# Patient Record
Sex: Female | Born: 1985 | Race: White | Hispanic: No | Smoking: Former smoker
Health system: Southern US, Community
[De-identification: ages and names within clinical notes are randomized; demographics above are authoritative.]

## PROBLEM LIST (undated history)

## (undated) DIAGNOSIS — D069 Carcinoma in situ of cervix, unspecified: Secondary | ICD-10-CM

## (undated) DIAGNOSIS — Z9889 Other specified postprocedural states: Secondary | ICD-10-CM

## (undated) DIAGNOSIS — F32A Depression, unspecified: Secondary | ICD-10-CM

## (undated) DIAGNOSIS — F319 Bipolar disorder, unspecified: Secondary | ICD-10-CM

## (undated) DIAGNOSIS — F419 Anxiety disorder, unspecified: Secondary | ICD-10-CM

## (undated) DIAGNOSIS — J45909 Unspecified asthma, uncomplicated: Secondary | ICD-10-CM

## (undated) DIAGNOSIS — O3442 Maternal care for other abnormalities of cervix, second trimester: Secondary | ICD-10-CM

## (undated) DIAGNOSIS — F329 Major depressive disorder, single episode, unspecified: Secondary | ICD-10-CM

## (undated) DIAGNOSIS — R87629 Unspecified abnormal cytological findings in specimens from vagina: Secondary | ICD-10-CM

## (undated) HISTORY — DX: Unspecified asthma, uncomplicated: J45.909

## (undated) HISTORY — DX: Anxiety disorder, unspecified: F41.9

## (undated) HISTORY — DX: Depression, unspecified: F32.A

## (undated) HISTORY — PX: CHOLECYSTECTOMY: SHX55

## (undated) HISTORY — PX: URETERAL STENT PLACEMENT: SHX822

## (undated) HISTORY — PX: TONSILLECTOMY: SUR1361

## (undated) HISTORY — DX: Unspecified abnormal cytological findings in specimens from vagina: R87.629

## (undated) HISTORY — DX: Major depressive disorder, single episode, unspecified: F32.9

## (undated) HISTORY — DX: Other specified postprocedural states: Z98.890

## (undated) HISTORY — DX: Maternal care for other abnormalities of cervix, second trimester: O34.42

## (undated) HISTORY — PX: TYMPANOSTOMY TUBE PLACEMENT: SHX32

---

## 2005-01-19 ENCOUNTER — Emergency Department: Payer: Self-pay | Admitting: Unknown Physician Specialty

## 2005-07-22 ENCOUNTER — Ambulatory Visit: Payer: Self-pay | Admitting: Unknown Physician Specialty

## 2005-07-24 ENCOUNTER — Observation Stay: Payer: Self-pay | Admitting: Obstetrics & Gynecology

## 2005-08-03 ENCOUNTER — Observation Stay: Payer: Self-pay

## 2005-09-01 ENCOUNTER — Observation Stay: Payer: Self-pay

## 2005-09-11 ENCOUNTER — Observation Stay: Payer: Self-pay

## 2005-09-16 ENCOUNTER — Ambulatory Visit: Payer: Self-pay | Admitting: Unknown Physician Specialty

## 2005-09-19 ENCOUNTER — Observation Stay: Payer: Self-pay | Admitting: Obstetrics & Gynecology

## 2005-10-03 ENCOUNTER — Inpatient Hospital Stay: Payer: Self-pay

## 2005-12-28 ENCOUNTER — Emergency Department: Payer: Self-pay | Admitting: Unknown Physician Specialty

## 2006-12-02 ENCOUNTER — Emergency Department: Payer: Self-pay | Admitting: Emergency Medicine

## 2006-12-02 IMAGING — CR DG ABDOMEN 3V
1 series · 4 of 4 positions shown · non-contrast
Comparison: none

REASON FOR EXAM: Pain
COMMENTS:

[Series 1: view not recorded · 0.17mm/px · 4 of 4 slices shown]
[im 1/4]
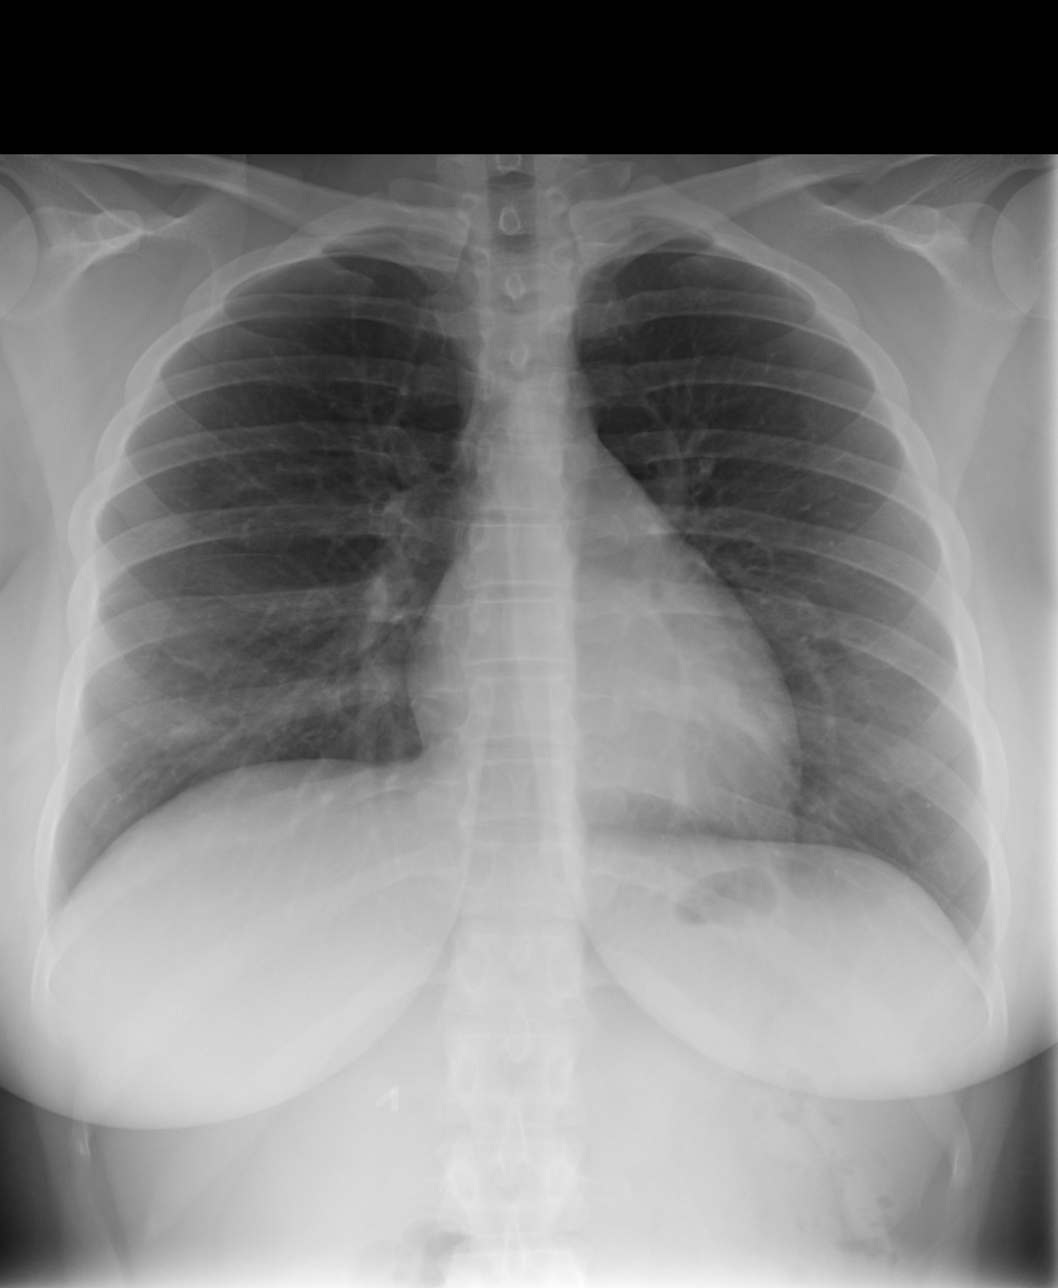
[im 2/4]
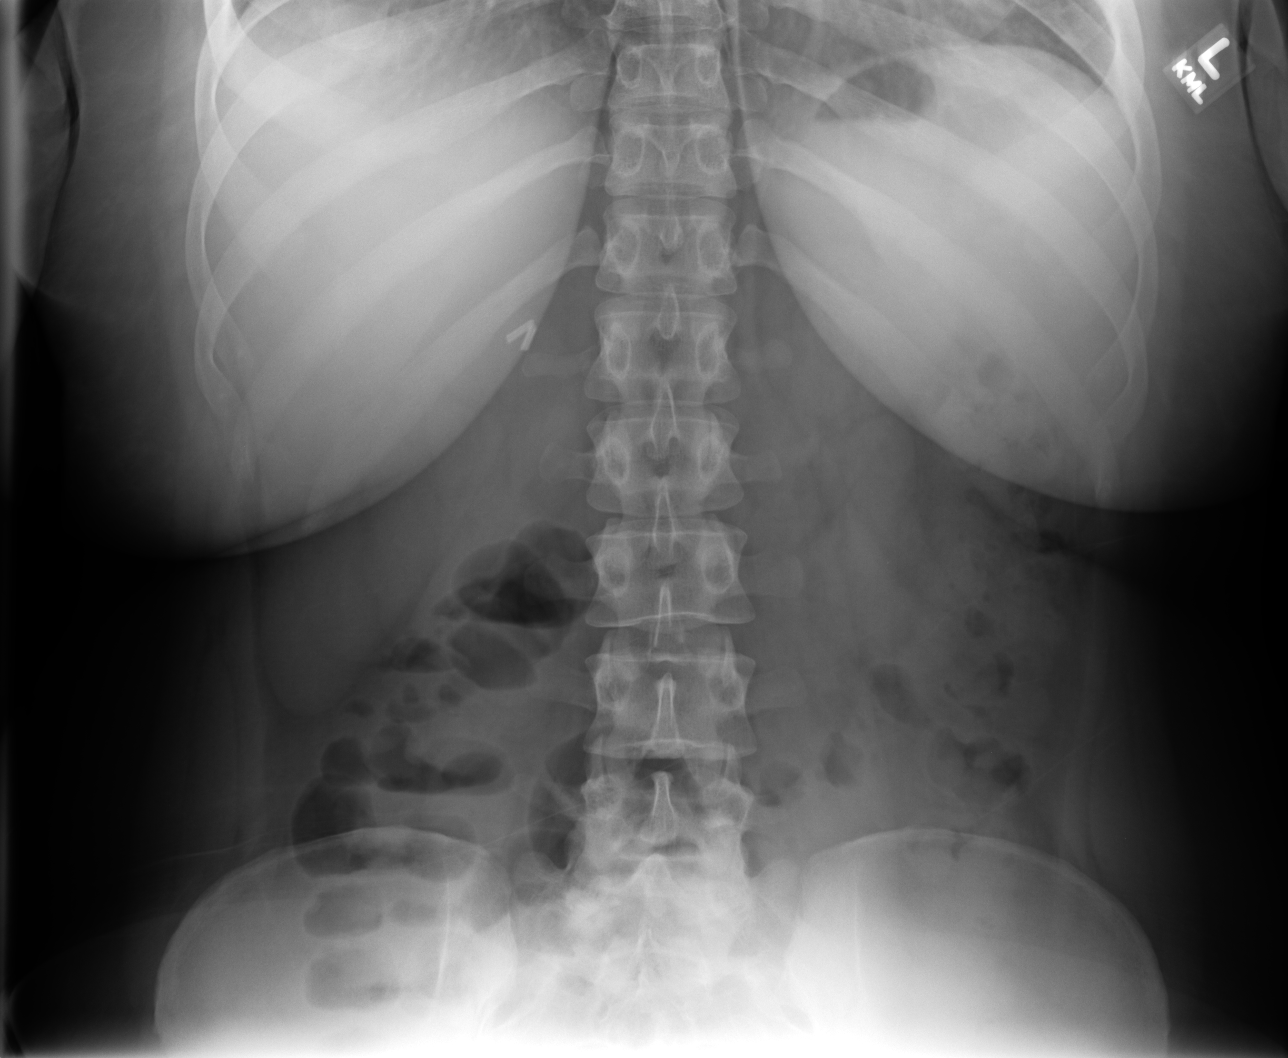
[im 3/4]
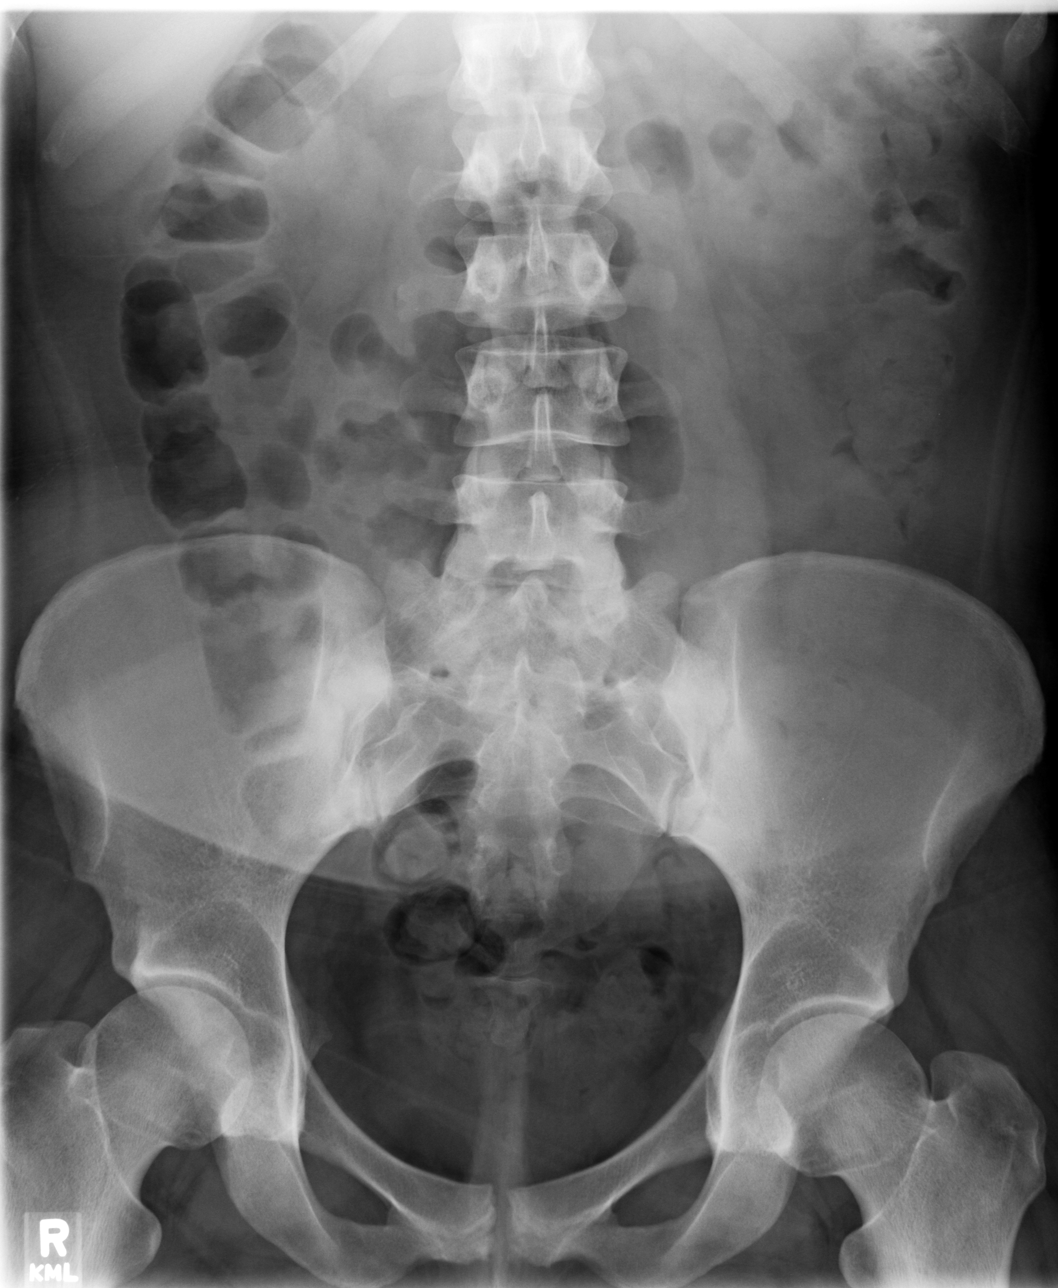
[im 4/4]
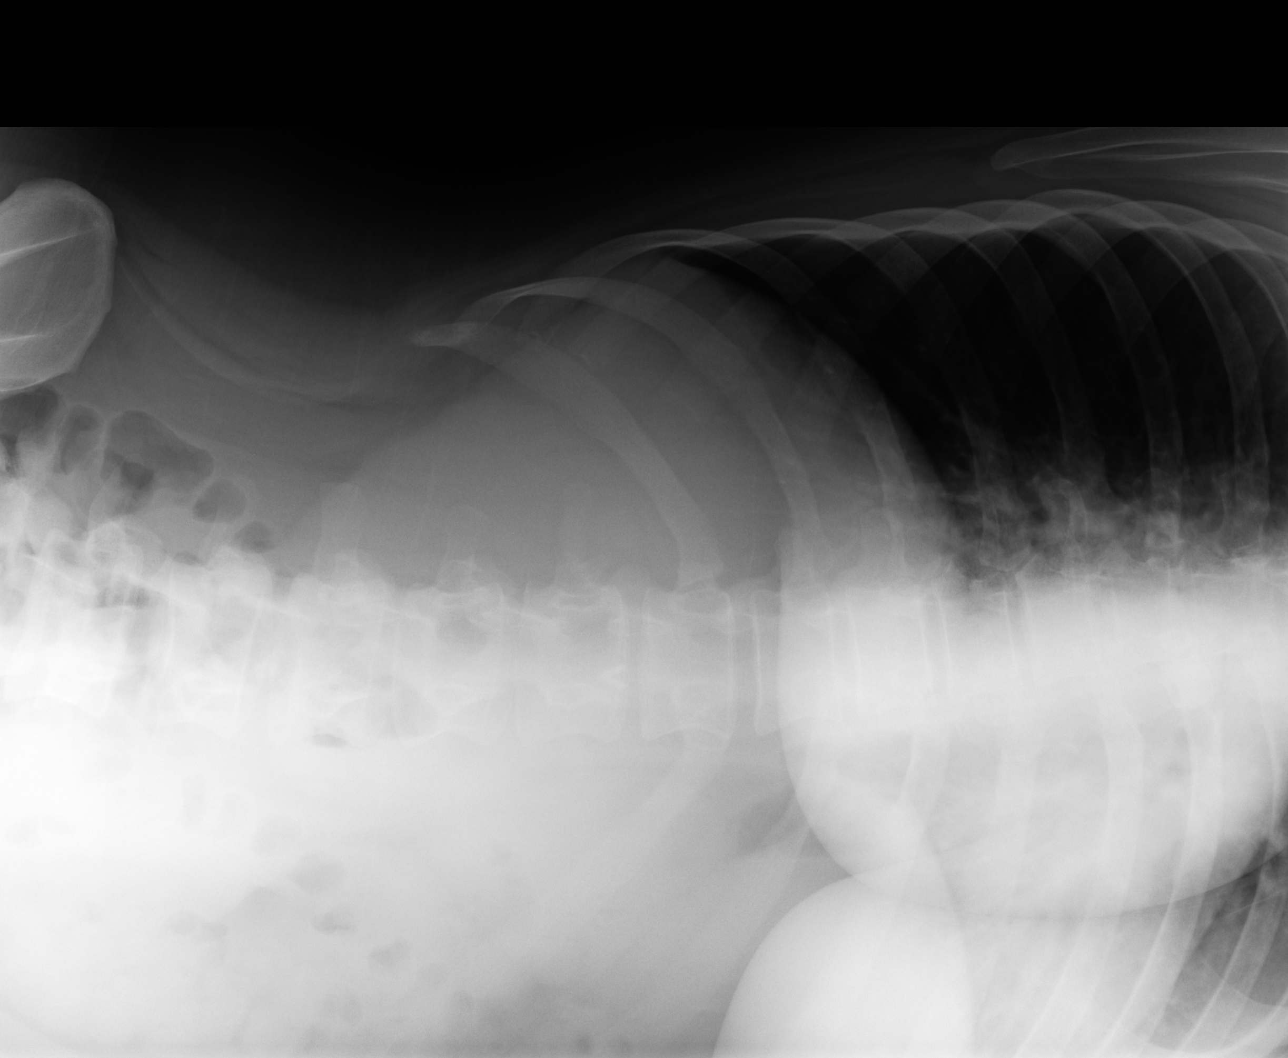

[4 of 4 positions shown; findings below may reference images not displayed]

PROCEDURE:     DXR - DXR ABDOMEN 3-WAY (INCL PA CXR)  - [DATE]  [DATE]

RESULT:     The patient is complaining of abdominal discomfort.

The chest film reveals the lungs to be adequately inflated and clear.  The
heart and mediastinal structures are within the limits of normal.  Views of
the abdomen reveal a normal bowel gas pattern.  There is no evidence of
obstruction or ileus or perforation.  There are surgical clips in the
gallbladder fossa.
IMPRESSION: I see no acute intraabdominal abnormality.

## 2007-06-05 ENCOUNTER — Emergency Department: Payer: Self-pay | Admitting: Emergency Medicine

## 2007-06-06 IMAGING — CT CT ABD-PELV W/ CM
1 of 2 series · 15 of 32 positions shown, 19 images · non-contrast
Comparison: none

REASON FOR EXAM: (1) PERIUMBILICAL PAIN; (2) PERIUMBILICAL PAIN
COMMENTS:

PROCEDURE:     CT  - CT ABDOMEN / PELVIS  W  - [DATE]  [DATE]
RESULT:
HISTORY: Periumbilical pain.

[Series 2: abd/pel w · axial · 0.84mm/px · z∈[-514,-58]mm · 15 of 63 slices shown, 19 images]
[im 3/63  soft-tissue]
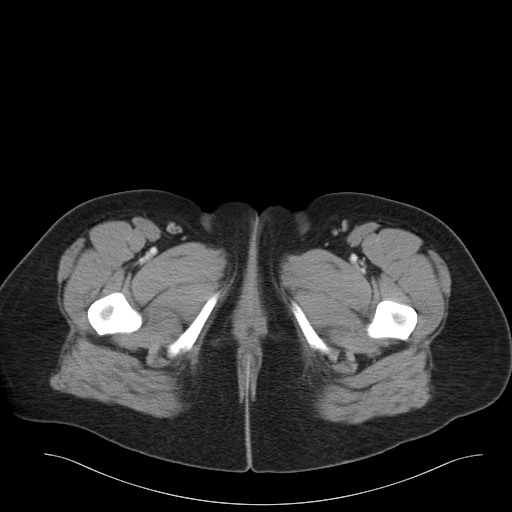
[im 3/63  bone]
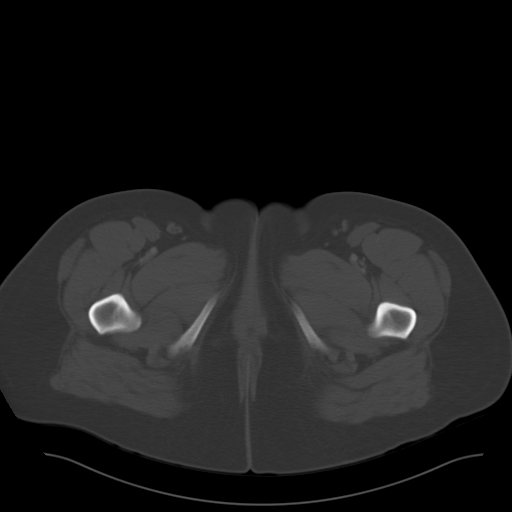
[im 8/63  soft-tissue]
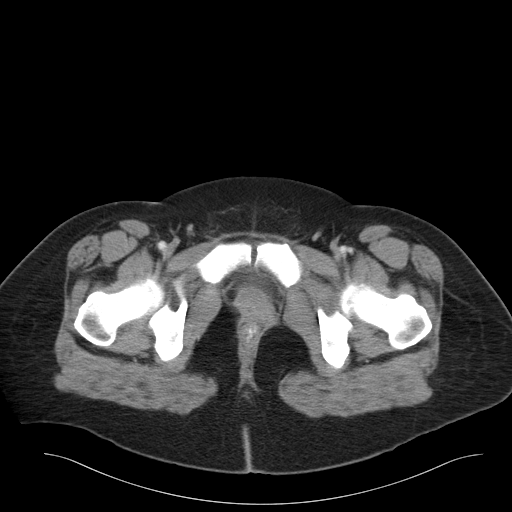
[im 13/63  soft-tissue]
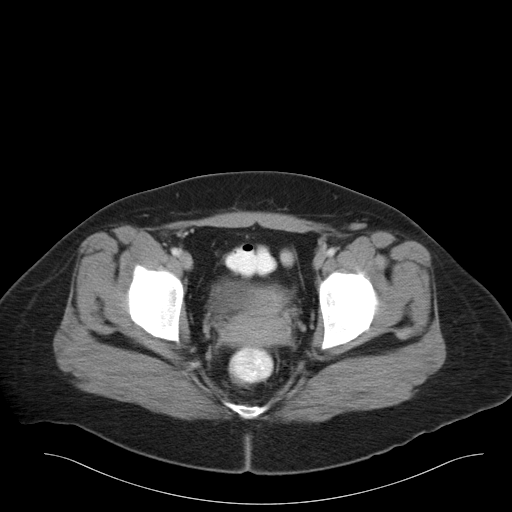
[im 19/63  soft-tissue]
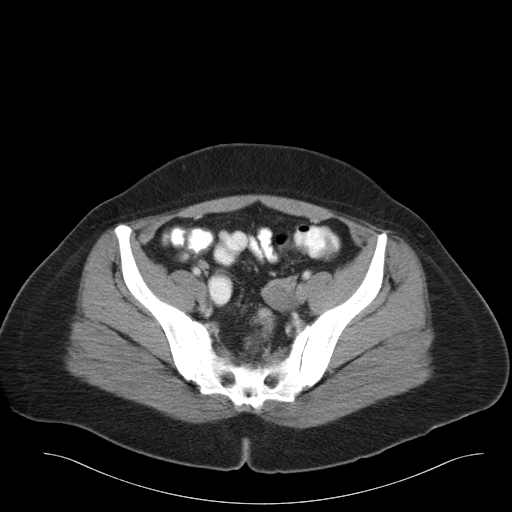
[im 21/63  soft-tissue]
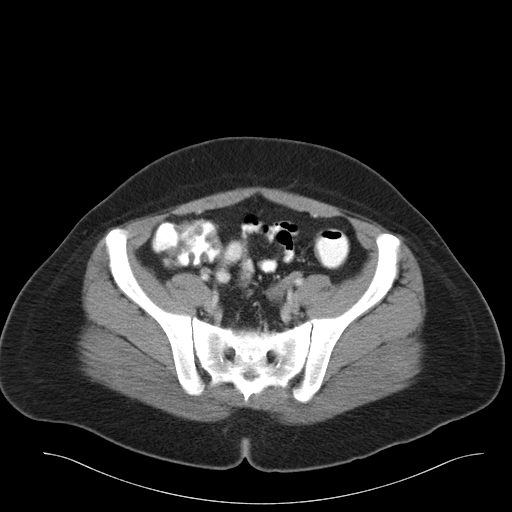
[im 26/63  soft-tissue]
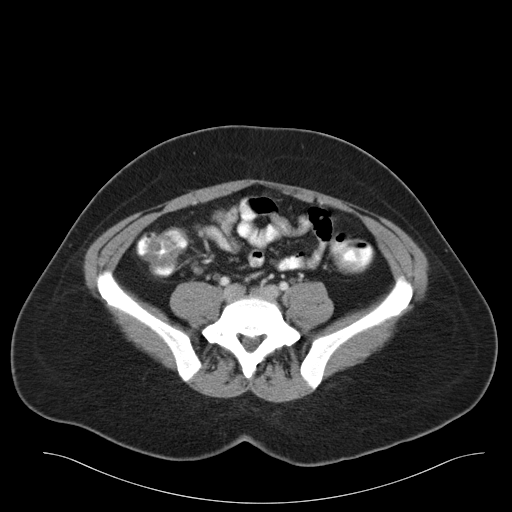
[im 32/63  soft-tissue]
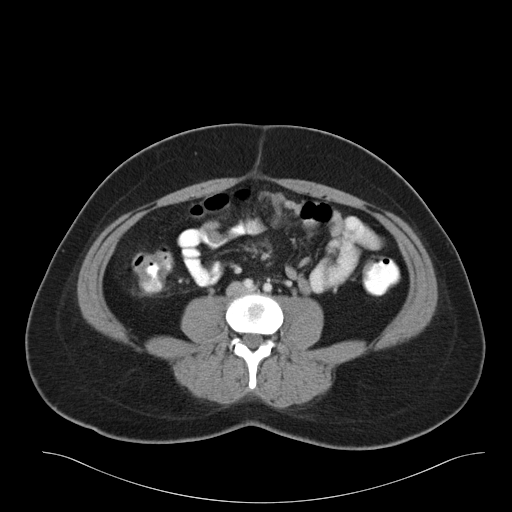
[im 37/63  soft-tissue]
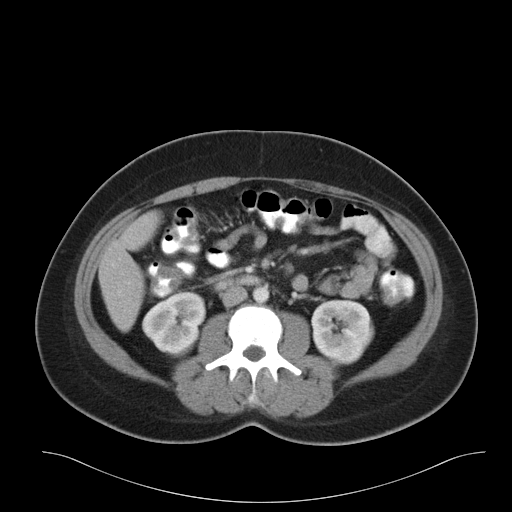
[im 42/63  soft-tissue]
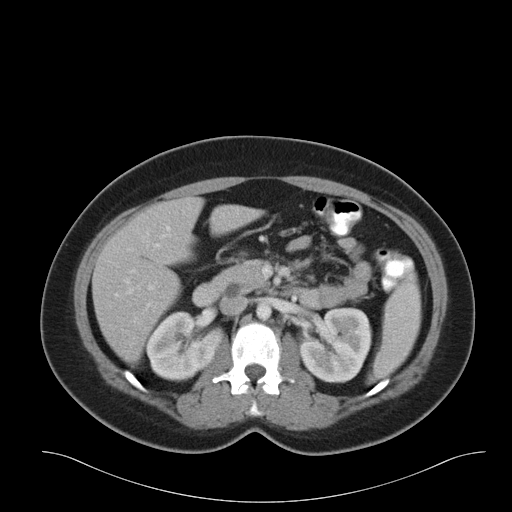
[im 42/63  bone]
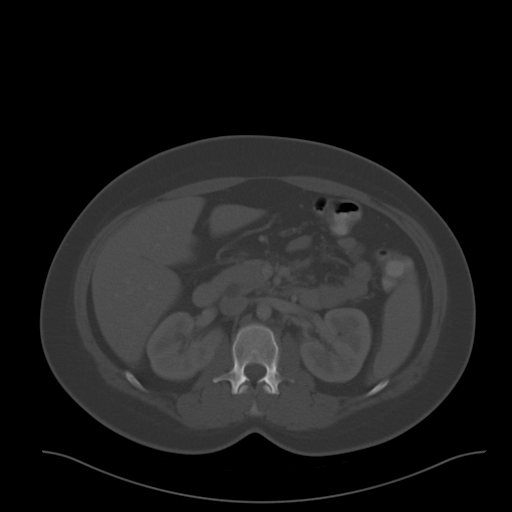
[im 44/63  soft-tissue]
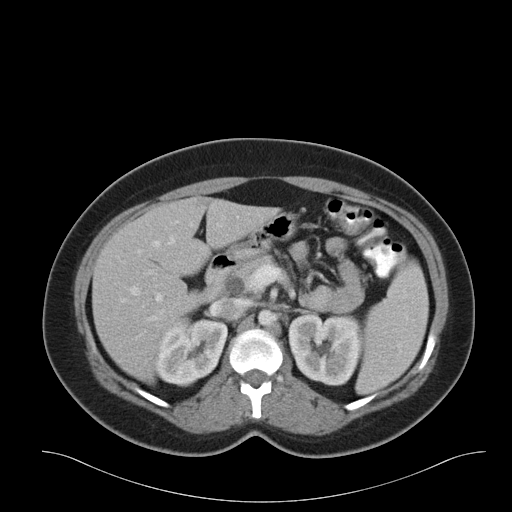
[im 50/63  soft-tissue]
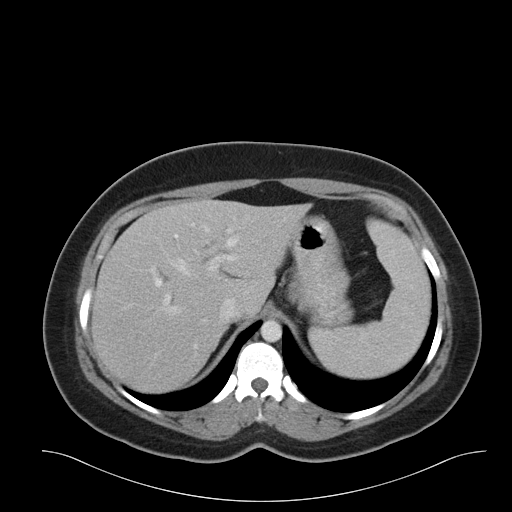
[im 52/63  lung]
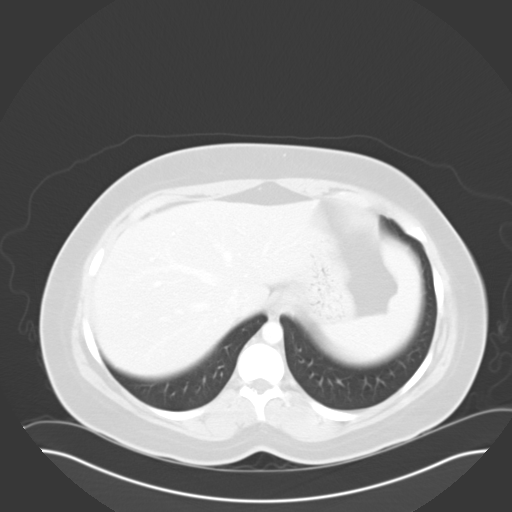
[im 55/63  soft-tissue]
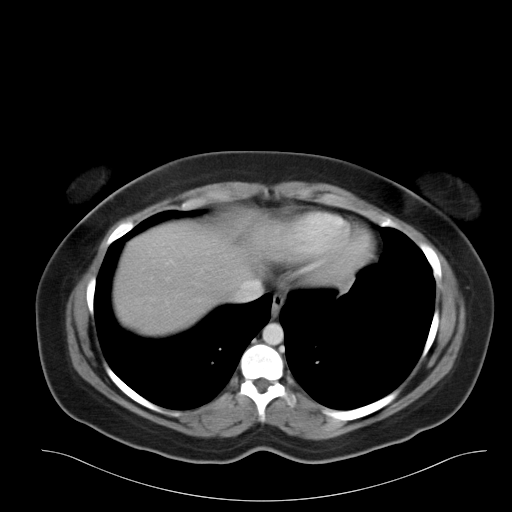
[im 55/63  lung]
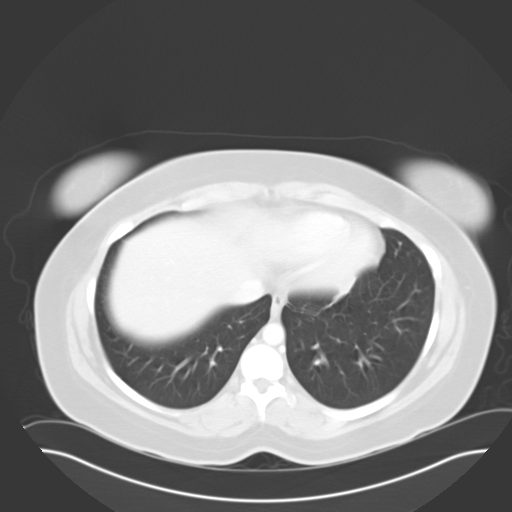
[im 57/63  lung]
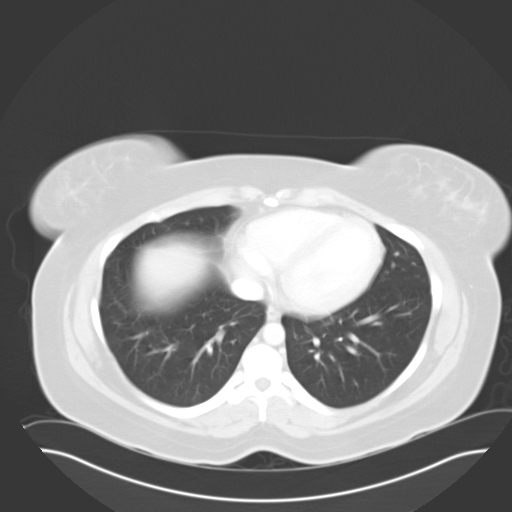
[im 60/63  soft-tissue]
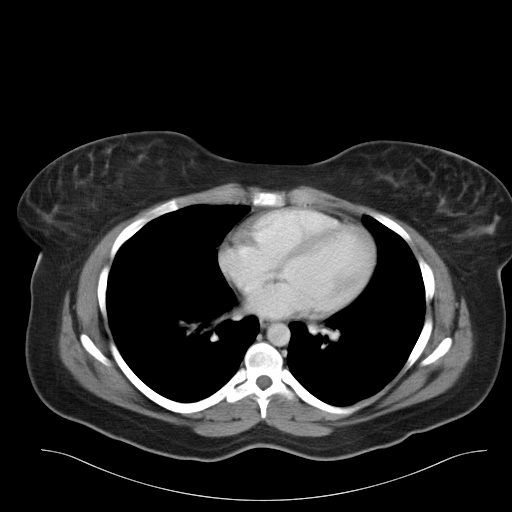
[im 60/63  lung]
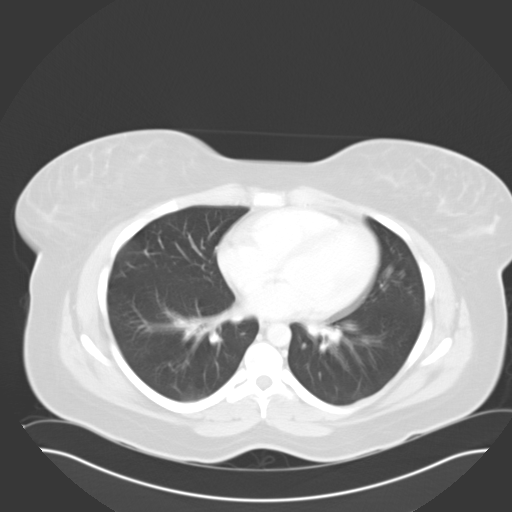

[15 of 32 positions shown; findings below may reference images not displayed]

COMPARISON STUDIES: No recent.

PROCEDURE AND FINDINGS: The liver and spleen are normal.  The pancreas is
normal. The patient has had a prior cholecystectomy. There is mild
prominence of the biliary system. Clinical correlation is suggested to
exclude hyperbilirubinemia. No focal renal abnormalities are noted. There is
no bowel distention.  The retroperitoneum is unremarkable.  No pancreatic
lesions are noted. The RIGHT lower quadrant is unremarkable.  The appendix
is poorly visualized.  Ileocecal valve is normal. The pericecal region is
normal.  Prominence of the RIGHT adnexa is noted.  Although this could be
appendical in nature RIGHT adnexal pathology cannot be excluded. If symptoms
persist pelvic ultrasound should be considered.  Pregnancy test should be
considered. The LEFT adnexa is also slightly prominent.  There is a trace
amount of fluid in the cul-de-sac.
IMPRESSION: 1)Bilateral adnexal mild prominence with trace amount of fluid in the
cul-de-sac.  Pregnancy test and further evaluation with pelvic ultrasound
can be obtained if clinically indicated.  The appendix is not well
visualized.

2)Prominence of the biliary system.  The patient has had a prior
cholecystectomy.  Clinical correlation is suggested to exclude
hyperbilirubinemia.  If need be we can perform MRCP and MRI of the pancreas
for further evaluation.

The initial report was faxed to the Emergency Room physician at the time of
the study.

## 2007-08-06 ENCOUNTER — Emergency Department: Payer: Self-pay | Admitting: Unknown Physician Specialty

## 2007-08-24 ENCOUNTER — Emergency Department: Payer: Self-pay | Admitting: Emergency Medicine

## 2008-02-10 ENCOUNTER — Emergency Department: Payer: Self-pay | Admitting: Emergency Medicine

## 2009-01-17 ENCOUNTER — Emergency Department: Payer: Self-pay | Admitting: Emergency Medicine

## 2010-03-09 ENCOUNTER — Emergency Department: Payer: Self-pay | Admitting: Emergency Medicine

## 2010-03-10 IMAGING — CT CT ABD-PELV W/O CM
1 of 2 series · 15 of 32 positions shown, 19 images · non-contrast
Comparison: none

REASON FOR EXAM: (1) epigastric pain radiating to back w/ vomiting; (2)
epigastric pain radiating
COMMENTS:

[Series 2: 3mm soft tissue · axial · 0.74mm/px · z∈[+335,+797]mm · 15 of 169 slices shown, 19 images]
[im 8/169  soft-tissue]
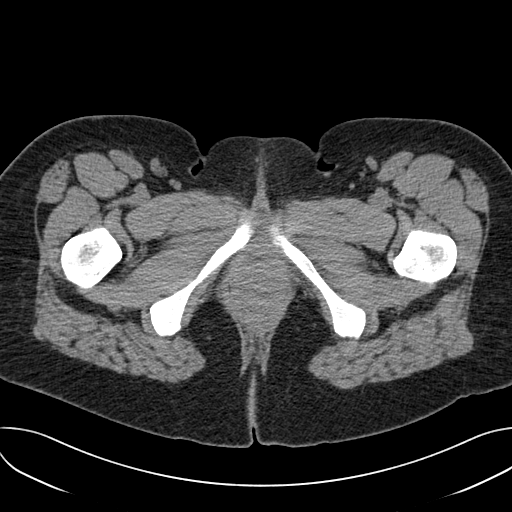
[im 8/169  bone]
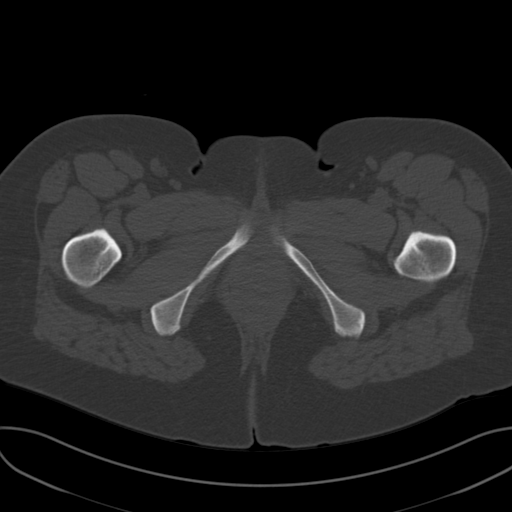
[im 22/169  soft-tissue]
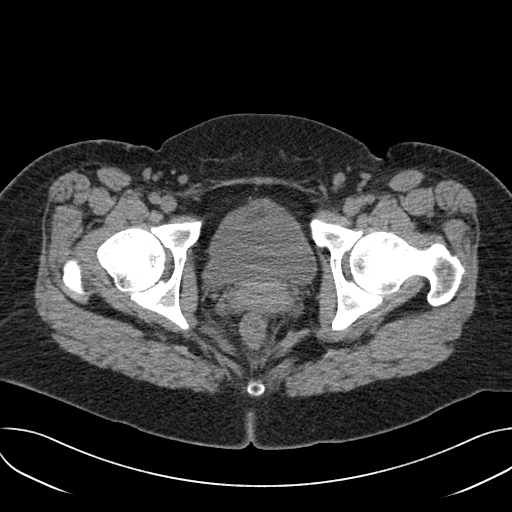
[im 36/169  soft-tissue]
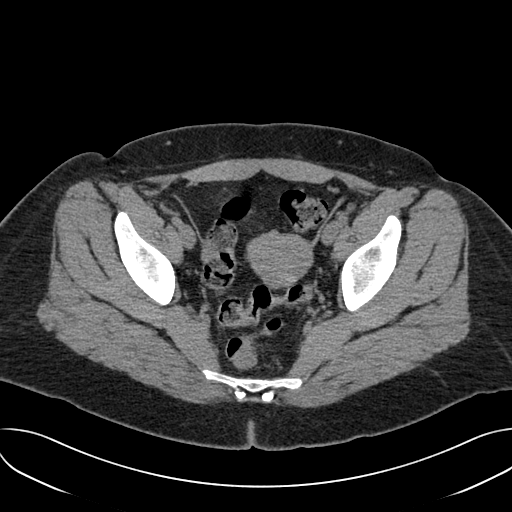
[im 50/169  soft-tissue]
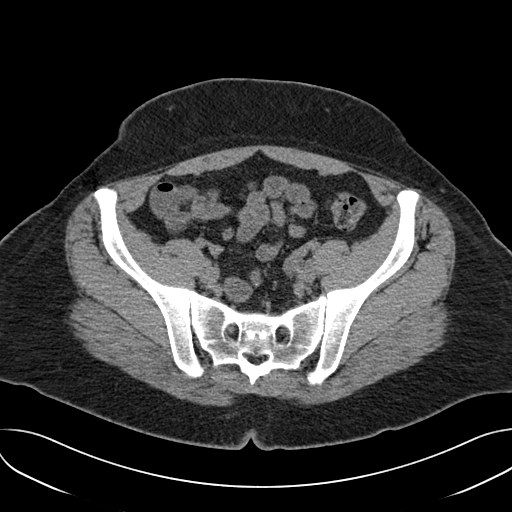
[im 57/169  soft-tissue]
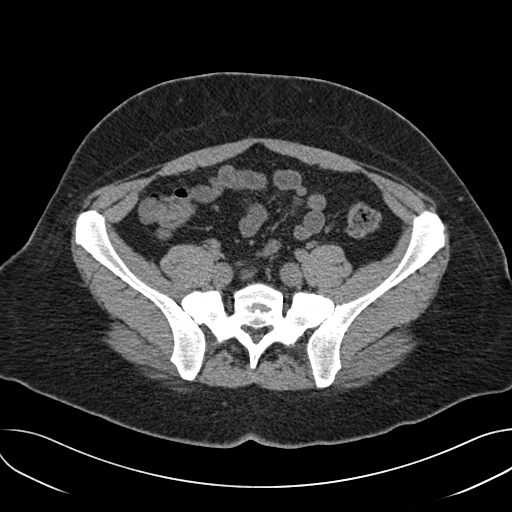
[im 71/169  soft-tissue]
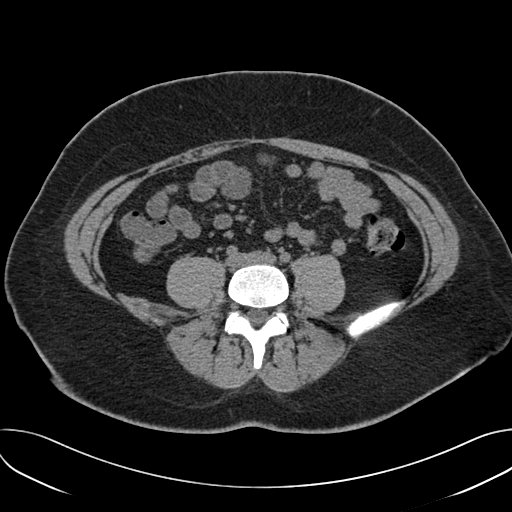
[im 85/169  soft-tissue]
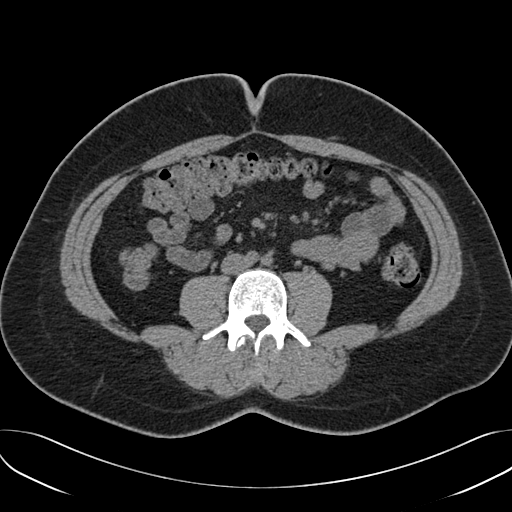
[im 99/169  soft-tissue]
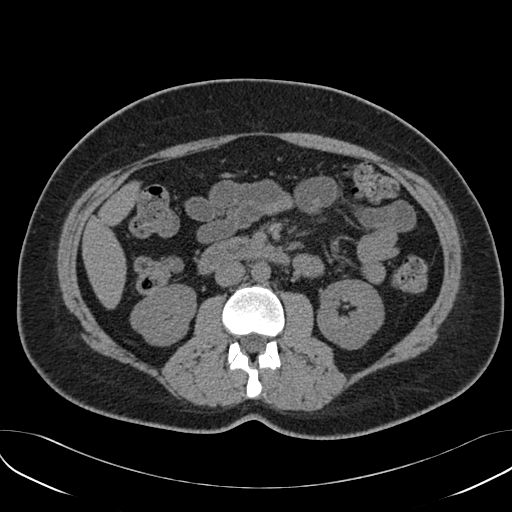
[im 113/169  soft-tissue]
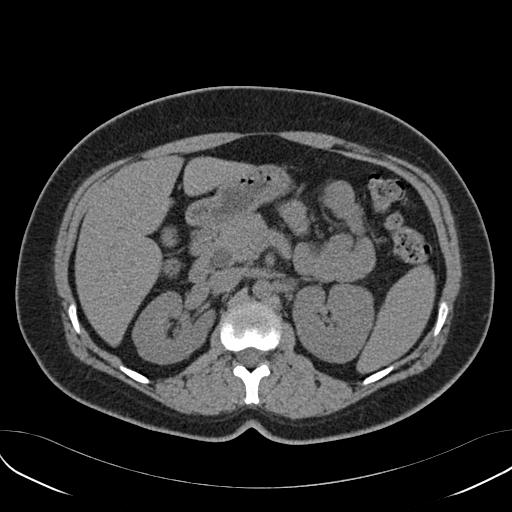
[im 113/169  bone]
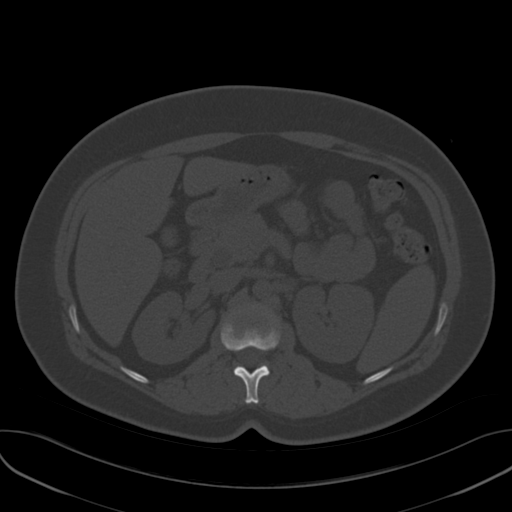
[im 120/169  soft-tissue]
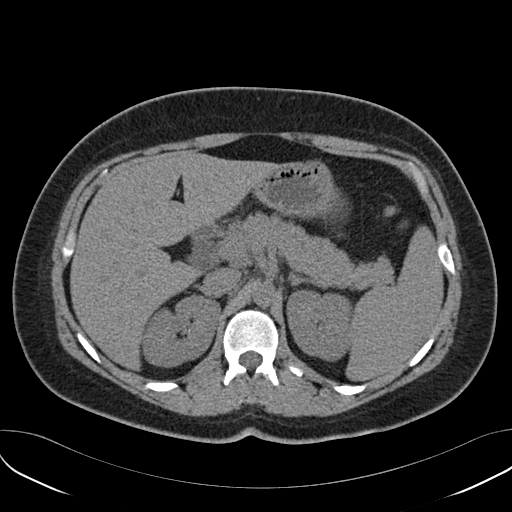
[im 134/169  soft-tissue]
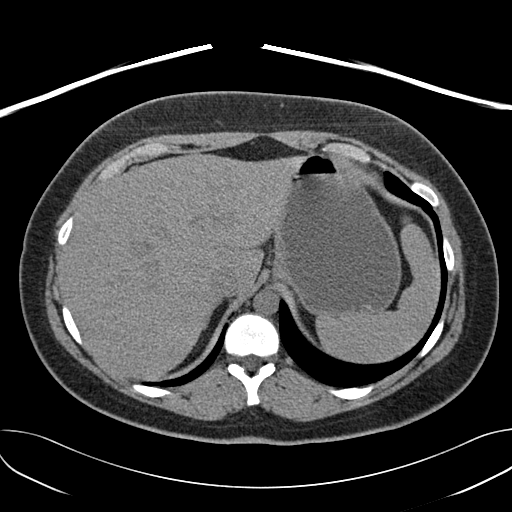
[im 141/169  lung]
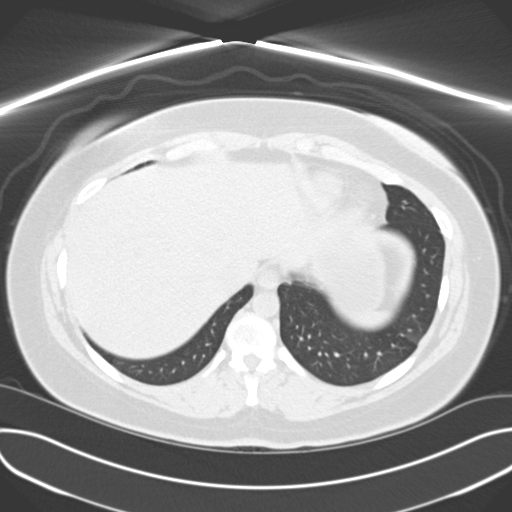
[im 148/169  soft-tissue]
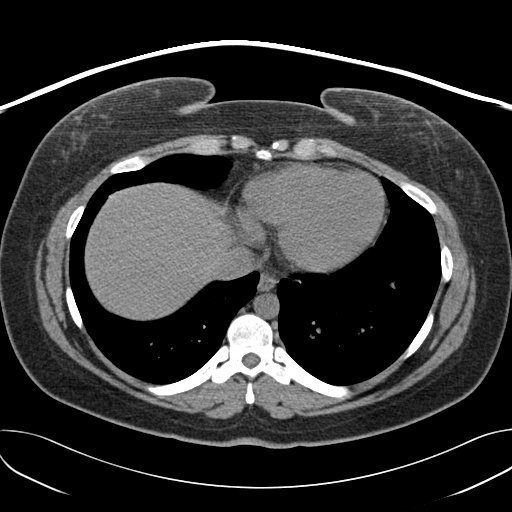
[im 148/169  lung]
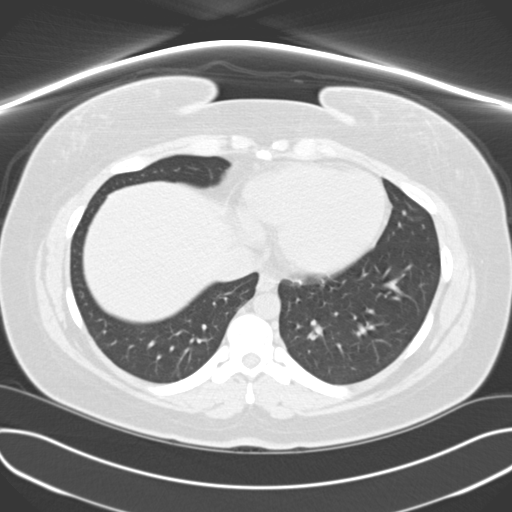
[im 155/169  lung]
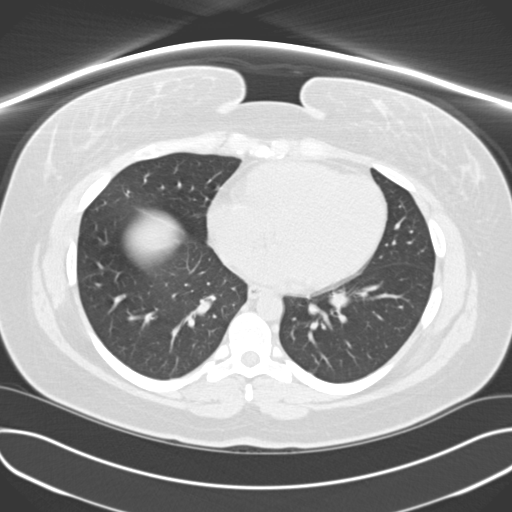
[im 162/169  soft-tissue]
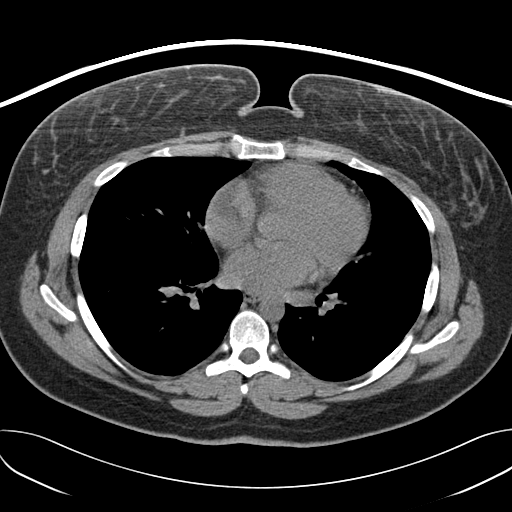
[im 162/169  lung]
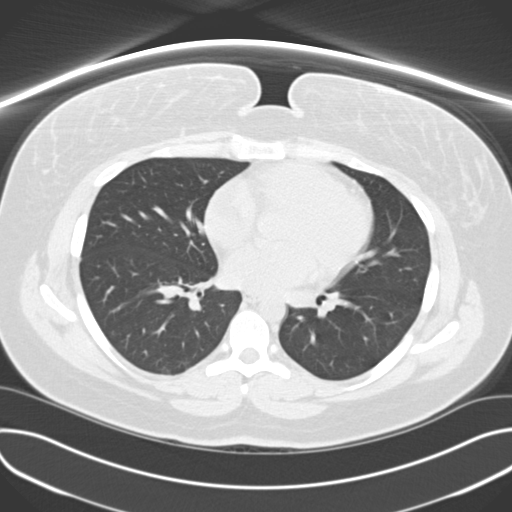

[15 of 32 positions shown; findings below may reference images not displayed]

PROCEDURE:     CT  - CT ABDOMEN AND PELVIS W[DATE]  [DATE]

RESULT:     Comparison is made to prior study dated [DATE].

Helical non-contrast 3 mm sections were obtained from the lung bases through
the pubic symphysis.

Evaluation of the lung bases demonstrates no gross abnormalities.

Non-contrast evaluation of the liver, spleen, adrenals, pancreas, and
kidneys is unremarkable. Small subcentimeter lymph nodes are appreciated
scattered throughout the mesentery. There is no evidence of an abdominal
aortic aneurysm. There is no evidence of bowel obstruction, nor secondary
signs reflecting enteritis, colitis, diverticulitis, nor appendicitis. There
is no CT evidence of renal calculi, ureteral calculi, hydronephrosis, nor
ureteral dilatation. A moderate amount of stool is appreciated throughout
the colon.
IMPRESSION: 1. Nonspecific finding of multiple small lymph nodes scattered throughout
the mesentery. In the proper clinical setting, mesenteric adenitis cannot be
excluded. Clinical correlation recommended.

Dr. LEEM of the Emergency Department was informed of these findings via a
preliminary faxed report dated [DATE] at [DATE] a.m. Central Time.

## 2011-04-18 ENCOUNTER — Emergency Department: Payer: Self-pay | Admitting: Internal Medicine

## 2011-04-18 IMAGING — CT CT ABD-PELV W/O CM
1 of 2 series · 14 of 32 positions shown, 18 images · non-contrast
Comparison: none

REASON FOR EXAM: (1) llq pain, r/o kidney stone or ovarian cyst; (2) as
above
COMMENTS:   LMP: Two weeks ago

[Series 2: stone · axial · 0.74mm/px · z∈[-696,-270]mm · 14 of 162 slices shown, 18 images]
[im 13/162  soft-tissue]
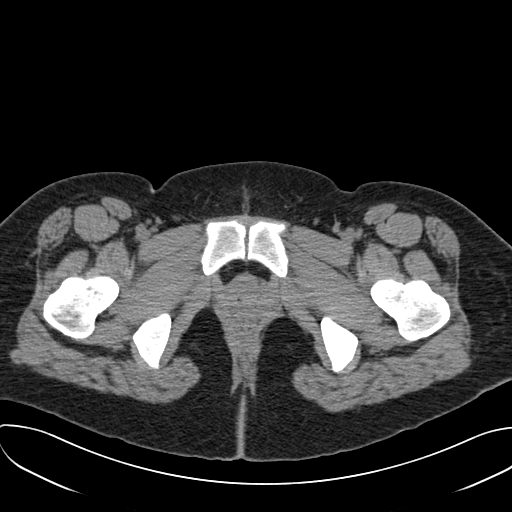
[im 13/162  bone]
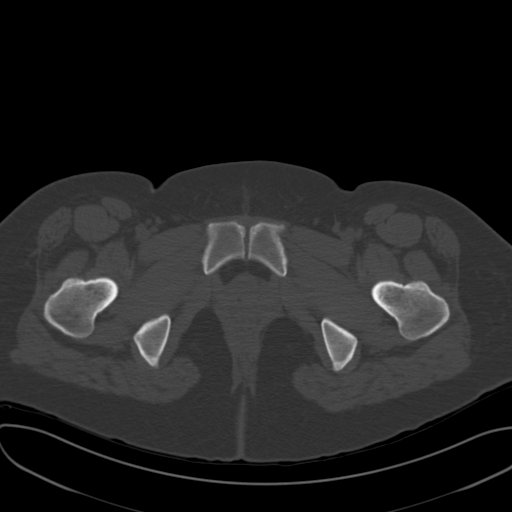
[im 25/162  soft-tissue]
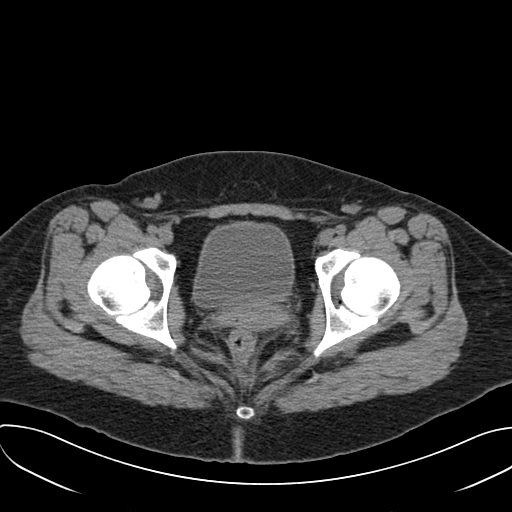
[im 38/162  soft-tissue]
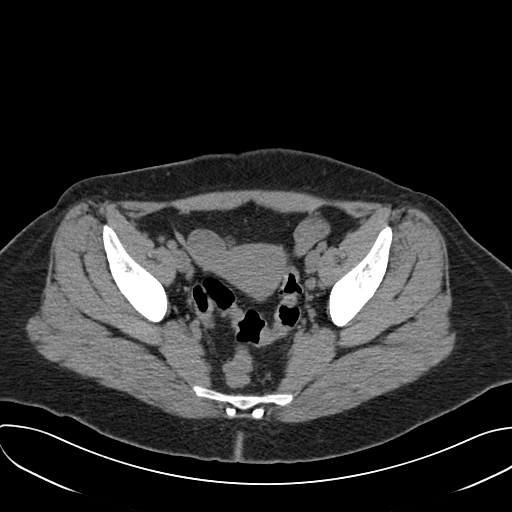
[im 50/162  soft-tissue]
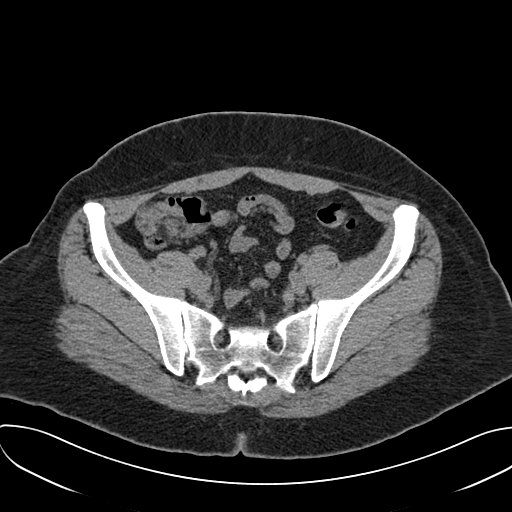
[im 62/162  soft-tissue]
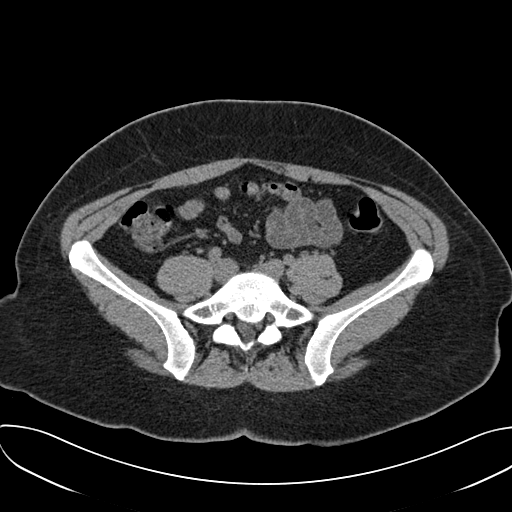
[im 75/162  soft-tissue]
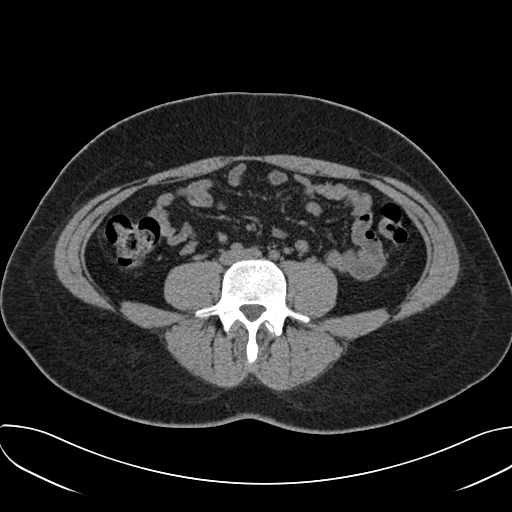
[im 87/162  soft-tissue]
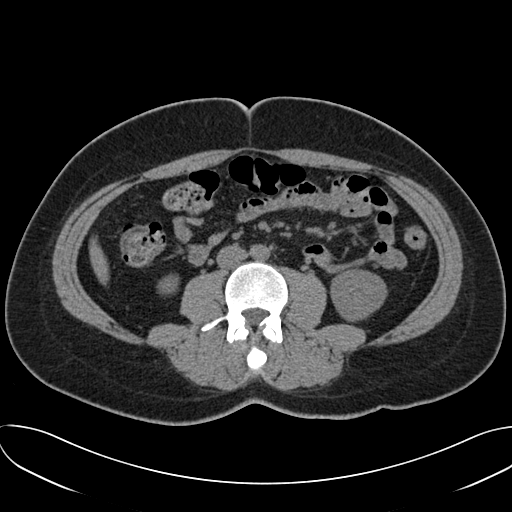
[im 100/162  soft-tissue]
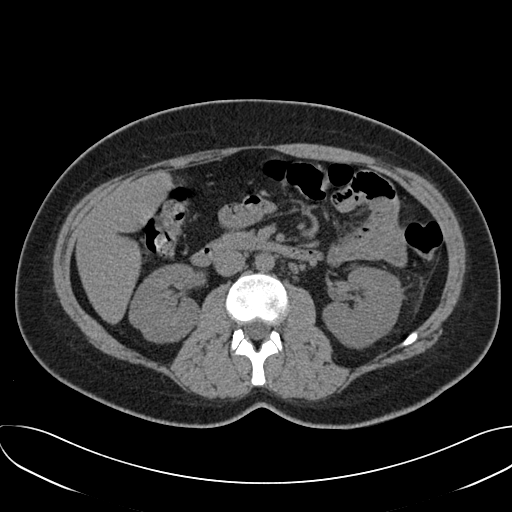
[im 112/162  soft-tissue]
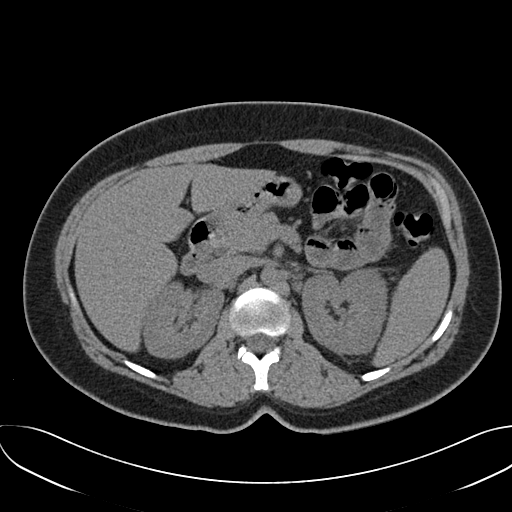
[im 112/162  bone]
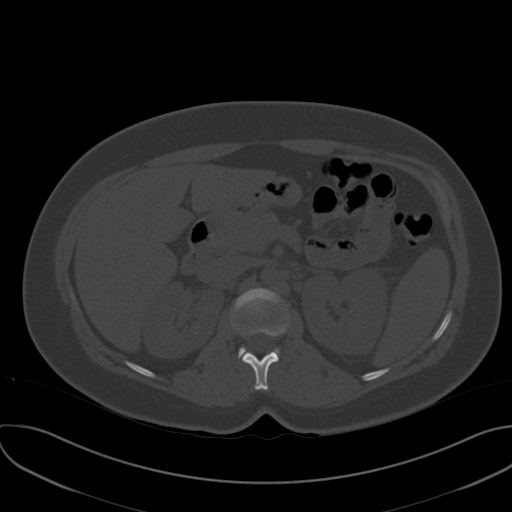
[im 124/162  soft-tissue]
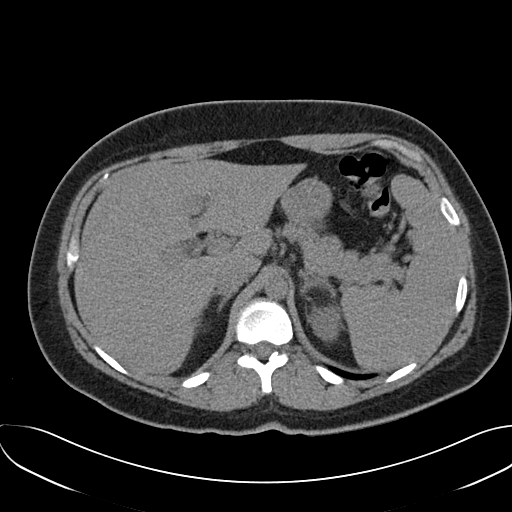
[im 137/162  soft-tissue]
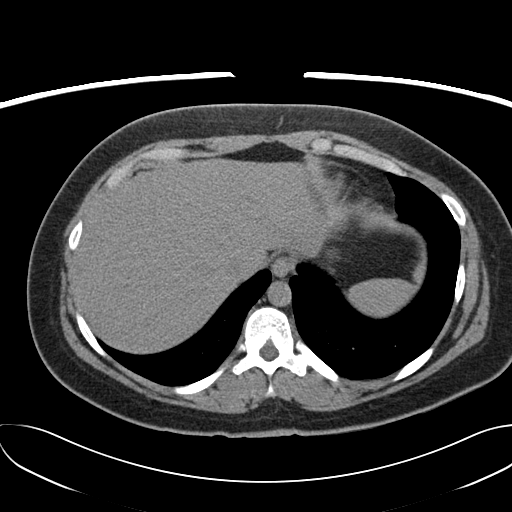
[im 137/162  lung]
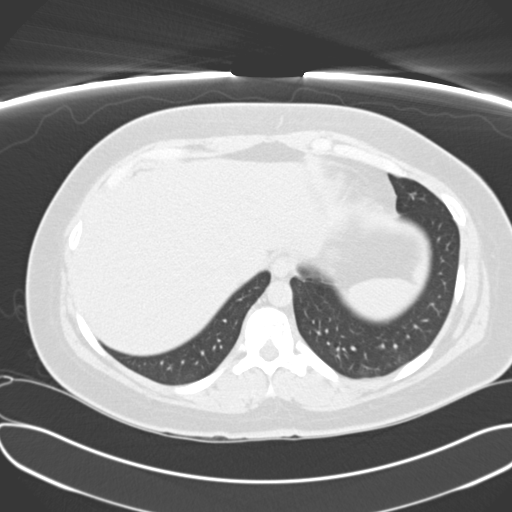
[im 143/162  lung]
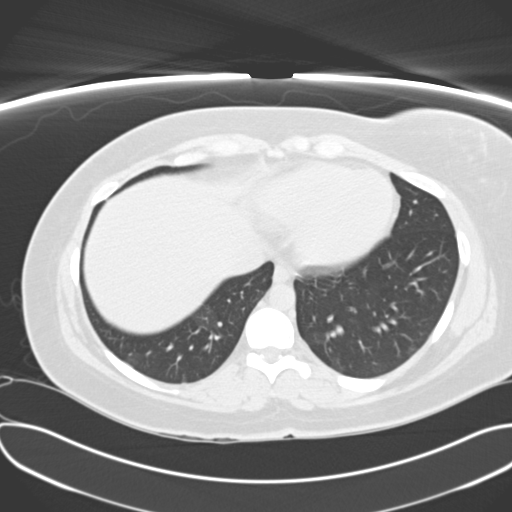
[im 149/162  soft-tissue]
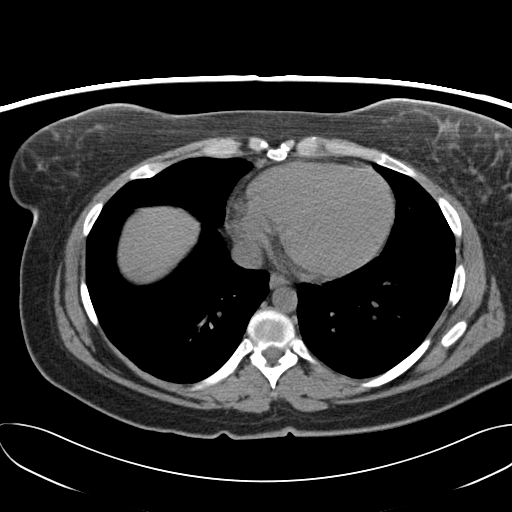
[im 149/162  lung]
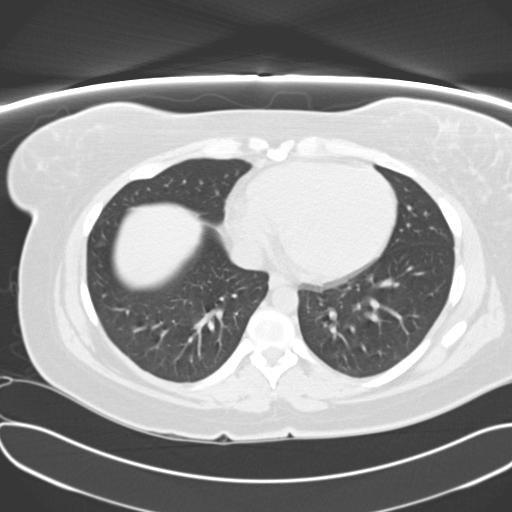
[im 155/162  lung]
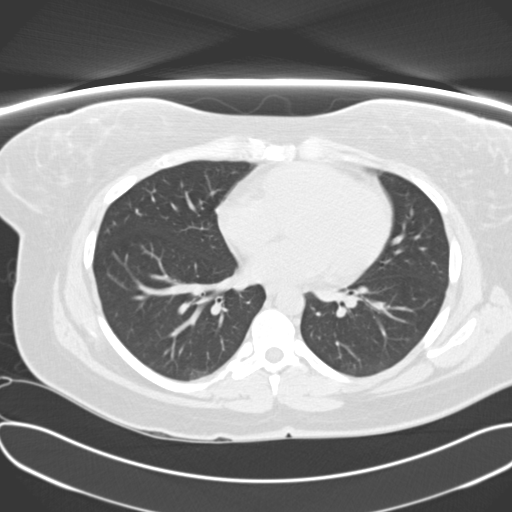

[14 of 32 positions shown; findings below may reference images not displayed]

PROCEDURE:     CT  - CT ABDOMEN AND PELVIS W[DATE]  [DATE]

RESULT:     Axial noncontrast CT scanning was performed through the abdomen
and pelvis at 3 mm intervals and slice thicknesses. Review of multiplanar
reconstructed images was performed separately on the VIA monitor.

The left kidney appears larger than the right and slightly increased density
is noted in the perinephric fat. There are no calcified kidney stones on the
left and there is no evidence of hydronephrosis. On the right there is a new
7 mm diameter stone not producing obstruction in the renal pelvis. There is
an approximately 2 mm diameter lower pole stone on the right as well which
is also new. The the ureters and urinary bladder exhibit no evidence of
stones. The ureterovesical junctions are grossly normal. The uterus and
adnexal structures are within the limits of normal for age. There is no free
fluid in the pelvis.

The unopacified loops of small and large bowel exhibit no acute abnormality.
There is a structure demonstrated which may reflect a normal appendix seen
on image 113. I do not see pathologic sized mesenteric lymph nodes. The
numerous mesenteric nodes demonstrated previously in the mid upper abdomen
are less conspicuous today. The gallbladder is surgically absent. The spleen
is mildly enlarged but this is not entirely new. It measures 14.3 cm in
greatest AP dimension. The adrenal glands, pancreas, and partially distended
stomach are normal in appearance. The liver exhibits no focal mass nor
ductal dilation.

The lung bases are clear. The lumbar vertebral bodies are preserved in
height.
IMPRESSION: 1. There is mild enlargement of the left kidney and there is mildly
increased density in the perinephric fat. This may reflect pyelonephritis.
Alternatively this could indicate recent passage of a stone with mild
residual intracapsular pressures but no calcified stone on the left or in
the bladder is seen.
2. There are nonobstructing stones in the right kidney.
3. I see no acute hepatobiliary abnormality.
4. There is stable mild splenomegaly.
5. I see no acute bowel abnormality.

## 2011-05-23 ENCOUNTER — Emergency Department: Payer: Self-pay | Admitting: Emergency Medicine

## 2011-10-03 ENCOUNTER — Emergency Department: Payer: Self-pay | Admitting: *Deleted

## 2011-10-03 IMAGING — CR DG ABDOMEN 3V
1 series · 4 of 4 positions shown · non-contrast
Comparison: none

REASON FOR EXAM: abdominal bloating, pain
COMMENTS:

PROCEDURE:     DXR - DXR ABDOMEN 3-WAY (INCL PA CXR)  - [DATE] [DATE]
RESULT:     Comparison: None

[Series 1: view not recorded · 0.17mm/px · 4 of 4 slices shown]
[im 1/4]
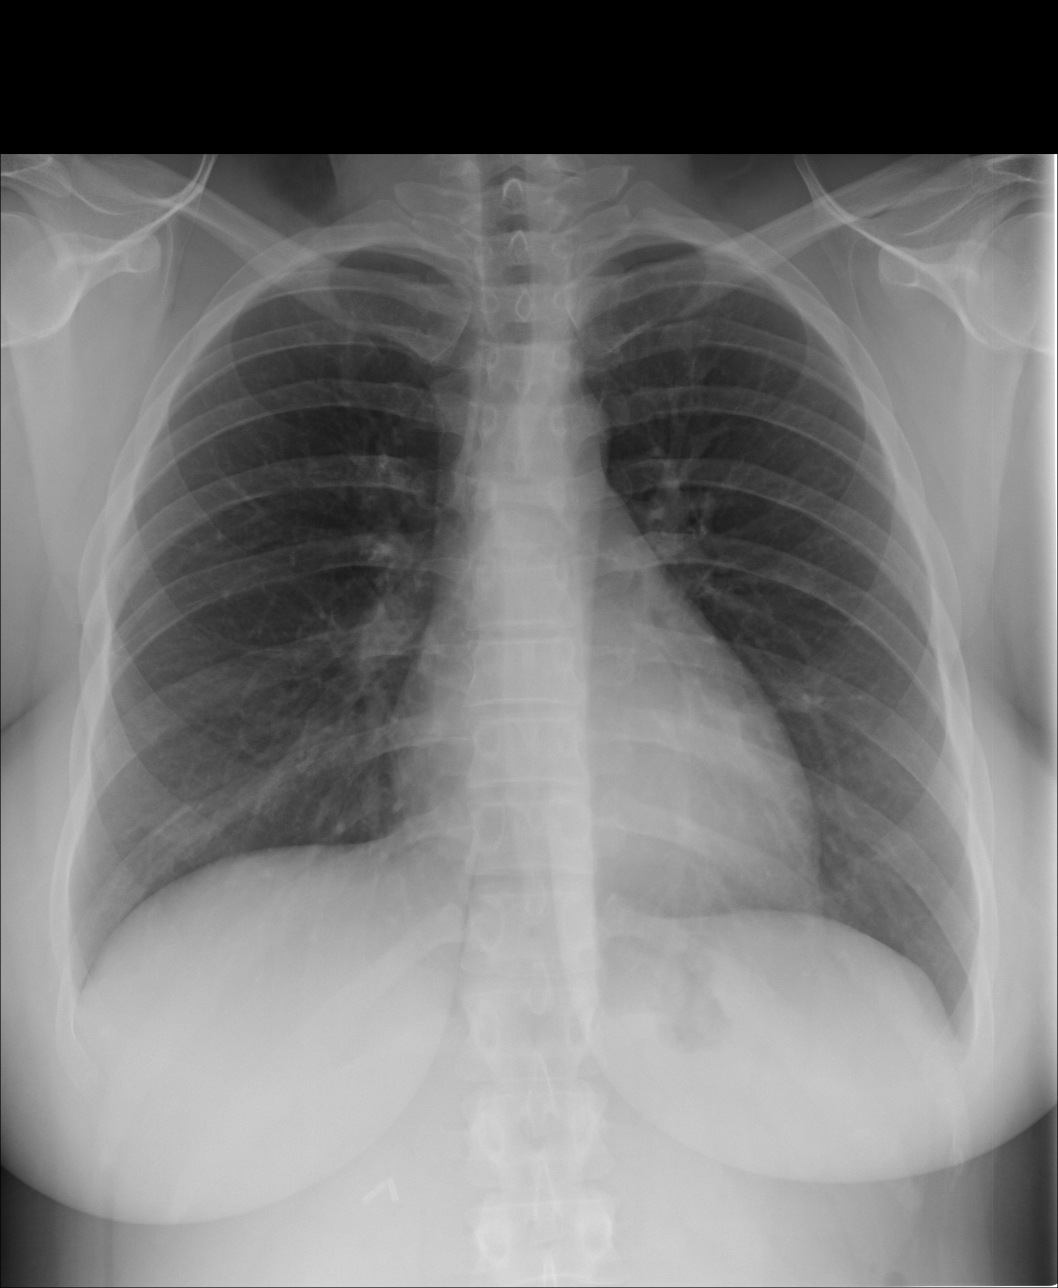
[im 2/4]
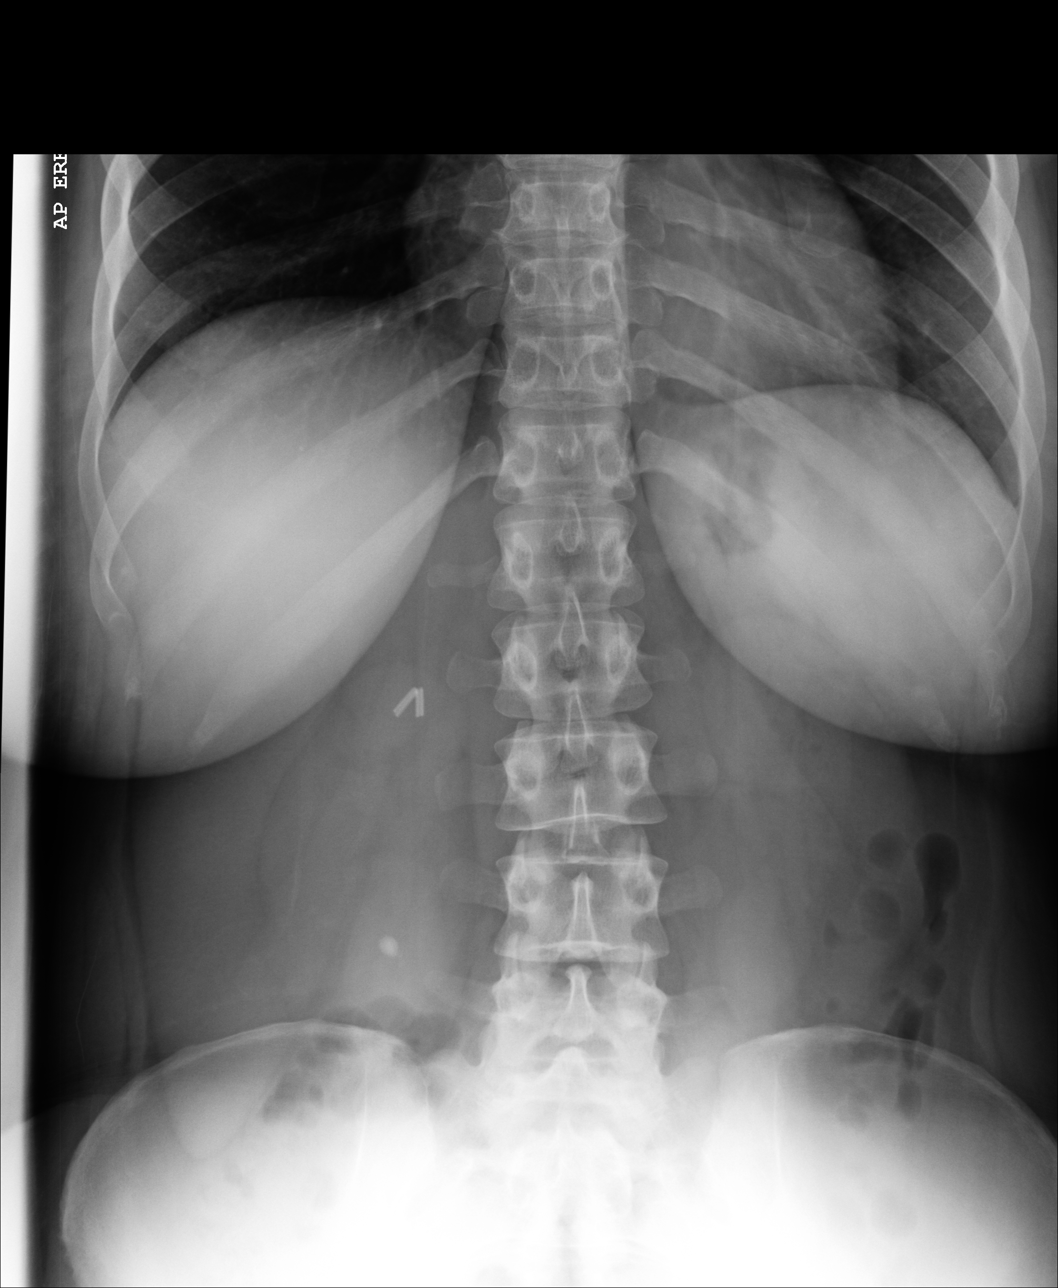
[im 3/4]
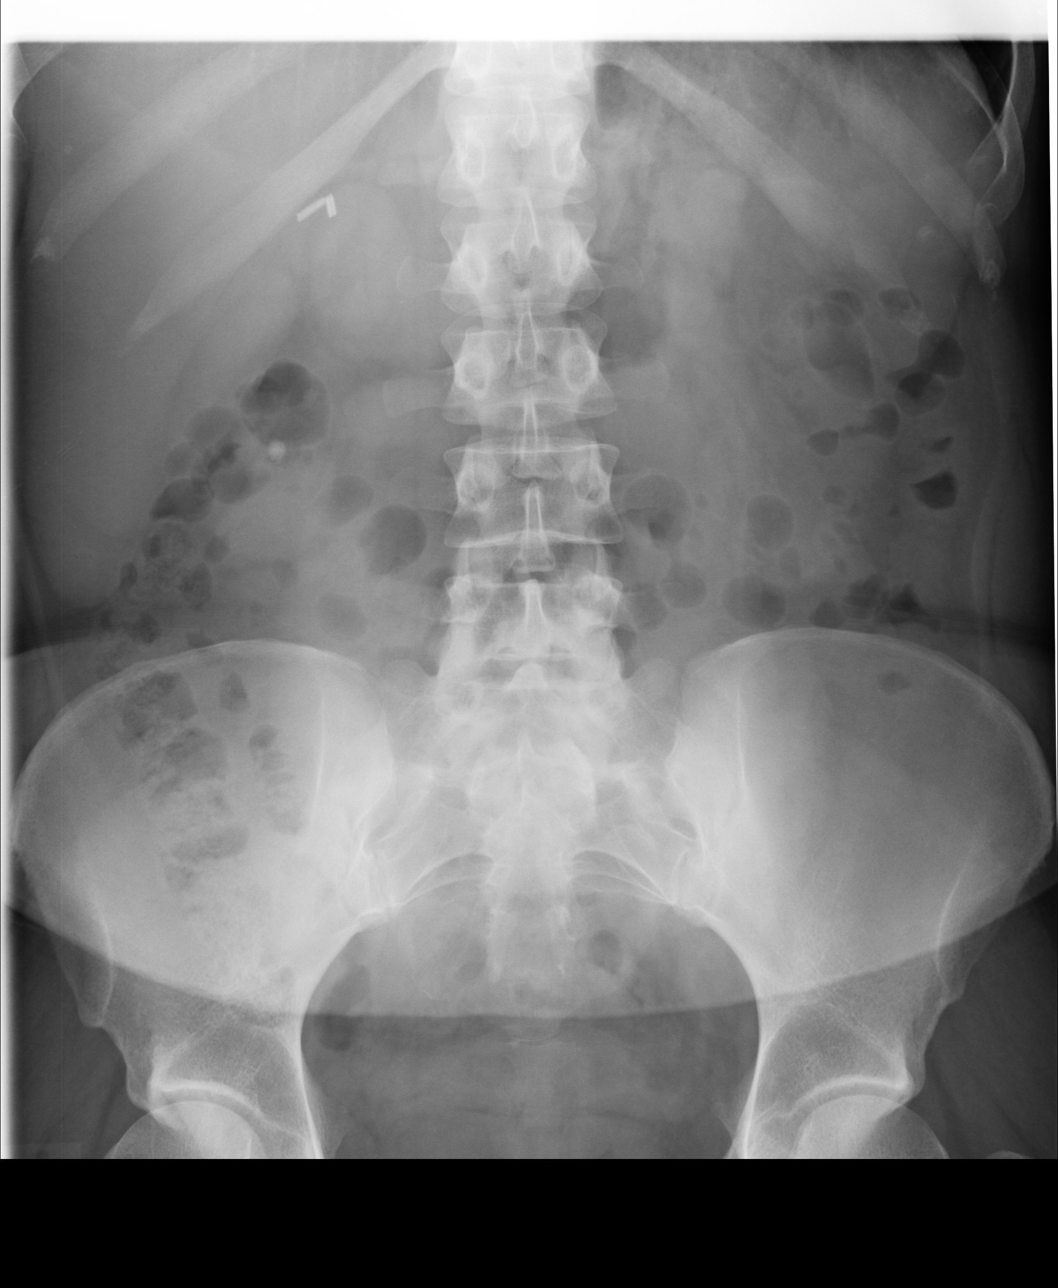
[im 4/4]
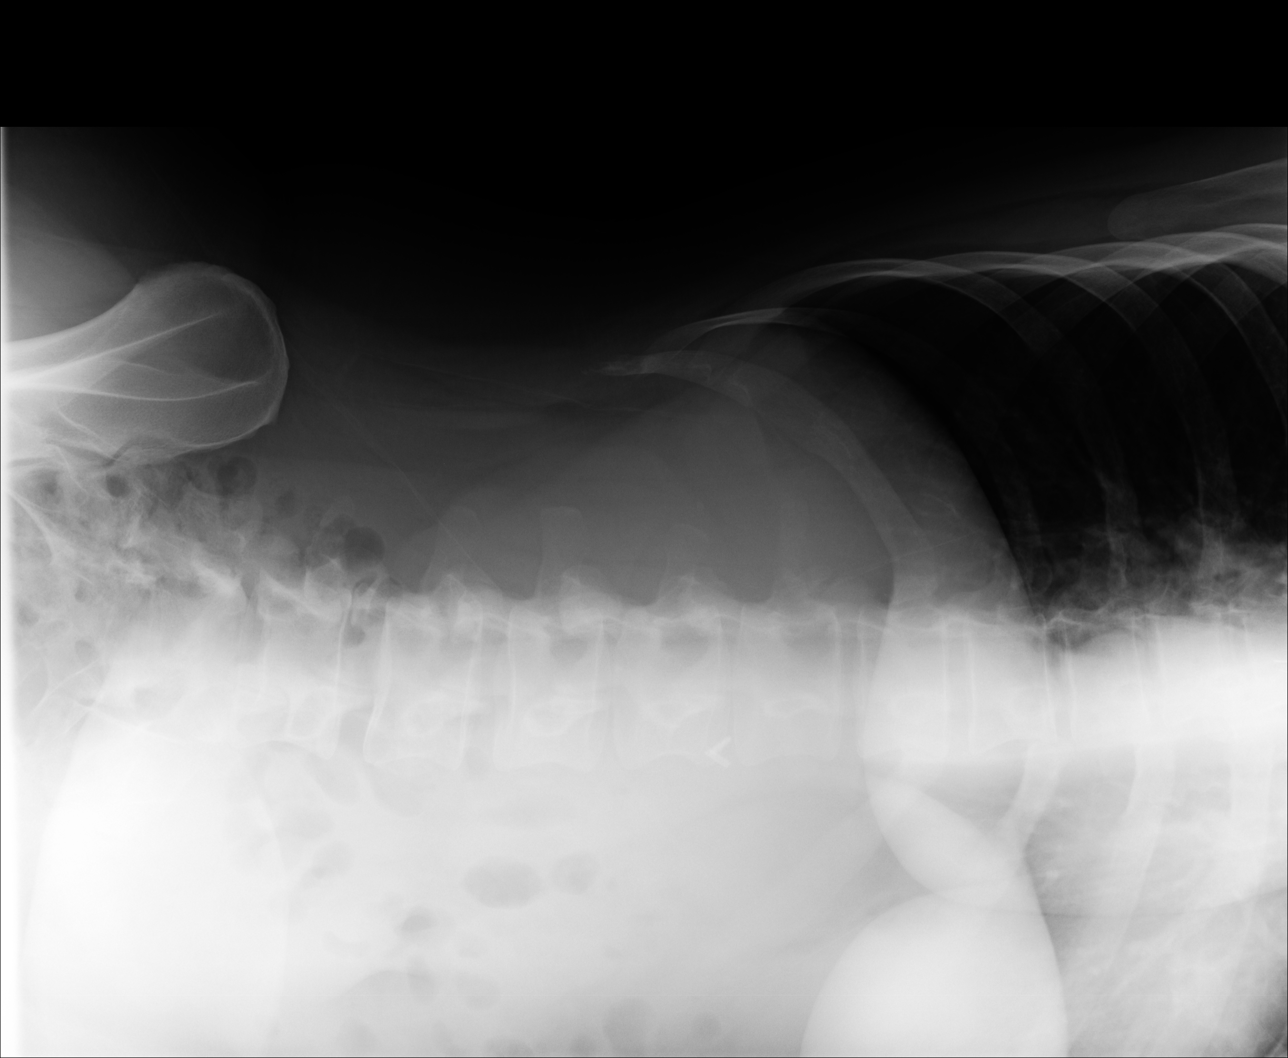

[4 of 4 positions shown; findings below may reference images not displayed]

FINDINGS: The lungs are clear. No free intraperitoneal air. Surgical clips are seen
from prior cholecystectomy. There is a 6 mm calculus overlying the right
kidney. Air is seen within nondilated small and large bowel.
IMPRESSION: 1. Nonobstructed bowel gas pattern.
2. Right-sided nephrolithiasis.

## 2012-01-30 ENCOUNTER — Emergency Department: Payer: Self-pay | Admitting: Emergency Medicine

## 2012-01-30 LAB — COMPREHENSIVE METABOLIC PANEL
Albumin: 4.3 g/dL (ref 3.4–5.0)
Alkaline Phosphatase: 73 U/L (ref 50–136)
Anion Gap: 10 (ref 7–16)
BUN: 10 mg/dL (ref 7–18)
Bilirubin,Total: 0.5 mg/dL (ref 0.2–1.0)
Calcium, Total: 8.8 mg/dL (ref 8.5–10.1)
Chloride: 109 mmol/L — ABNORMAL HIGH (ref 98–107)
Co2: 22 mmol/L (ref 21–32)
Creatinine: 0.62 mg/dL (ref 0.60–1.30)
EGFR (African American): >60
EGFR (Non-African Amer.): >60
Glucose: 88 mg/dL (ref 65–99)
Osmolality: 280 (ref 275–301)
Potassium: 3.3 mmol/L — ABNORMAL LOW (ref 3.5–5.1)
SGOT(AST): 16 U/L (ref 15–37)
SGPT (ALT): 20 U/L
Sodium: 141 mmol/L (ref 136–145)
Total Protein: 7.9 g/dL (ref 6.4–8.2)

## 2012-01-30 LAB — URINALYSIS, COMPLETE
Bacteria: NONE SEEN
Bilirubin,UR: NEGATIVE
Glucose,UR: NEGATIVE mg/dL (ref 0–75)
Hyaline Cast: 1
Ketone: NEGATIVE
Leukocyte Esterase: NEGATIVE
Nitrite: NEGATIVE
Ph: 5 (ref 4.5–8.0)
Protein: 30
RBC,UR: 593 /HPF (ref 0–5)
Specific Gravity: 1.025 (ref 1.003–1.030)
Squamous Epithelial: 2
WBC UR: 2 /HPF (ref 0–5)

## 2012-01-30 LAB — CBC
HCT: 40.2 % (ref 35.0–47.0)
HGB: 13.7 g/dL (ref 12.0–16.0)
MCH: 30.4 pg (ref 26.0–34.0)
MCHC: 34 g/dL (ref 32.0–36.0)
MCV: 90 fL (ref 80–100)
Platelet: 214 10*3/uL (ref 150–440)
RBC: 4.49 10*6/uL (ref 3.80–5.20)
RDW: 12.7 % (ref 11.5–14.5)
WBC: 9.3 10*3/uL (ref 3.6–11.0)

## 2012-01-30 LAB — WET PREP, GENITAL

## 2012-01-30 LAB — PREGNANCY, URINE: Pregnancy Test, Urine: NEGATIVE m[IU]/mL

## 2012-01-30 IMAGING — CT CT ABD-PELV W/O CM
1 of 2 series · 15 of 32 positions shown, 19 images · non-contrast
Comparison: None

REASON FOR EXAM: (1) renal stone; (2) renal stone
COMMENTS:   May transport without cardiac monitor

PROCEDURE:     CT  - CT ABDOMEN AND PELVIS W[DATE] [DATE]
RESULT:     Indication: Renal stone
TECHNIQUE: Multiple axial images from the lung bases to the symphysis pubis
were obtained without oral and without intravenous contrast.

[Series 2: 3mm soft tissue · axial · 0.76mm/px · z∈[-29,+400]mm · 15 of 157 slices shown, 19 images]
[im 7/157  soft-tissue]
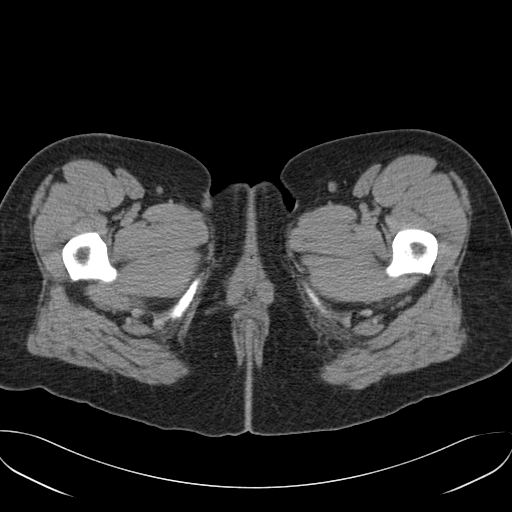
[im 7/157  bone]
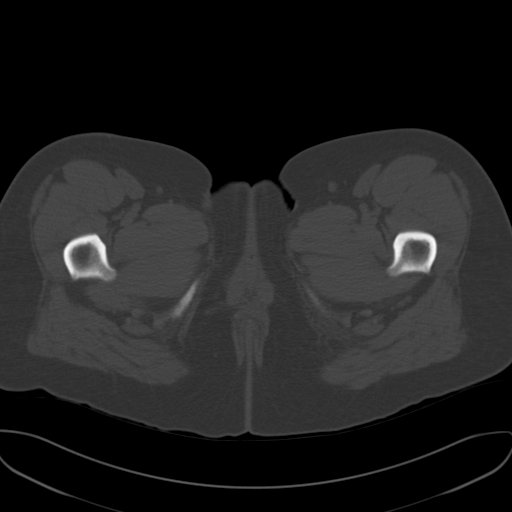
[im 21/157  soft-tissue]
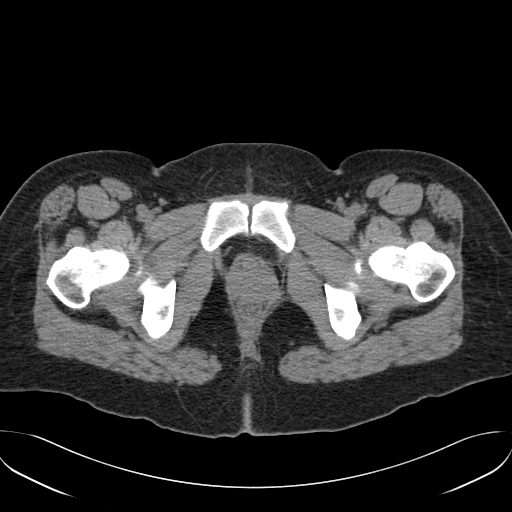
[im 34/157  soft-tissue]
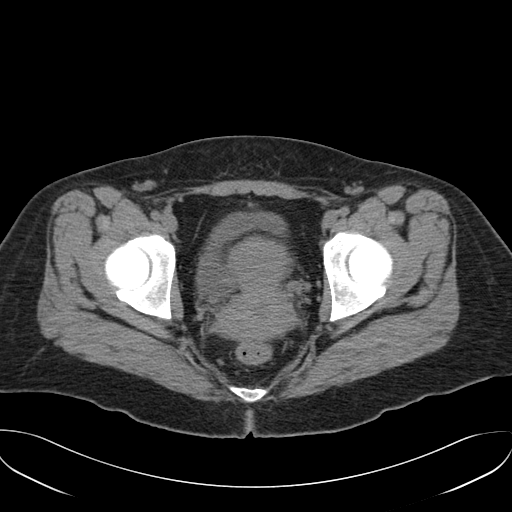
[im 41/157  soft-tissue]
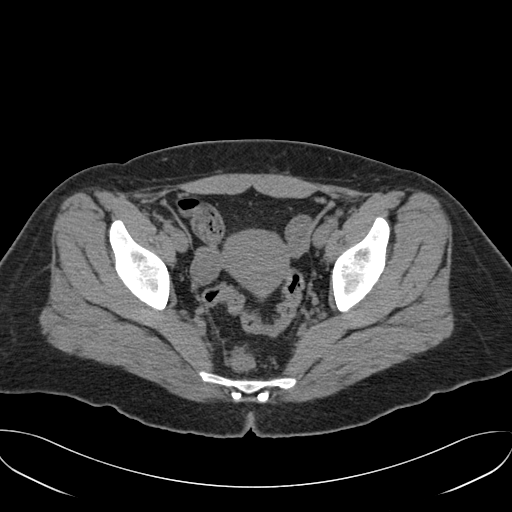
[im 55/157  soft-tissue]
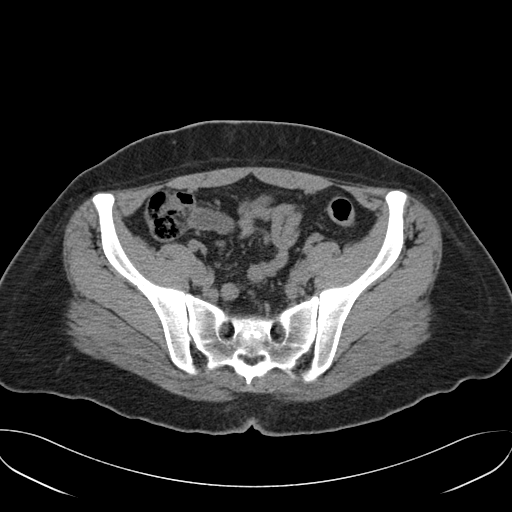
[im 68/157  soft-tissue]
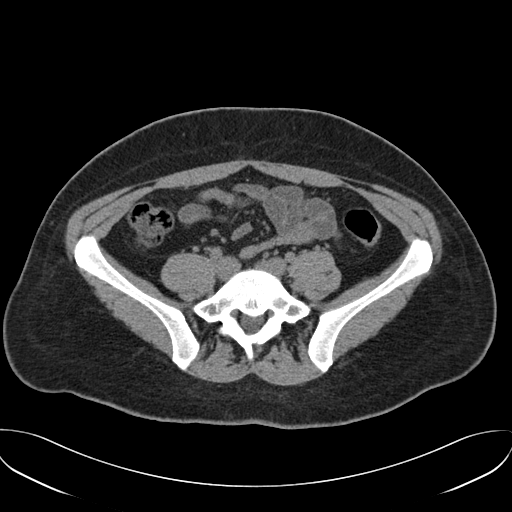
[im 82/157  soft-tissue]
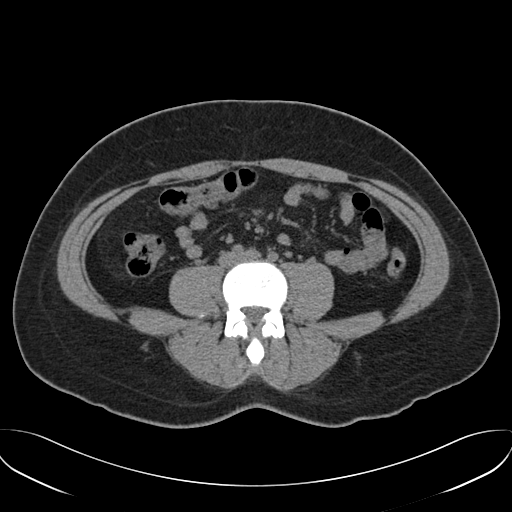
[im 89/157  soft-tissue]
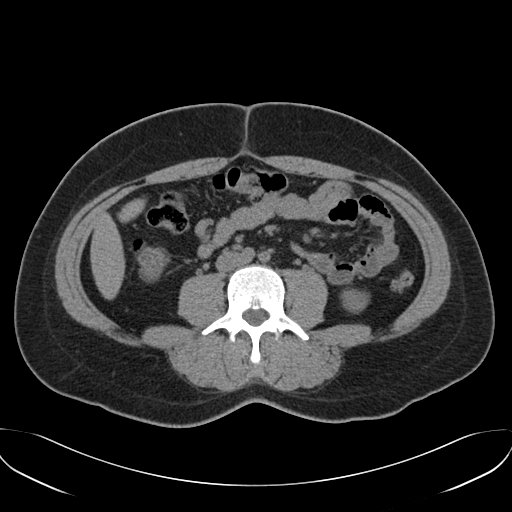
[im 102/157  soft-tissue]
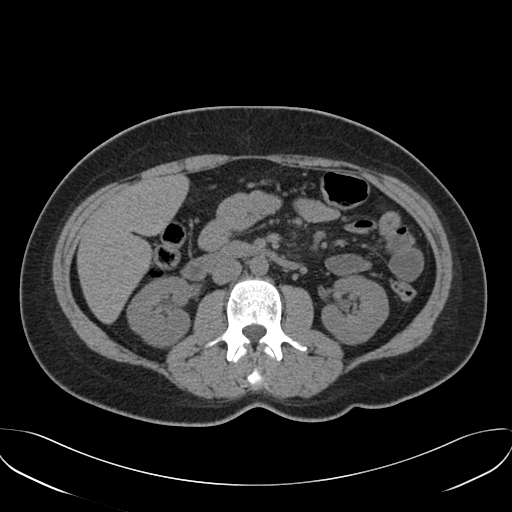
[im 102/157  bone]
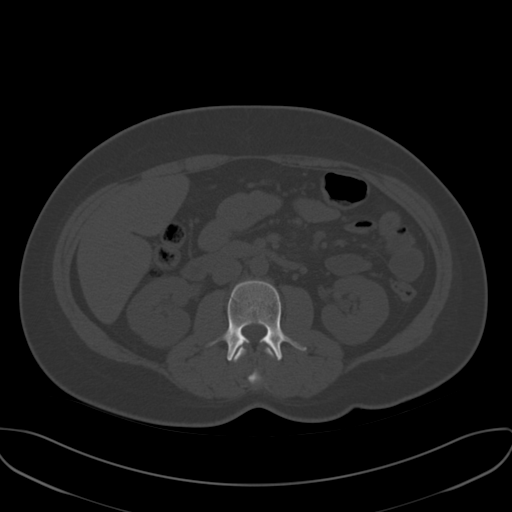
[im 116/157  soft-tissue]
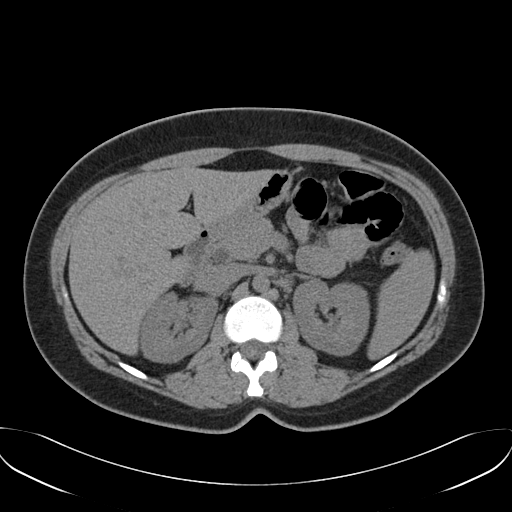
[im 123/157  soft-tissue]
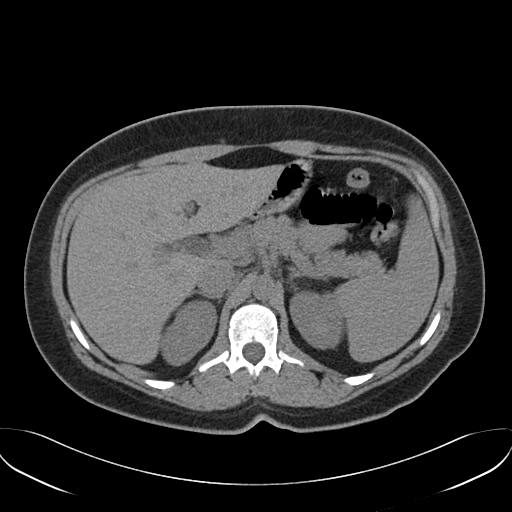
[im 129/157  lung]
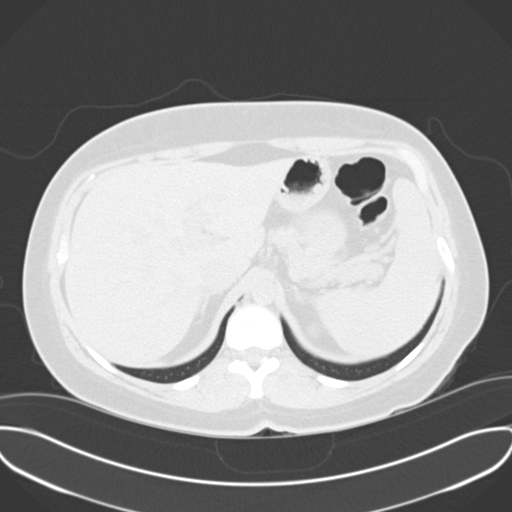
[im 136/157  soft-tissue]
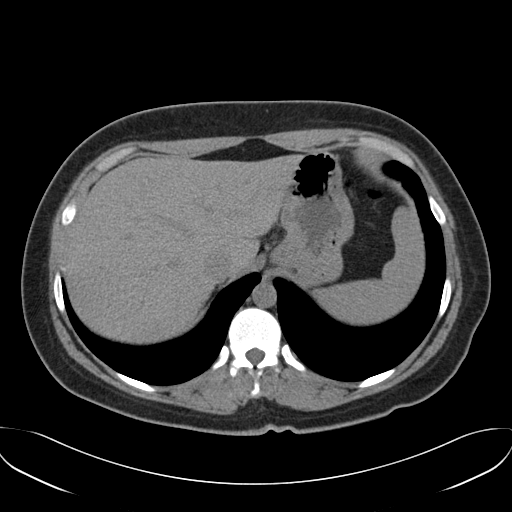
[im 136/157  lung]
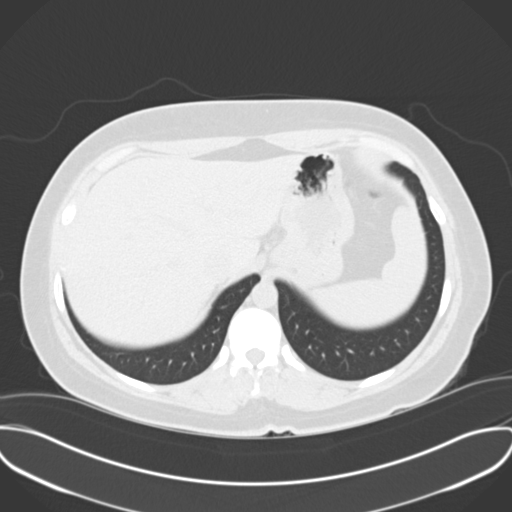
[im 143/157  lung]
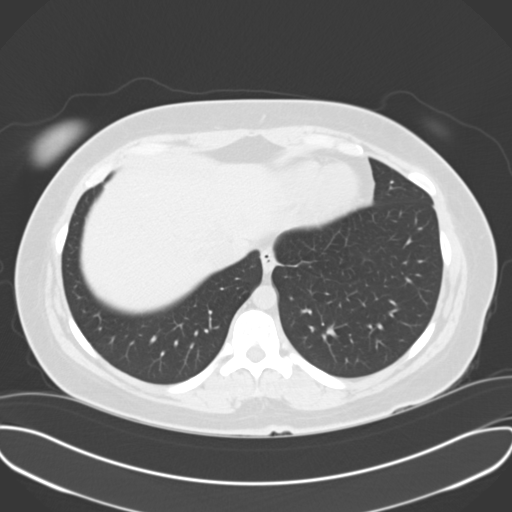
[im 150/157  soft-tissue]
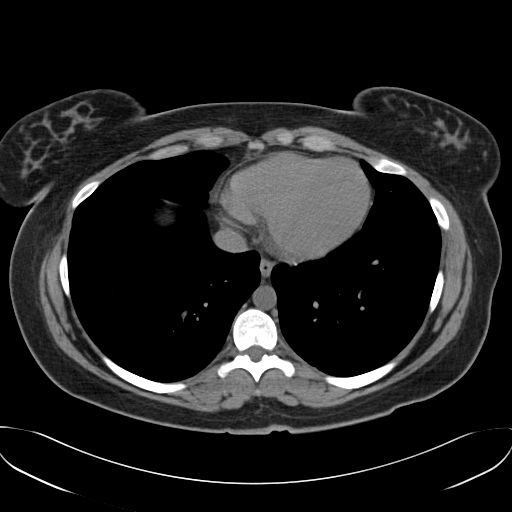
[im 150/157  lung]
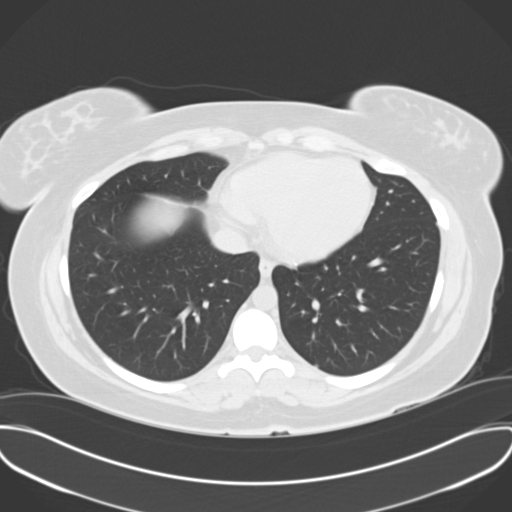

[15 of 32 positions shown; findings below may reference images not displayed]

FINDINGS: The lung bases are clear. There is no pleural or pericardial effusions.

There is a 7 mm calculus in the right renal pelvis. There is a punctate
nonobstructing right renal calculus. No obstructive uropathy. No perinephric
stranding is seen. The kidneys are symmetric in size without evidence for
exophytic mass. The bladder is unremarkable.

The liver demonstrates no focal abnormality. The gallbladder is surgically
absent. The spleen demonstrates no focal abnormality. The adrenal glands and
pancreas are normal.

The unopacified stomach, duodenum, small intestine, and large intestine are
unremarkable, but evaluation is limited by lack of oral contrast. There is a
normal caliber appendix in the right lower quadrant without periappendiceal
inflammatory changes. There is no pneumoperitoneum, pneumatosis, or portal
venous gas. There is no abdominal or pelvic free fluid. There is no
lymphadenopathy.

The abdominal aorta is normal in caliber .

The osseous structures are unremarkable.
IMPRESSION: 1. There is a 7 mm calculus in the right renal pelvis. There is a punctate
nonobstructing right renal calculus. No hydronephrosis.

[REDACTED]

## 2012-07-14 ENCOUNTER — Emergency Department: Payer: Self-pay | Admitting: Emergency Medicine

## 2012-07-14 LAB — CBC
HCT: 40.6 % (ref 35.0–47.0)
HGB: 13.5 g/dL (ref 12.0–16.0)
MCH: 29.8 pg (ref 26.0–34.0)
MCHC: 33.2 g/dL (ref 32.0–36.0)
MCV: 90 fL (ref 80–100)
Platelet: 251 10*3/uL (ref 150–440)
RBC: 4.52 10*6/uL (ref 3.80–5.20)
RDW: 13.3 % (ref 11.5–14.5)
WBC: 10.1 10*3/uL (ref 3.6–11.0)

## 2012-07-14 LAB — COMPREHENSIVE METABOLIC PANEL
Albumin: 4.6 g/dL (ref 3.4–5.0)
Alkaline Phosphatase: 99 U/L (ref 50–136)
Anion Gap: 10 (ref 7–16)
BUN: 9 mg/dL (ref 7–18)
Bilirubin,Total: 0.7 mg/dL (ref 0.2–1.0)
Calcium, Total: 8.8 mg/dL (ref 8.5–10.1)
Chloride: 105 mmol/L (ref 98–107)
Co2: 22 mmol/L (ref 21–32)
Creatinine: 0.66 mg/dL (ref 0.60–1.30)
EGFR (African American): >60
EGFR (Non-African Amer.): >60
Glucose: 85 mg/dL (ref 65–99)
Osmolality: 272 (ref 275–301)
Potassium: 3 mmol/L — ABNORMAL LOW (ref 3.5–5.1)
SGOT(AST): 13 U/L — ABNORMAL LOW (ref 15–37)
SGPT (ALT): 21 U/L
Sodium: 137 mmol/L (ref 136–145)
Total Protein: 8.7 g/dL — ABNORMAL HIGH (ref 6.4–8.2)

## 2012-07-14 LAB — URINALYSIS, COMPLETE
Bilirubin,UR: NEGATIVE
Blood: NEGATIVE
Glucose,UR: NEGATIVE mg/dL (ref 0–75)
Nitrite: NEGATIVE
Ph: 6 (ref 4.5–8.0)
Protein: NEGATIVE
RBC,UR: 2 /HPF (ref 0–5)
Specific Gravity: 1.012 (ref 1.003–1.030)
Squamous Epithelial: 17
WBC UR: 13 /HPF (ref 0–5)

## 2012-07-14 LAB — WET PREP, GENITAL

## 2012-07-14 LAB — LIPASE, BLOOD: Lipase: 137 U/L (ref 73–393)

## 2012-07-14 LAB — HCG, QUANTITATIVE, PREGNANCY: Beta Hcg, Quant.: 440 m[IU]/mL — ABNORMAL HIGH

## 2012-07-14 IMAGING — US US OB < 14 WEEKS - US OB TV
1 series · 14 of 28 positions shown · non-contrast
Comparison: none

REASON FOR EXAM: abdominal cramping
COMMENTS:

[Series 1: us ob < 14 weeks - us ob tv · 0.26mm/px · 14 of 120 slices shown]
[im 5/120]
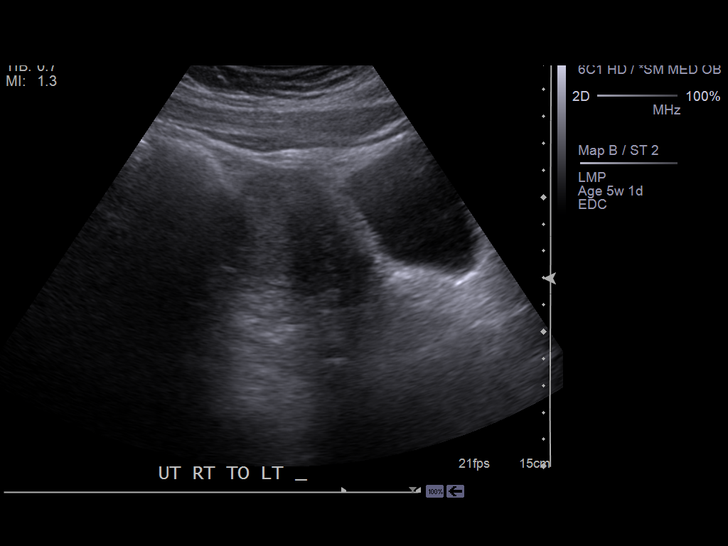
[im 14/120]
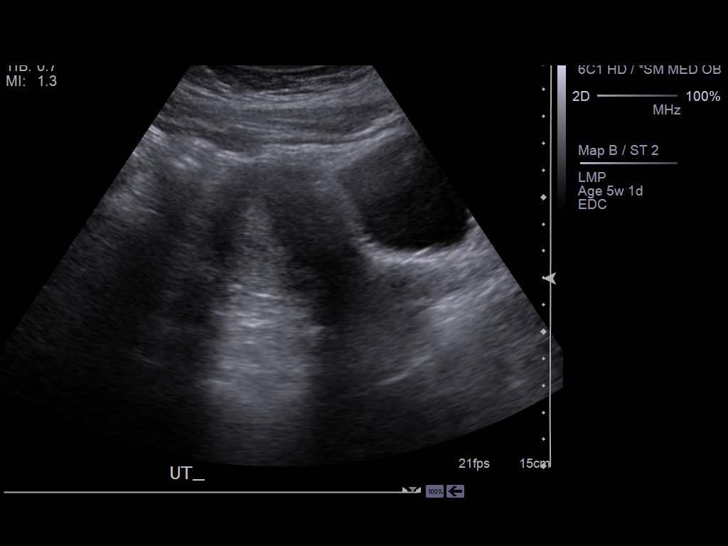
[im 23/120]
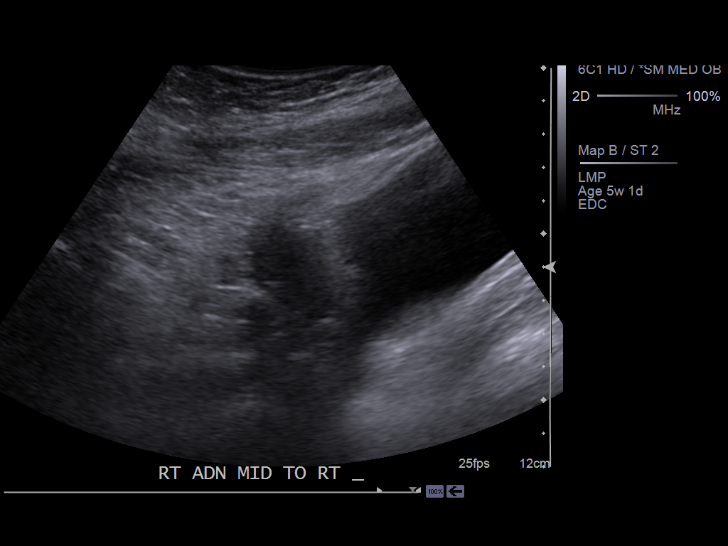
[im 31/120]
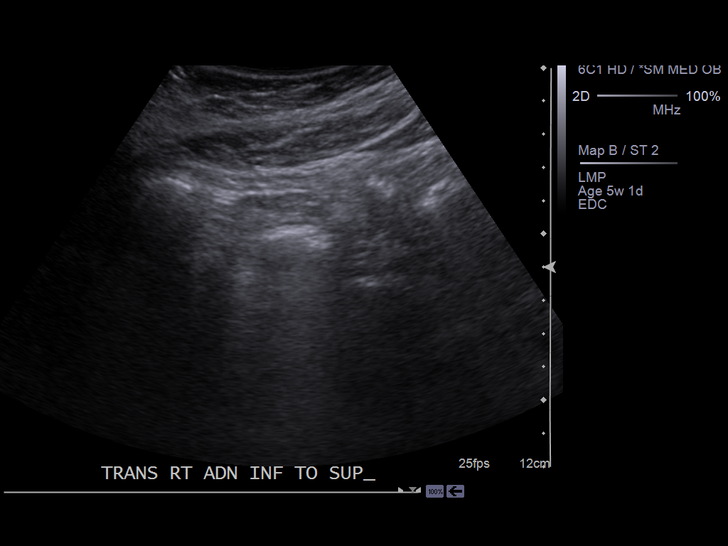
[im 40/120]
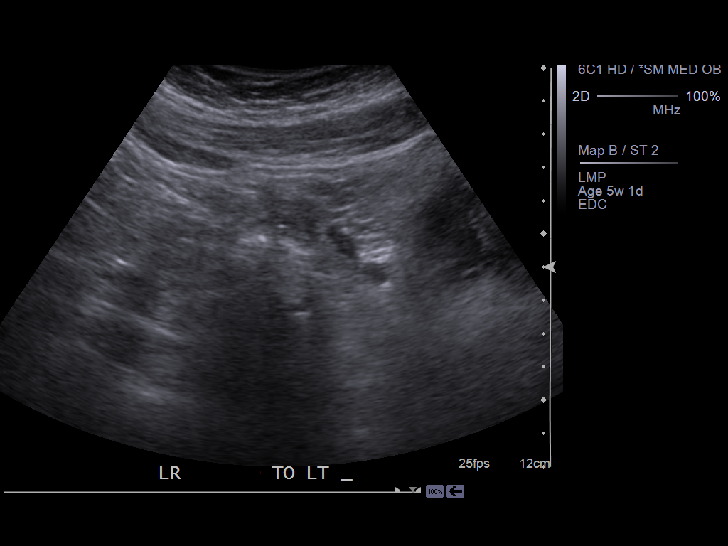
[im 49/120]
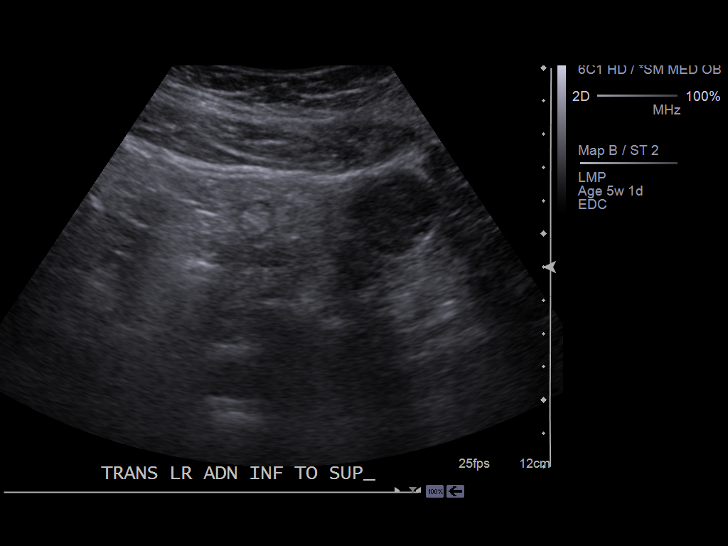
[im 58/120]
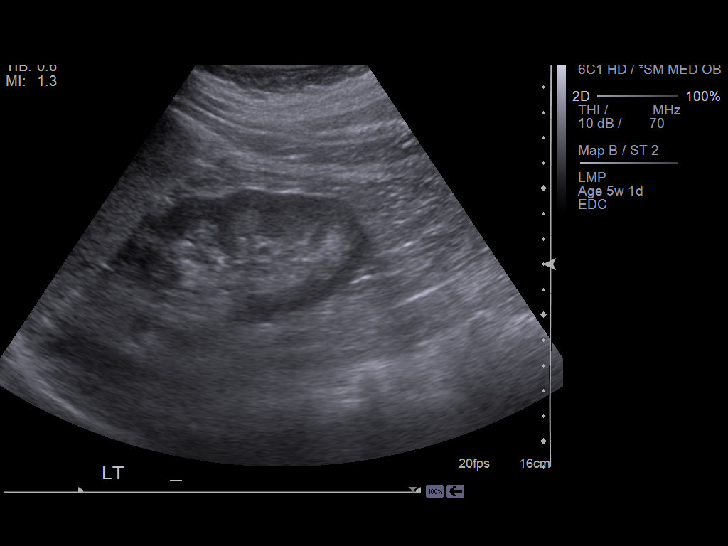
[im 67/120]
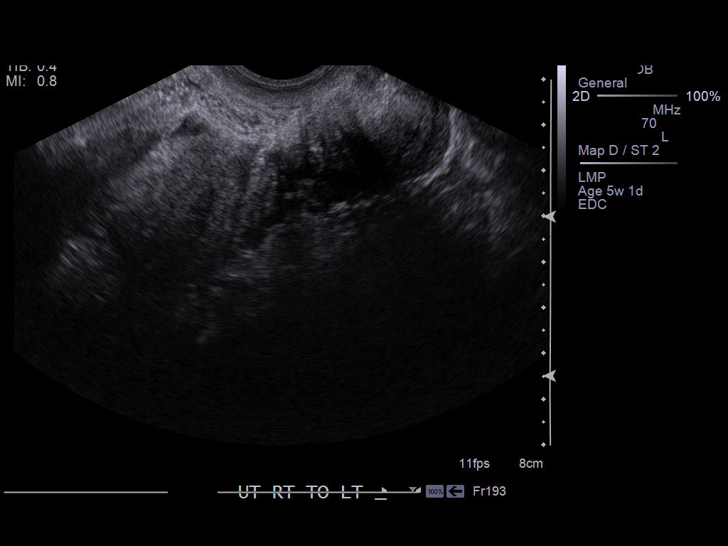
[im 75/120]
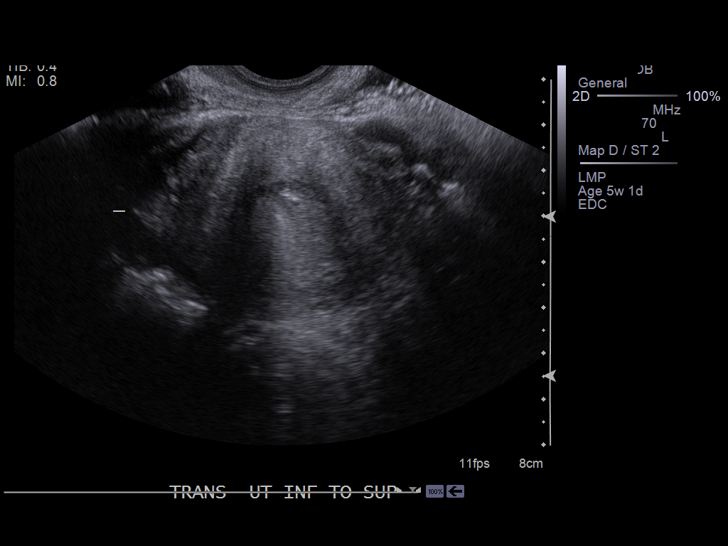
[im 84/120]
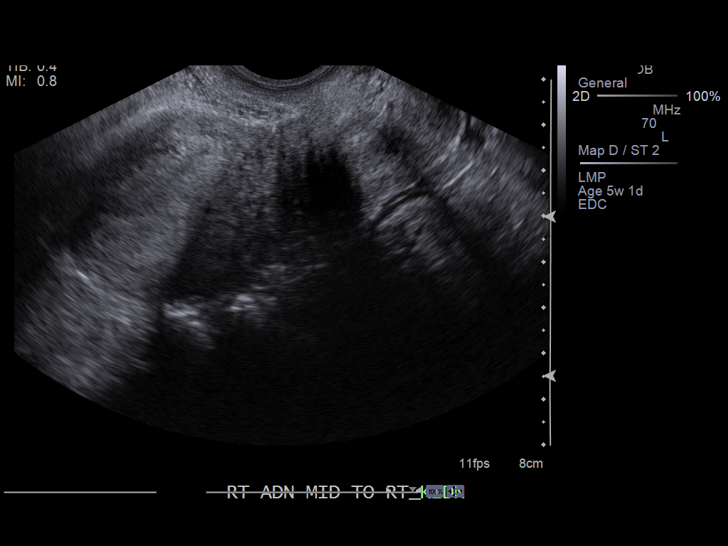
[im 93/120]
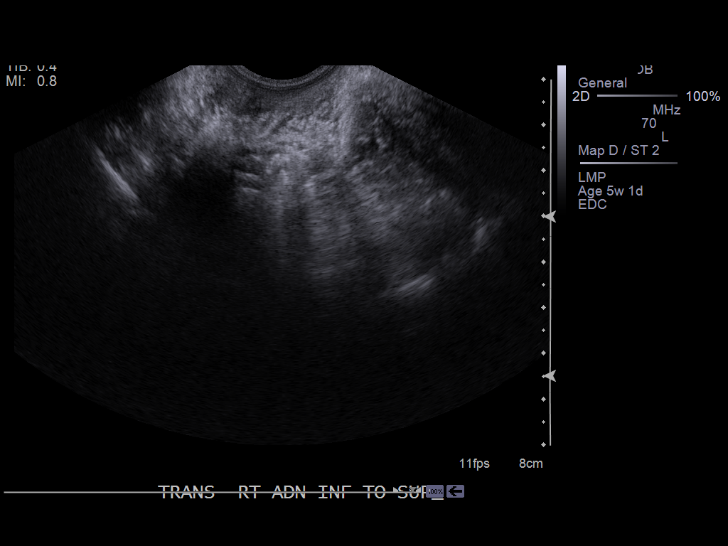
[im 102/120]
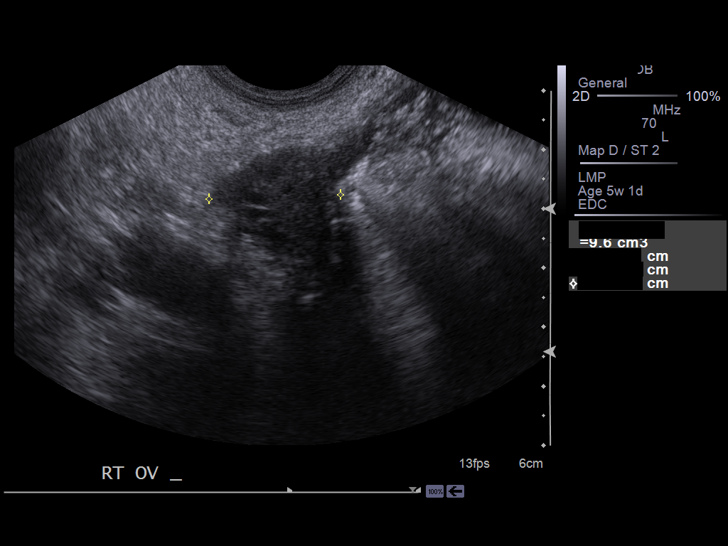
[im 111/120]
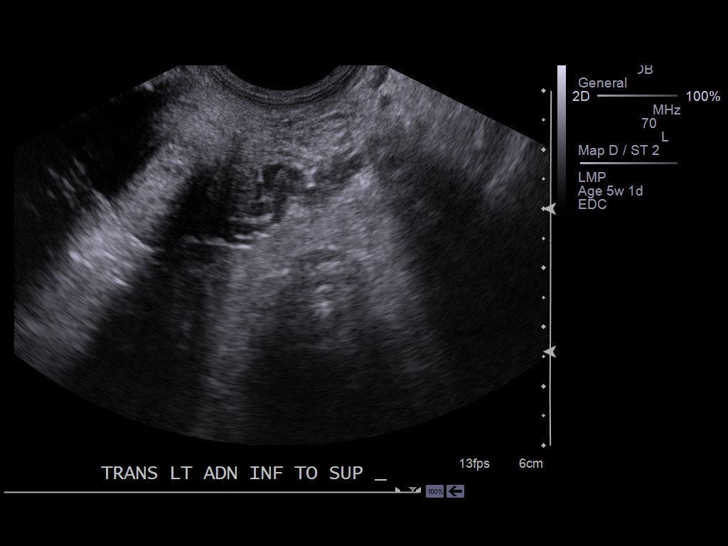
[im 120/120]
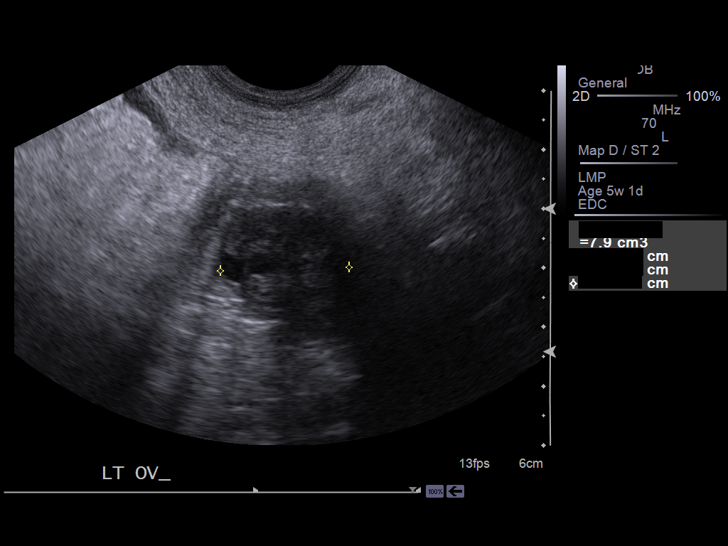

[14 of 28 positions shown; findings below may reference images not displayed]

PROCEDURE:     US  - US OB LESS THAN 14 WEEKS/W TRANS  - [DATE] [DATE]

RESULT:     The uterus is normal in contour. No gestational sac is
demonstrated. The endometrium is thickened to approximately 12 mm in the
uterine fundus. The maternal ovaries are normal in echotexture and contour
and vascularity. The right ovary measures 4 x 2 x 2.2 cm. The left ovary
measures 3 by 2.3 x 2.2 cm.
IMPRESSION: 1. An IUP is not identified at this point though there is known to be an
elevated beta-hCG. No definite ectopic pregnancy is demonstrated either.
Serial beta-hCG determinations and followup ultrasound examinations are
recommended.

A preliminary report was sent to the [HOSPITAL] the conclusion
of the study.

## 2012-08-03 ENCOUNTER — Emergency Department: Payer: Self-pay | Admitting: Emergency Medicine

## 2012-08-03 LAB — URINALYSIS, COMPLETE
Bilirubin,UR: NEGATIVE
Blood: NEGATIVE
Glucose,UR: NEGATIVE mg/dL (ref 0–75)
Nitrite: NEGATIVE
Ph: 6 (ref 4.5–8.0)
Protein: 30
RBC,UR: 4 /HPF (ref 0–5)
Specific Gravity: 1.028 (ref 1.003–1.030)
Squamous Epithelial: 30
WBC UR: 24 /HPF (ref 0–5)

## 2012-08-03 LAB — COMPREHENSIVE METABOLIC PANEL
Albumin: 4.4 g/dL (ref 3.4–5.0)
Alkaline Phosphatase: 91 U/L (ref 50–136)
Anion Gap: 11 (ref 7–16)
BUN: 10 mg/dL (ref 7–18)
Bilirubin,Total: 0.7 mg/dL (ref 0.2–1.0)
Calcium, Total: 9 mg/dL (ref 8.5–10.1)
Chloride: 103 mmol/L (ref 98–107)
Co2: 22 mmol/L (ref 21–32)
Creatinine: 0.45 mg/dL — ABNORMAL LOW (ref 0.60–1.30)
EGFR (African American): >60
EGFR (Non-African Amer.): >60
Glucose: 88 mg/dL (ref 65–99)
Osmolality: 270 (ref 275–301)
Potassium: 3.9 mmol/L (ref 3.5–5.1)
SGOT(AST): 52 U/L — ABNORMAL HIGH (ref 15–37)
SGPT (ALT): 52 U/L (ref 12–78)
Sodium: 136 mmol/L (ref 136–145)
Total Protein: 8.7 g/dL — ABNORMAL HIGH (ref 6.4–8.2)

## 2012-08-03 LAB — CBC
HCT: 40.5 % (ref 35.0–47.0)
HGB: 13.8 g/dL (ref 12.0–16.0)
MCH: 29.7 pg (ref 26.0–34.0)
MCHC: 34 g/dL (ref 32.0–36.0)
MCV: 87 fL (ref 80–100)
Platelet: 264 10*3/uL (ref 150–440)
RBC: 4.64 10*6/uL (ref 3.80–5.20)
RDW: 13.2 % (ref 11.5–14.5)
WBC: 14 10*3/uL — ABNORMAL HIGH (ref 3.6–11.0)

## 2012-08-03 LAB — PREGNANCY, URINE: Pregnancy Test, Urine: POSITIVE m[IU]/mL

## 2012-08-03 LAB — HCG, QUANTITATIVE, PREGNANCY: Beta Hcg, Quant.: 57403 m[IU]/mL — ABNORMAL HIGH

## 2012-08-05 LAB — URINE CULTURE

## 2012-08-09 ENCOUNTER — Inpatient Hospital Stay: Payer: Self-pay | Admitting: Obstetrics and Gynecology

## 2012-08-09 LAB — CBC
HCT: 36.6 % (ref 35.0–47.0)
HGB: 12.5 g/dL (ref 12.0–16.0)
MCH: 29.7 pg (ref 26.0–34.0)
MCHC: 34.2 g/dL (ref 32.0–36.0)
MCV: 87 fL (ref 80–100)
Platelet: 224 10*3/uL (ref 150–440)
RBC: 4.23 10*6/uL (ref 3.80–5.20)
RDW: 12.9 % (ref 11.5–14.5)
WBC: 14.3 10*3/uL — ABNORMAL HIGH (ref 3.6–11.0)

## 2012-08-09 LAB — URINALYSIS, COMPLETE
Bacteria: NONE SEEN
Bilirubin,UR: NEGATIVE
Blood: NEGATIVE
Glucose,UR: NEGATIVE mg/dL (ref 0–75)
Ketone: NEGATIVE
Nitrite: NEGATIVE
Ph: 7 (ref 4.5–8.0)
Protein: NEGATIVE
RBC,UR: NONE SEEN /HPF (ref 0–5)
Specific Gravity: 1.016 (ref 1.003–1.030)
Squamous Epithelial: 21
WBC UR: 24 /HPF (ref 0–5)

## 2012-08-09 LAB — COMPREHENSIVE METABOLIC PANEL
Albumin: 4.1 g/dL (ref 3.4–5.0)
Alkaline Phosphatase: 87 U/L (ref 50–136)
Anion Gap: 9 (ref 7–16)
BUN: 8 mg/dL (ref 7–18)
Bilirubin,Total: 0.4 mg/dL (ref 0.2–1.0)
Calcium, Total: 9 mg/dL (ref 8.5–10.1)
Chloride: 103 mmol/L (ref 98–107)
Co2: 24 mmol/L (ref 21–32)
Creatinine: 0.71 mg/dL (ref 0.60–1.30)
EGFR (African American): >60
EGFR (Non-African Amer.): >60
Glucose: 99 mg/dL (ref 65–99)
Osmolality: 270 (ref 275–301)
Potassium: 3.2 mmol/L — ABNORMAL LOW (ref 3.5–5.1)
SGOT(AST): 29 U/L (ref 15–37)
SGPT (ALT): 46 U/L (ref 12–78)
Sodium: 136 mmol/L (ref 136–145)
Total Protein: 8 g/dL (ref 6.4–8.2)

## 2012-08-09 LAB — HCG, QUANTITATIVE, PREGNANCY: Beta Hcg, Quant.: 70210 m[IU]/mL — ABNORMAL HIGH

## 2012-08-09 LAB — LIPASE, BLOOD: Lipase: 127 U/L (ref 73–393)

## 2012-08-09 LAB — MAGNESIUM: Magnesium: 1.6 mg/dL — ABNORMAL LOW

## 2012-08-09 IMAGING — US US OB < 14 WEEKS
1 series · 14 of 28 positions shown · non-contrast
Comparison: none

REASON FOR EXAM: hyperemesis abd pain pregnant
COMMENTS:   May transport without cardiac monitor

PROCEDURE:     US  - US OB LESS THAN 14 WEEKS  - [DATE]  [DATE]
RESULT:     Comparison: None
TECHNIQUE: Multiple transabdominal gray-scale images of the pelvis
performed.

[Series 1: us ob < 14 weeks · 0.28mm/px · 14 of 66 slices shown]
[im 3/66]
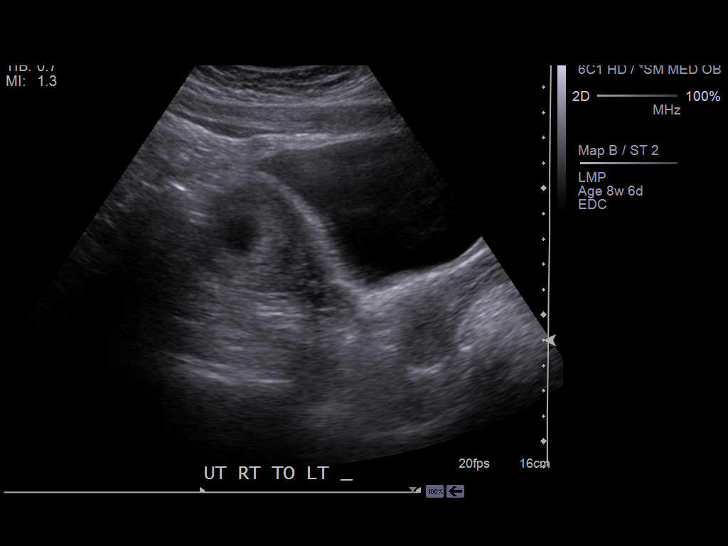
[im 8/66]
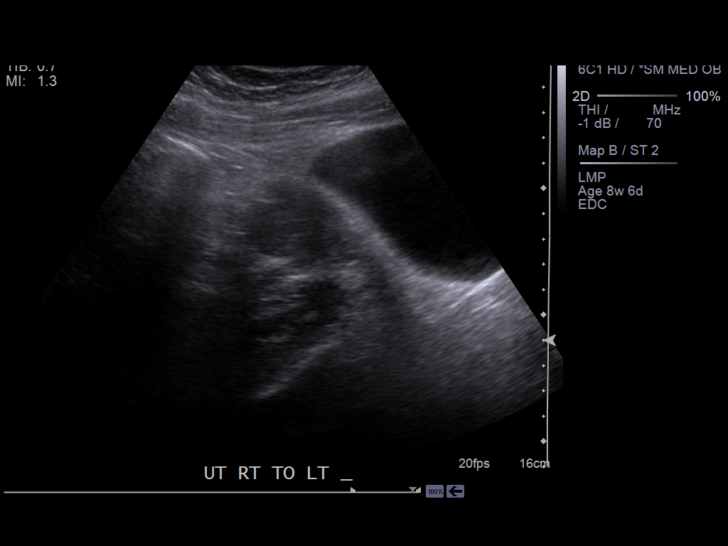
[im 13/66]
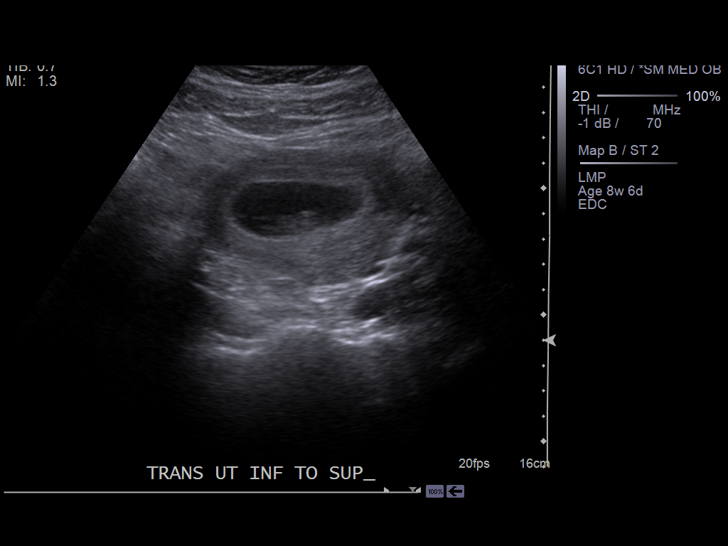
[im 17/66]
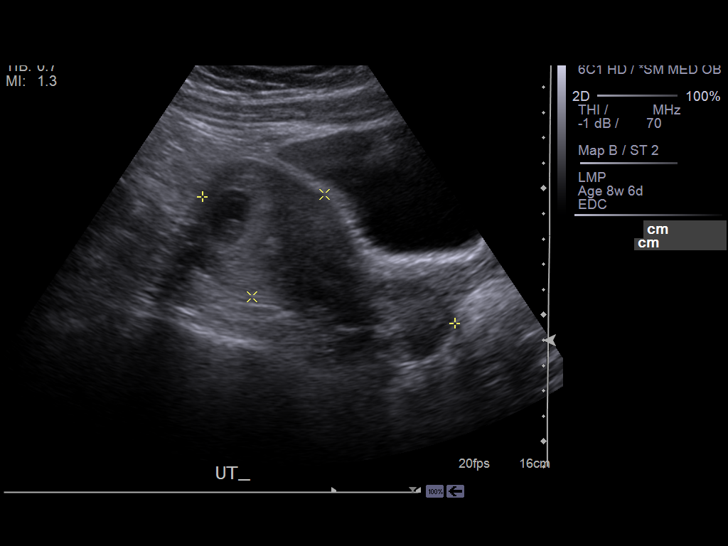
[im 22/66]
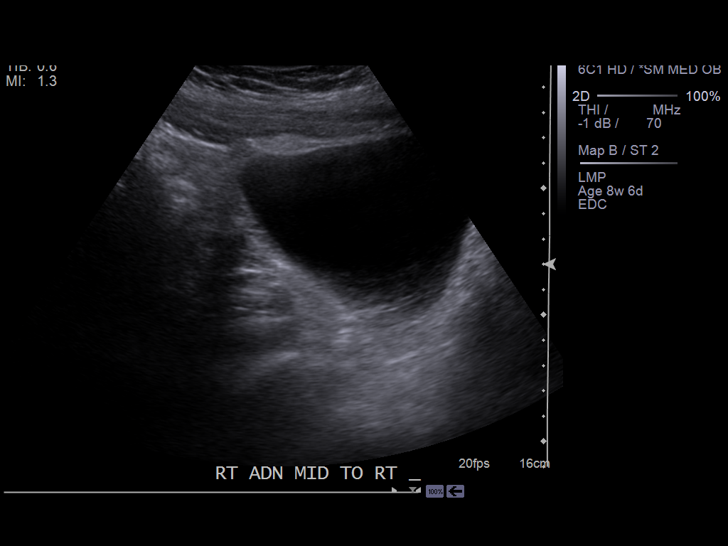
[im 27/66]
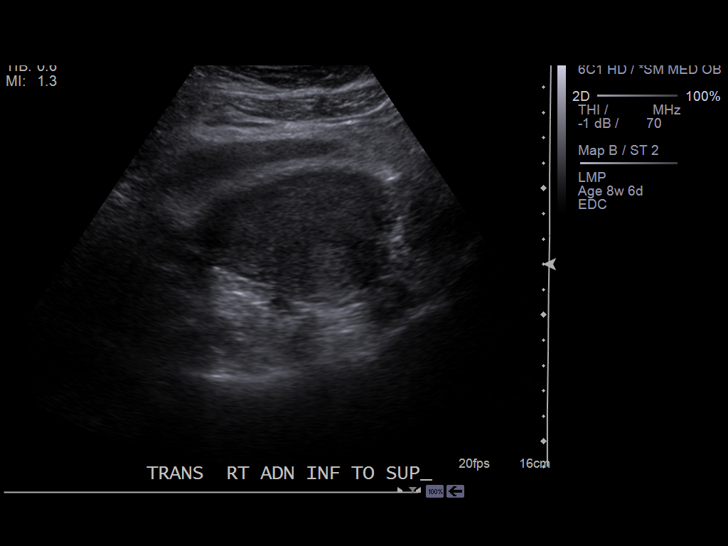
[im 32/66]
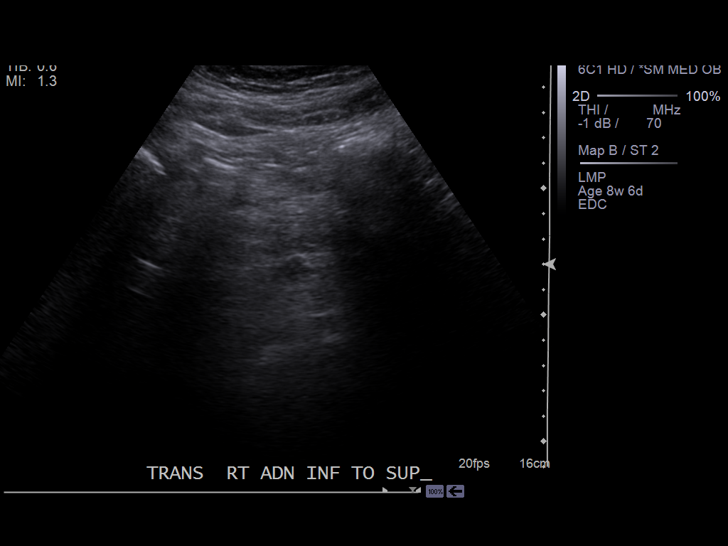
[im 37/66]
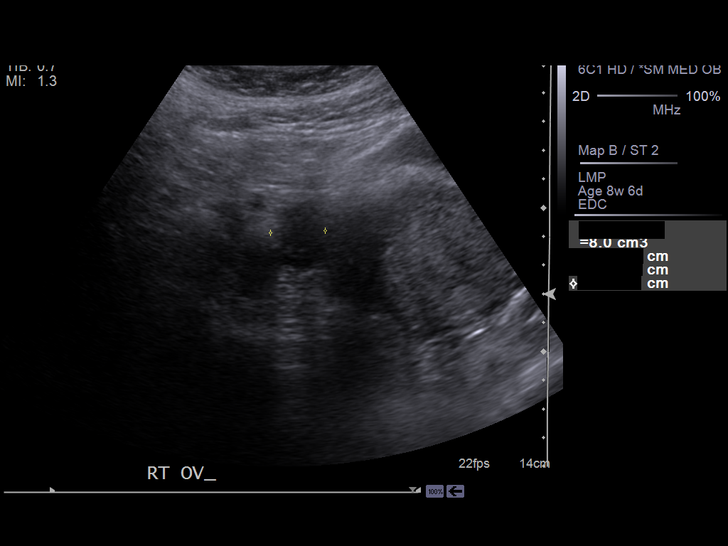
[im 41/66]
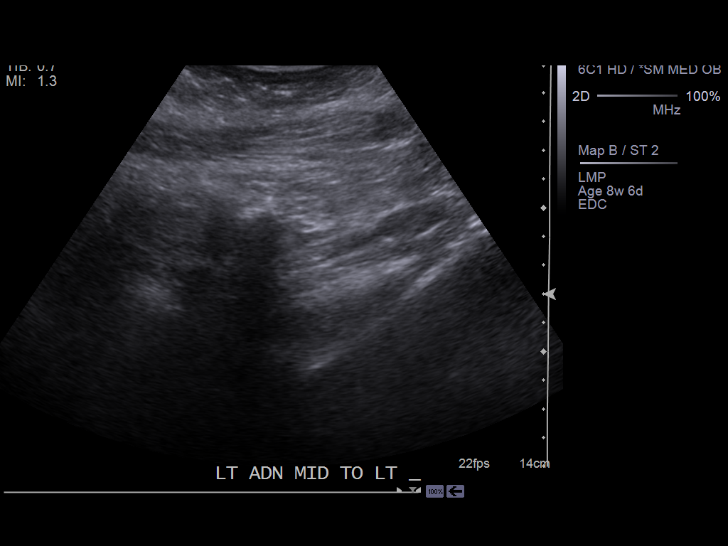
[im 46/66]
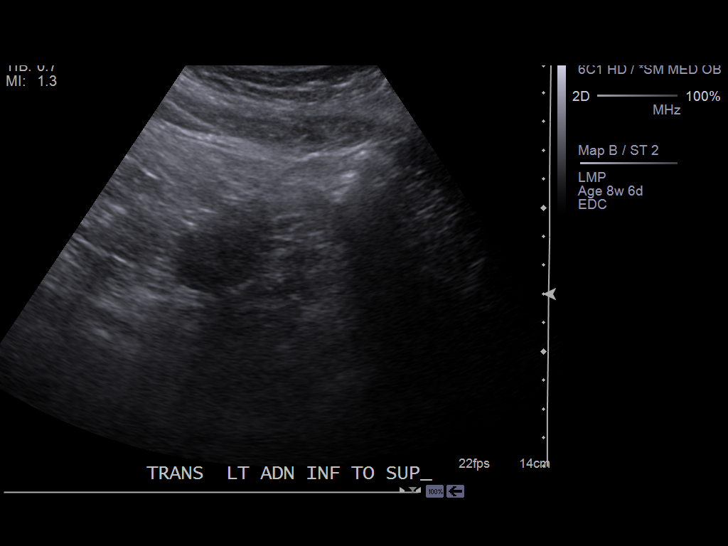
[im 51/66]
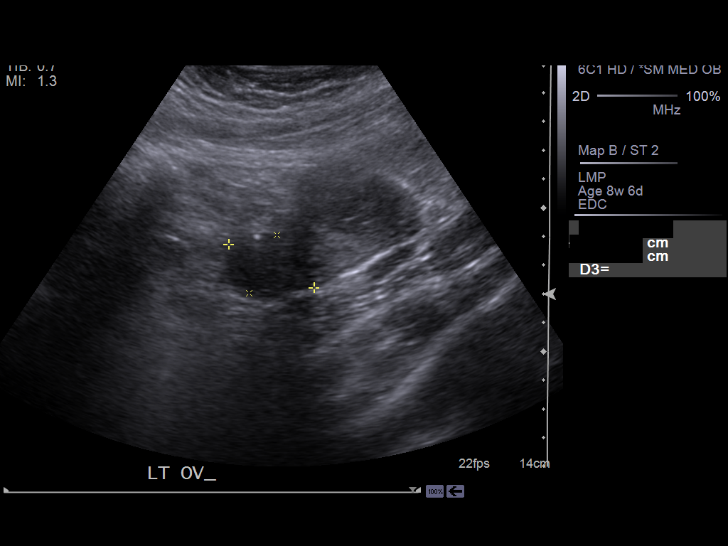
[im 56/66]
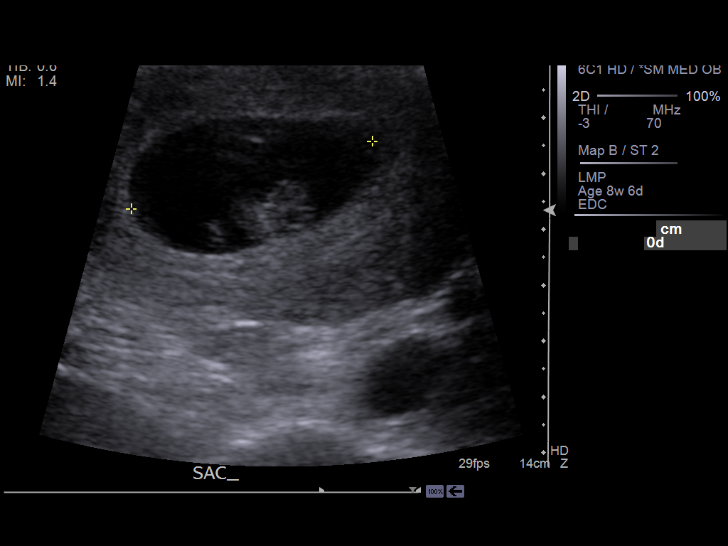
[im 61/66]
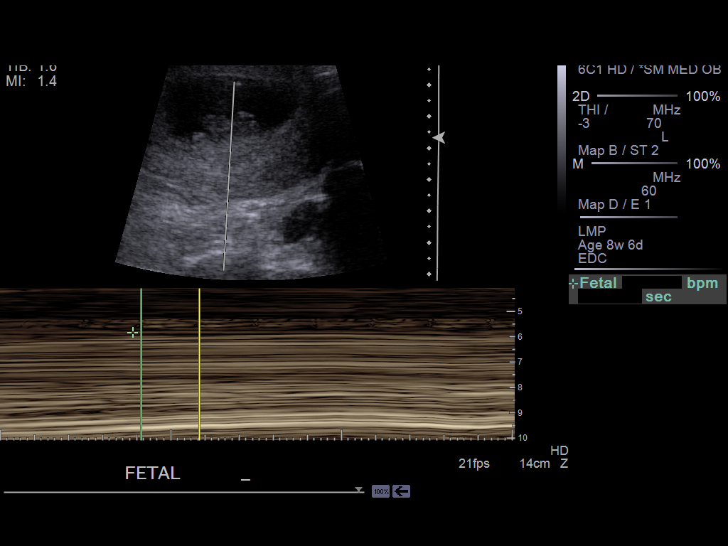
[im 66/66]
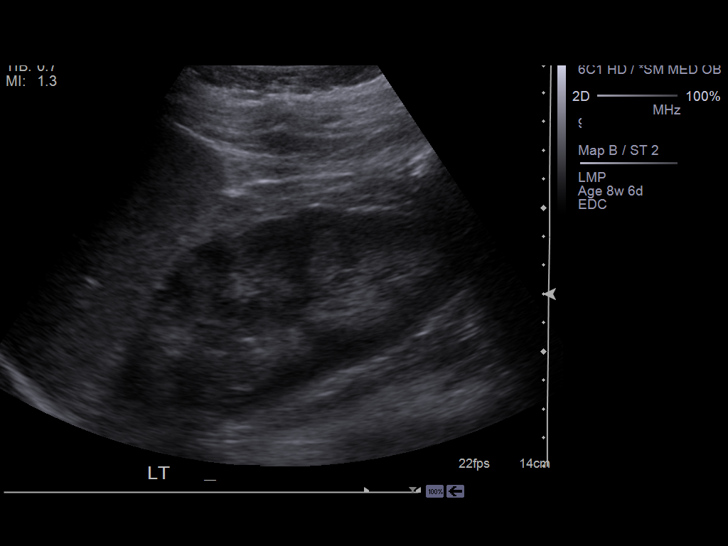

[14 of 28 positions shown; findings below may reference images not displayed]

FINDINGS: There is a single live intrauterine pregnancy visualized with a crown-rump
length of 1.73 cm dating the pregnancy at 8 weeks 2 days. There is a normal
yolk sac. There is a normal fetal heart rate of 175 beats per minute.

The right ovary measures 3.4 x 2.4 x 1.9 cm.  The left ovary measures 3.3 x
2.3 x 2.8 cm.  There is no adnexal mass.

There is no pelvic free fluid.
IMPRESSION: Single live intrauterine pregnancy dating 8 weeks 2 days with an estimated
due date of [DATE].

[REDACTED]

## 2012-08-10 LAB — CBC WITH DIFFERENTIAL/PLATELET
Basophil #: 0 10*3/uL (ref 0.0–0.1)
Basophil %: 0.4 %
Eosinophil #: 0.2 10*3/uL (ref 0.0–0.7)
Eosinophil %: 2.5 %
HCT: 32 % — ABNORMAL LOW (ref 35.0–47.0)
HGB: 10.7 g/dL — ABNORMAL LOW (ref 12.0–16.0)
Lymphocyte #: 1.7 10*3/uL (ref 1.0–3.6)
Lymphocyte %: 23.8 %
MCH: 29.4 pg (ref 26.0–34.0)
MCHC: 33.5 g/dL (ref 32.0–36.0)
MCV: 88 fL (ref 80–100)
Monocyte #: 0.7 x10 3/mm (ref 0.2–0.9)
Monocyte %: 9.7 %
Neutrophil #: 4.4 10*3/uL (ref 1.4–6.5)
Neutrophil %: 63.6 %
Platelet: 209 10*3/uL (ref 150–440)
RBC: 3.65 10*6/uL — ABNORMAL LOW (ref 3.80–5.20)
RDW: 12.8 % (ref 11.5–14.5)
WBC: 7 10*3/uL (ref 3.6–11.0)

## 2012-08-10 LAB — COMPREHENSIVE METABOLIC PANEL
Albumin: 3.2 g/dL — ABNORMAL LOW (ref 3.4–5.0)
Alkaline Phosphatase: 67 U/L (ref 50–136)
Anion Gap: 8 (ref 7–16)
BUN: 6 mg/dL — ABNORMAL LOW (ref 7–18)
Bilirubin,Total: 0.2 mg/dL (ref 0.2–1.0)
Calcium, Total: 8.2 mg/dL — ABNORMAL LOW (ref 8.5–10.1)
Chloride: 109 mmol/L — ABNORMAL HIGH (ref 98–107)
Co2: 24 mmol/L (ref 21–32)
Creatinine: 0.51 mg/dL — ABNORMAL LOW (ref 0.60–1.30)
EGFR (African American): >60
EGFR (Non-African Amer.): >60
Glucose: 94 mg/dL (ref 65–99)
Osmolality: 279 (ref 275–301)
Potassium: 3.3 mmol/L — ABNORMAL LOW (ref 3.5–5.1)
SGOT(AST): 21 U/L (ref 15–37)
SGPT (ALT): 31 U/L (ref 12–78)
Sodium: 141 mmol/L (ref 136–145)
Total Protein: 6.6 g/dL (ref 6.4–8.2)

## 2012-08-10 LAB — URINE CULTURE

## 2012-08-11 LAB — BETA STREP CULTURE(ARMC)

## 2012-08-13 ENCOUNTER — Inpatient Hospital Stay: Payer: Self-pay | Admitting: Obstetrics and Gynecology

## 2012-08-13 LAB — CBC WITH DIFFERENTIAL/PLATELET
Basophil #: 0 10*3/uL (ref 0.0–0.1)
Basophil %: 0.2 %
Eosinophil #: 0 10*3/uL (ref 0.0–0.7)
Eosinophil %: 0.2 %
HCT: 37.8 % (ref 35.0–47.0)
HGB: 12.9 g/dL (ref 12.0–16.0)
Lymphocyte #: 2 10*3/uL (ref 1.0–3.6)
Lymphocyte %: 14.2 %
MCH: 29.6 pg (ref 26.0–34.0)
MCHC: 34.2 g/dL (ref 32.0–36.0)
MCV: 87 fL (ref 80–100)
Monocyte #: 0.9 x10 3/mm (ref 0.2–0.9)
Monocyte %: 6.3 %
Neutrophil #: 10.9 10*3/uL — ABNORMAL HIGH (ref 1.4–6.5)
Neutrophil %: 79.1 %
Platelet: 269 10*3/uL (ref 150–440)
RBC: 4.37 10*6/uL (ref 3.80–5.20)
RDW: 13 % (ref 11.5–14.5)
WBC: 13.8 10*3/uL — ABNORMAL HIGH (ref 3.6–11.0)

## 2012-08-13 LAB — URINALYSIS, COMPLETE
Bacteria: NONE SEEN
Bilirubin,UR: NEGATIVE
Blood: NEGATIVE
Glucose,UR: 150 mg/dL (ref 0–75)
Ketone: NEGATIVE
Leukocyte Esterase: NEGATIVE
Nitrite: NEGATIVE
Ph: 7 (ref 4.5–8.0)
Protein: NEGATIVE
RBC,UR: <1 /HPF (ref 0–5)
Specific Gravity: 1.005 (ref 1.003–1.030)
Squamous Epithelial: 2
WBC UR: <1 /HPF (ref 0–5)

## 2012-08-13 LAB — COMPREHENSIVE METABOLIC PANEL
Albumin: 3.8 g/dL (ref 3.4–5.0)
Alkaline Phosphatase: 81 U/L (ref 50–136)
Anion Gap: 9 (ref 7–16)
BUN: 8 mg/dL (ref 7–18)
Bilirubin,Total: 0.2 mg/dL (ref 0.2–1.0)
Calcium, Total: 8.9 mg/dL (ref 8.5–10.1)
Chloride: 104 mmol/L (ref 98–107)
Co2: 24 mmol/L (ref 21–32)
Creatinine: 0.49 mg/dL — ABNORMAL LOW (ref 0.60–1.30)
EGFR (African American): >60
EGFR (Non-African Amer.): >60
Glucose: 86 mg/dL (ref 65–99)
Osmolality: 271 (ref 275–301)
Potassium: 3.2 mmol/L — ABNORMAL LOW (ref 3.5–5.1)
SGOT(AST): 22 U/L (ref 15–37)
SGPT (ALT): 28 U/L (ref 12–78)
Sodium: 137 mmol/L (ref 136–145)
Total Protein: 7.9 g/dL (ref 6.4–8.2)

## 2012-08-13 LAB — T4, FREE: Free Thyroxine: 1 ng/dL (ref 0.76–1.46)

## 2012-08-13 LAB — TSH: Thyroid Stimulating Horm: 2.16 u[IU]/mL

## 2012-08-15 LAB — URINE CULTURE

## 2012-09-07 ENCOUNTER — Encounter: Payer: Self-pay | Admitting: Maternal and Fetal Medicine

## 2012-09-07 IMAGING — US US OB NUCHAL TRANSLUCENCY 1ST GEST - [facility] NRPT
2 series · 14 of 28 positions shown · non-contrast
Comparison: none

[Series 1: us ob nuchal translucency 1st gest - [facility] nrpt · 0.14mm/px · 13 of 36 slices shown (1 of 2)]
[im 2/36]
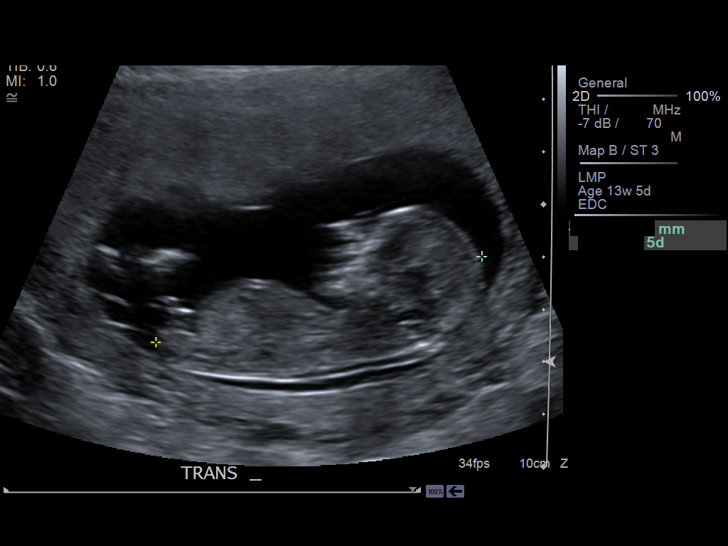
[im 5/36]
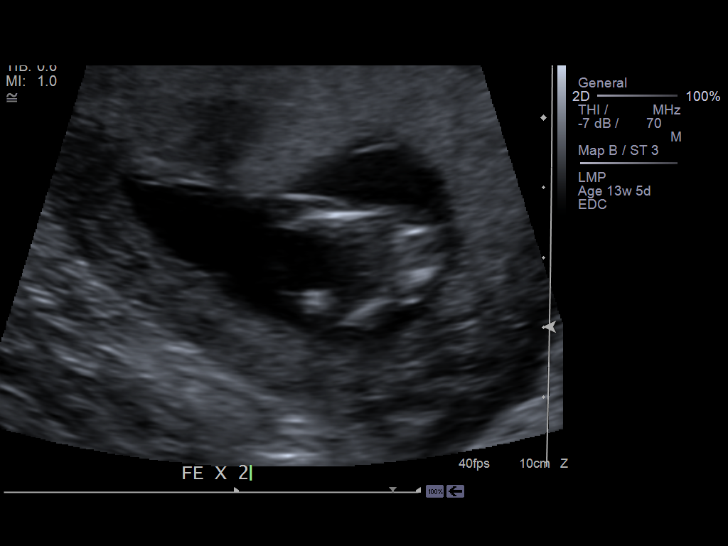
[im 7/36]
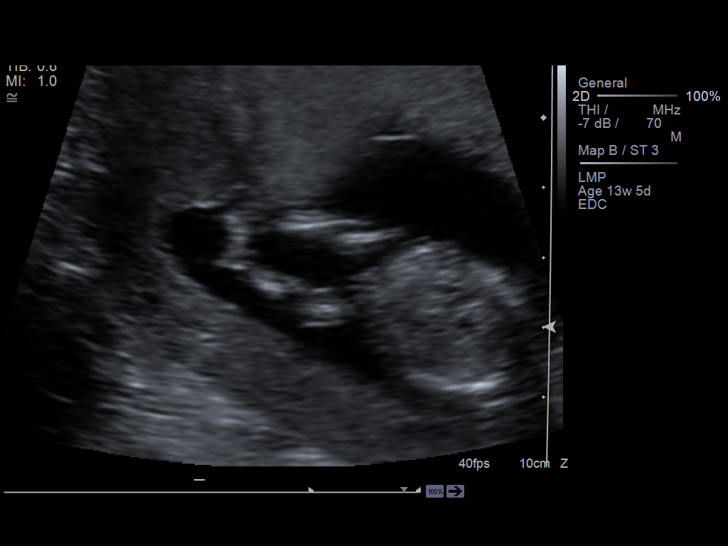
[im 10/36]
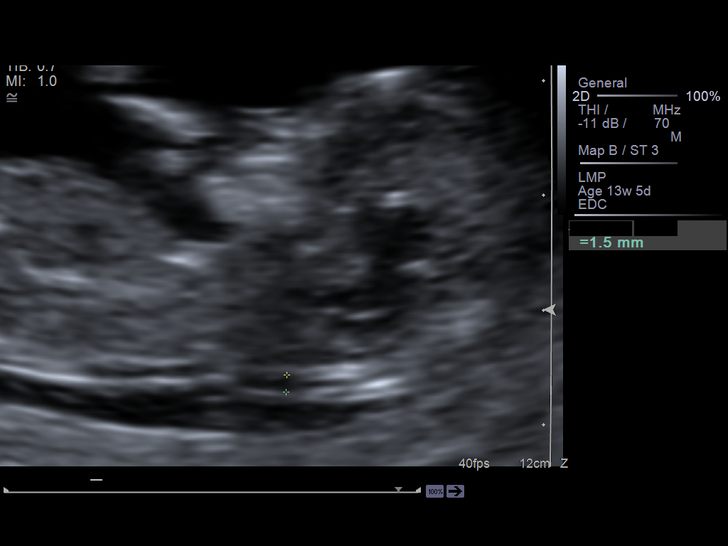
[im 13/36]
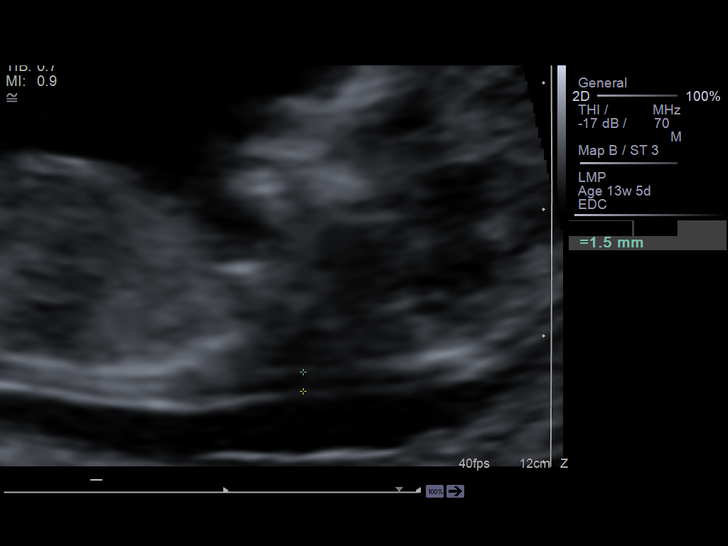
[im 15/36]
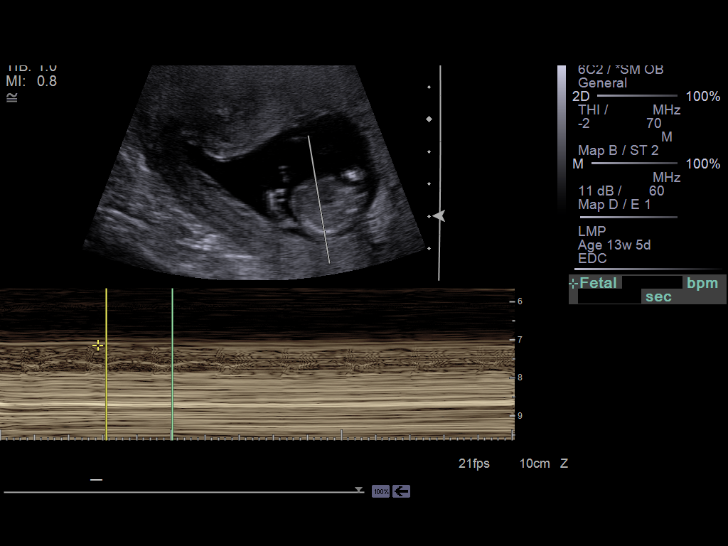
[im 18/36]
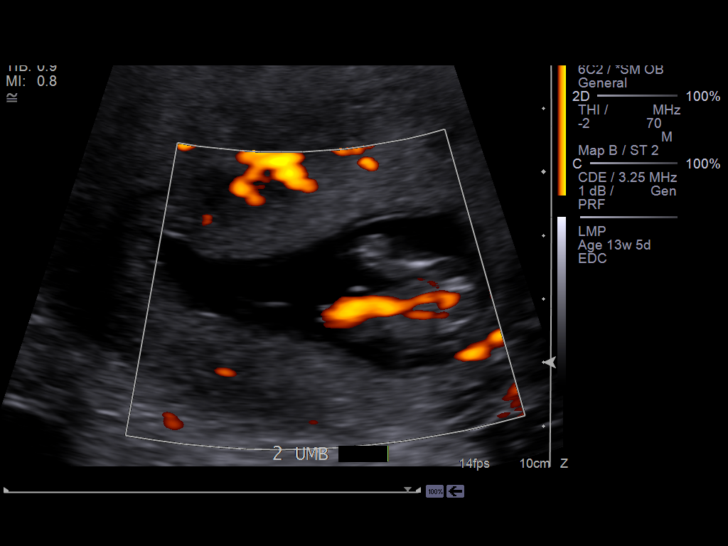
[im 21/36]
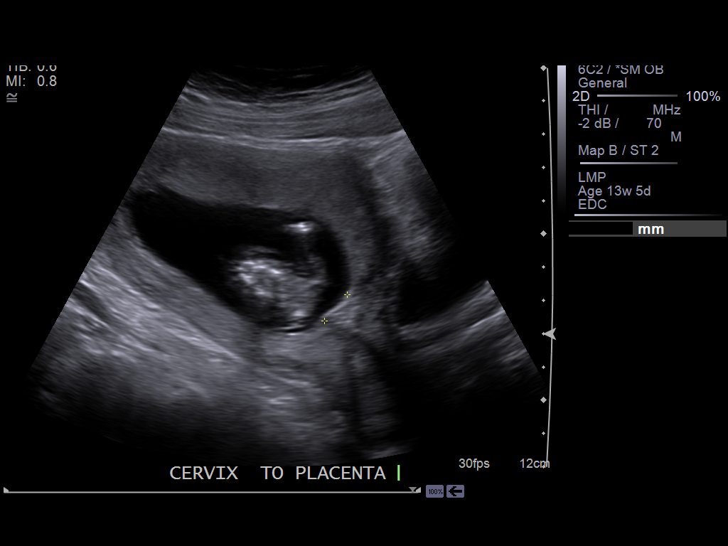
[im 23/36]
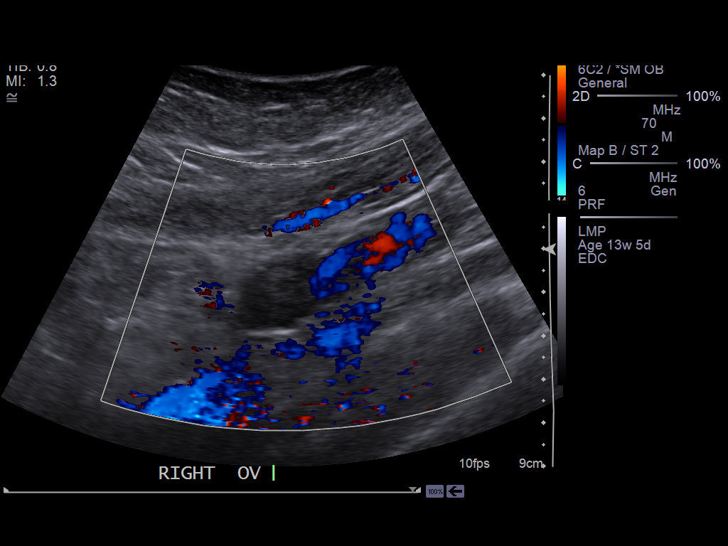
[im 26/36]
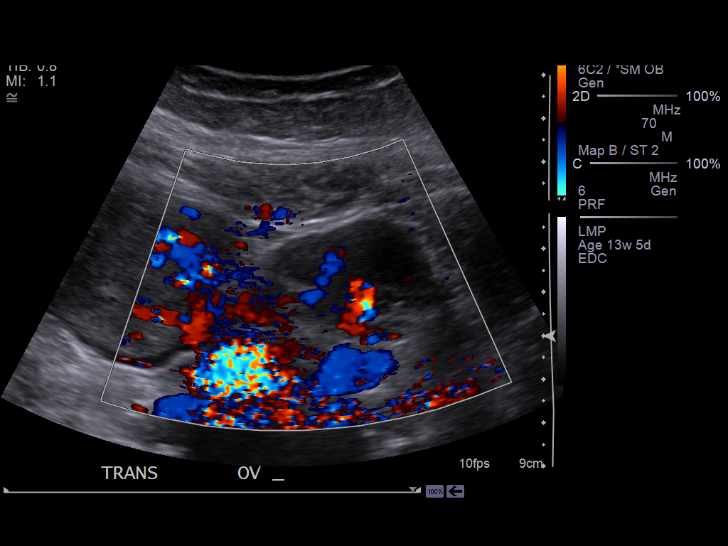
[im 29/36]
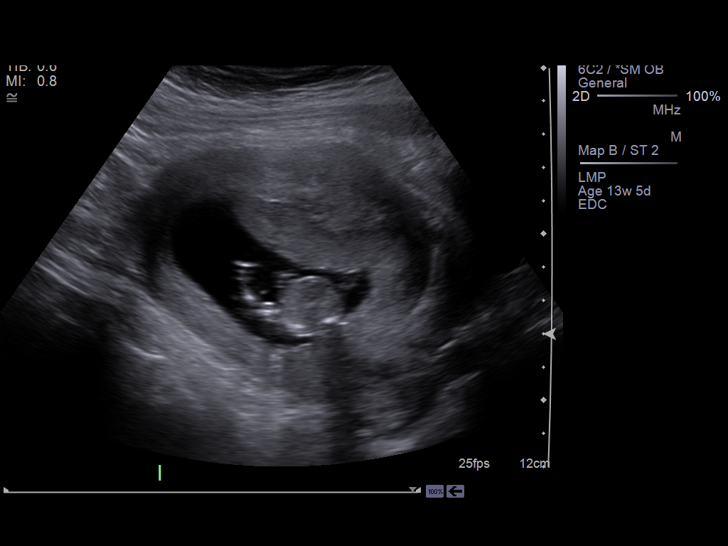
[im 31/36]
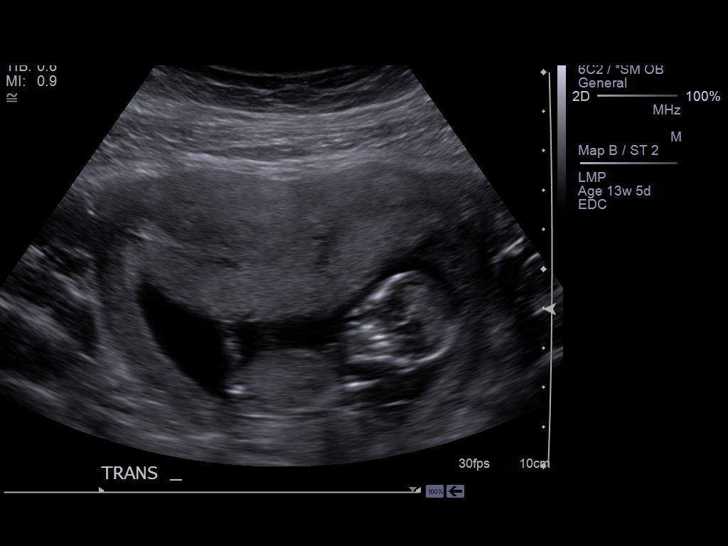
[im 34/36]
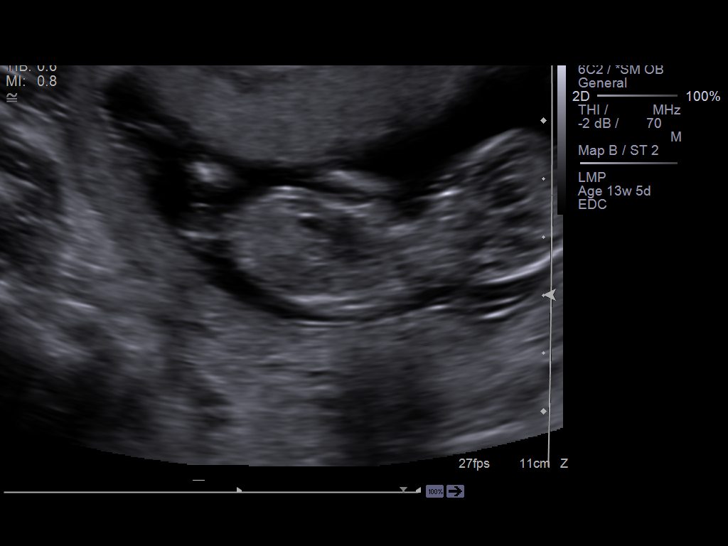

[Series 1001: us ob nuchal translucency 1st gest - [facility] nrpt · 0.14mm/px · 1 of 1 slices shown (2 of 2)]
[im 1/1]
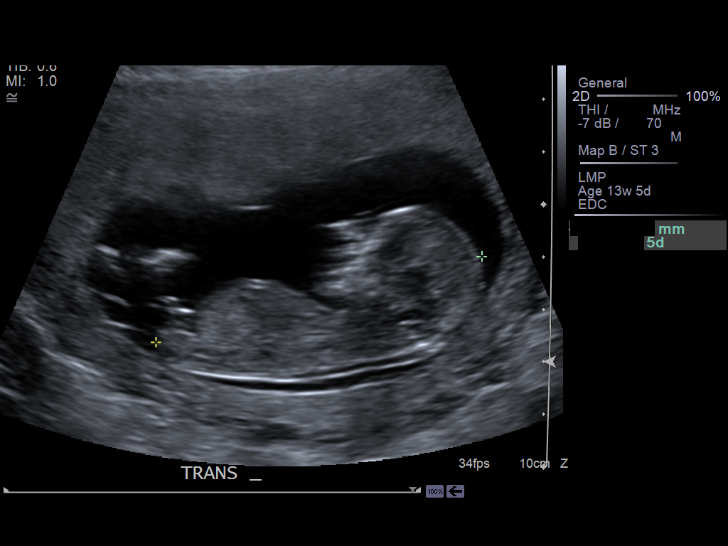

[14 of 28 positions shown; findings below may reference images not displayed]

IMAGES IMPORTED FROM THE [employer] WORKFLOW SYSTEM
NO DICTATION FOR STUDY

## 2012-12-05 ENCOUNTER — Observation Stay: Payer: Self-pay

## 2012-12-05 LAB — URINALYSIS, COMPLETE
Bacteria: NONE SEEN
Bilirubin,UR: NEGATIVE
Blood: NEGATIVE
Glucose,UR: NEGATIVE mg/dL (ref 0–75)
Leukocyte Esterase: NEGATIVE
Nitrite: NEGATIVE
Ph: 7 (ref 4.5–8.0)
Protein: NEGATIVE
RBC,UR: NONE SEEN /HPF (ref 0–5)
Specific Gravity: 1.01 (ref 1.003–1.030)
Squamous Epithelial: 6
WBC UR: 2 /HPF (ref 0–5)

## 2013-02-19 ENCOUNTER — Observation Stay: Payer: Self-pay | Admitting: Obstetrics and Gynecology

## 2013-02-19 LAB — PIH PROFILE
Anion Gap: 9 (ref 7–16)
BUN: 6 mg/dL — ABNORMAL LOW (ref 7–18)
Calcium, Total: 8.7 mg/dL (ref 8.5–10.1)
Chloride: 104 mmol/L (ref 98–107)
Co2: 23 mmol/L (ref 21–32)
Creatinine: 0.42 mg/dL — ABNORMAL LOW (ref 0.60–1.30)
EGFR (African American): >60
EGFR (Non-African Amer.): >60
Glucose: 82 mg/dL (ref 65–99)
HCT: 32.2 % — ABNORMAL LOW (ref 35.0–47.0)
HGB: 10.6 g/dL — ABNORMAL LOW (ref 12.0–16.0)
MCH: 28.1 pg (ref 26.0–34.0)
MCHC: 32.8 g/dL (ref 32.0–36.0)
MCV: 86 fL (ref 80–100)
Osmolality: 269 (ref 275–301)
Platelet: 284 10*3/uL (ref 150–440)
Potassium: 3.3 mmol/L — ABNORMAL LOW (ref 3.5–5.1)
RBC: 3.76 10*6/uL — ABNORMAL LOW (ref 3.80–5.20)
RDW: 13.1 % (ref 11.5–14.5)
SGOT(AST): 10 U/L — ABNORMAL LOW (ref 15–37)
Sodium: 136 mmol/L (ref 136–145)
Uric Acid: 3.3 mg/dL (ref 2.6–6.0)
WBC: 14 10*3/uL — ABNORMAL HIGH (ref 3.6–11.0)

## 2013-02-19 LAB — PROTEIN / CREATININE RATIO, URINE
Creatinine, Urine: 176.2 mg/dL — ABNORMAL HIGH (ref 30.0–125.0)
Protein, Random Urine: 24 mg/dL — ABNORMAL HIGH (ref 0–12)
Protein/Creat. Ratio: 136 mg/gCREAT (ref 0–200)

## 2013-03-12 ENCOUNTER — Inpatient Hospital Stay: Payer: Self-pay | Admitting: Obstetrics and Gynecology

## 2013-03-12 LAB — CBC WITH DIFFERENTIAL/PLATELET
Basophil #: 0 10*3/uL (ref 0.0–0.1)
Basophil %: 0.2 %
Eosinophil #: 0.1 10*3/uL (ref 0.0–0.7)
Eosinophil %: 0.4 %
HCT: 32 % — ABNORMAL LOW (ref 35.0–47.0)
HGB: 10.8 g/dL — ABNORMAL LOW (ref 12.0–16.0)
Lymphocyte #: 2.5 10*3/uL (ref 1.0–3.6)
Lymphocyte %: 13.5 %
MCH: 28.4 pg (ref 26.0–34.0)
MCHC: 33.7 g/dL (ref 32.0–36.0)
MCV: 84 fL (ref 80–100)
Monocyte #: 1.4 x10 3/mm — ABNORMAL HIGH (ref 0.2–0.9)
Monocyte %: 7.6 %
Neutrophil #: 14.2 10*3/uL — ABNORMAL HIGH (ref 1.4–6.5)
Neutrophil %: 78.3 %
Platelet: 250 10*3/uL (ref 150–440)
RBC: 3.8 10*6/uL (ref 3.80–5.20)
RDW: 13.7 % (ref 11.5–14.5)
WBC: 18.2 10*3/uL — ABNORMAL HIGH (ref 3.6–11.0)

## 2013-03-13 LAB — HEMATOCRIT: HCT: 29.6 % — ABNORMAL LOW (ref 35.0–47.0)

## 2014-02-08 ENCOUNTER — Emergency Department: Payer: Self-pay | Admitting: Emergency Medicine

## 2014-02-08 LAB — URINALYSIS, COMPLETE
Bacteria: NONE SEEN
Bilirubin,UR: NEGATIVE
Glucose,UR: NEGATIVE mg/dL (ref 0–75)
Leukocyte Esterase: NEGATIVE
Nitrite: NEGATIVE
Ph: 5 (ref 4.5–8.0)
Protein: NEGATIVE
RBC,UR: 3 /HPF (ref 0–5)
Specific Gravity: 1.014 (ref 1.003–1.030)
Squamous Epithelial: 2
WBC UR: 3 /HPF (ref 0–5)

## 2014-02-08 LAB — COMPREHENSIVE METABOLIC PANEL
Albumin: 4.5 g/dL (ref 3.4–5.0)
Alkaline Phosphatase: 106 U/L
Anion Gap: 5 — ABNORMAL LOW (ref 7–16)
BUN: 12 mg/dL (ref 7–18)
Bilirubin,Total: 0.6 mg/dL (ref 0.2–1.0)
Calcium, Total: 9.2 mg/dL (ref 8.5–10.1)
Chloride: 108 mmol/L — ABNORMAL HIGH (ref 98–107)
Co2: 24 mmol/L (ref 21–32)
Creatinine: 0.59 mg/dL — ABNORMAL LOW (ref 0.60–1.30)
EGFR (African American): >60
EGFR (Non-African Amer.): >60
Glucose: 88 mg/dL (ref 65–99)
Osmolality: 273 (ref 275–301)
Potassium: 3.4 mmol/L — ABNORMAL LOW (ref 3.5–5.1)
SGOT(AST): 44 U/L — ABNORMAL HIGH (ref 15–37)
SGPT (ALT): 52 U/L (ref 12–78)
Sodium: 137 mmol/L (ref 136–145)
Total Protein: 8.7 g/dL — ABNORMAL HIGH (ref 6.4–8.2)

## 2014-02-08 LAB — CBC WITH DIFFERENTIAL/PLATELET
Basophil #: 0.1 10*3/uL (ref 0.0–0.1)
Basophil %: 0.5 %
Eosinophil #: 0.1 10*3/uL (ref 0.0–0.7)
Eosinophil %: 0.5 %
HCT: 44.4 % (ref 35.0–47.0)
HGB: 14.7 g/dL (ref 12.0–16.0)
Lymphocyte #: 1.1 10*3/uL (ref 1.0–3.6)
Lymphocyte %: 10.5 %
MCH: 29.1 pg (ref 26.0–34.0)
MCHC: 33 g/dL (ref 32.0–36.0)
MCV: 88 fL (ref 80–100)
Monocyte #: 0.7 x10 3/mm (ref 0.2–0.9)
Monocyte %: 6.8 %
Neutrophil #: 8.5 10*3/uL — ABNORMAL HIGH (ref 1.4–6.5)
Neutrophil %: 81.7 %
Platelet: 292 10*3/uL (ref 150–440)
RBC: 5.04 10*6/uL (ref 3.80–5.20)
RDW: 14.5 % (ref 11.5–14.5)
WBC: 10.4 10*3/uL (ref 3.6–11.0)

## 2014-02-08 LAB — LIPASE, BLOOD: Lipase: 155 U/L (ref 73–393)

## 2014-02-08 LAB — HCG, QUANTITATIVE, PREGNANCY: Beta Hcg, Quant.: <1 m[IU]/mL — ABNORMAL LOW

## 2015-04-11 NOTE — H&P (Signed)
PATIENT NAME:  Wendy Holmes, Wendy Holmes MR#:  [id number] DATE OF BIRTH:  1986-01-28  DATE OF ADMISSION:  08/09/2012  HISTORY OF PRESENT ILLNESS:  This is a 29 year old gravida 2, para 1, last menstrual period 06/08/2012, which places her at eight weeks, five days. The patient presented to the Emergency Department with severe nausea and vomiting. The patient was evaluated in the Emergency Department by Dr. Enedina FinnerGoli. After 2 units of fluid the patient was still not feeling well enough to go home. The patient also with complaints of sore throat and cough. The [facility] It sounds like she was treated for bacterial vaginosis and treated with Flagyl which she was unable to take due to her nausea and vomiting. The patient thinks she may have a yeast infection now.  PAST MEDICAL HISTORY:  1. OCD. 2. Bipolar.  3. Cervical dysplasia.   PAST SURGICAL HISTORY:  1. Tonsillectomy and adenoidectomy. 2. PE tube placement. 3. Cholecystectomy. 4. Urolithiasis with stone removal and stent placement. Stent has been removed. 5. Cervical LEEP procedure.   REVIEW OF SYSTEMS: Unremarkable.   PAST OB HISTORY: One vaginal delivery.   MEDICATIONS:  1. Abilify.  2. Zoloft.  3. Phenergan. 4. Zofran. 5. Metronidazole.   SOCIAL HISTORY: Does not smoke, does not drink.   ALLERGIES: No known drug allergies.   PHYSICAL EXAMINATION:  GENERAL: Well-developed, well-nourished white female.   VITAL SIGNS: Blood pressure 104/66, pulse 86, temperature 98.4.   LUNGS: Clear to auscultation.   CARDIOVASCULAR: Regular rate and rhythm.   ABDOMEN: Soft, nontender.   BREASTS: Breast exam not performed.  PELVIC: Exam not performed.   Ul[facility] was performed [facility]. Findings show a crown-rump length of 1.73 cm consistent with 8 weeks, 2 days, normal yolk sac, heart rate 175. Laboratory studies show potassium of 3.2.   ASSESSMENT:   1. Hyperemesis first  trimester.  2. Hypokalemia. 3. Pharyngitis.   PLAN: The patient will be admitted to [facility]. IV support with D5 LR with 20 KCl with 1 gram of magnesium, rate 125/hour.  Zofran 4 mg q. 4 hours as needed for nausea and vomiting, Phenergan 25 mg IV every 4 to 6 hours as needed for nausea and vomiting. Tylenol 325 to 650 mg p.o. for headache and generalized aches. Vitamin B6 50 mg twice a day as needed for nausea and vomiting. The patient will be started on clear liquids and supportive care will be initiated.   ____________________________ Suzy Bouchardhomas J. Hellena Pridgen, MD tjs:bjt D:  08/09/2012 08:37:31 ET          T: 08/09/2012 09:45:51 ET        JOB#: [id number] Suzy BouchardHOMAS J Adrinne Sze MD ELECTRONICALLY SIGNED 08/09/2012 12:11

## 2015-04-11 NOTE — H&P (Signed)
PATIENT NAME:  Wendy Holmes, Wendy Holmes MR#:  [id number] DATE OF BIRTH:  [birth year]  DATE OF ADMISSION:  08/13/2012  HISTORY OF PRESENT ILLNESS: This is a 29 year old gravida 2, para 2 with last menstrual period 06/08/2012 which places her at 9 weeks estimated gestational age. The patient was previously admitted to [facility] five days ago and was discharged two days ago. The patient was admitted with hyperemesis. She underwent fluid hydration. The patient went home, was feeling okay for first day, stopped taking her antiemetic medications and started having more dizziness and was more diaphoretic with standing. The patient has been able to drink some liquids and eat food.   PAST MEDICAL HISTORY:  1. Gestational diabetes with last pregnancy. 2. Cervical dysplasia.  3. Bipolar disorder. 4. Obsessive compulsive disorder.  5. Gallstones. 6. Kidney stones.   PAST SURGICAL HISTORY:  1. Cholecystectomy laparoscopically. 2. Tonsillectomy and adenoidectomy. 3. PE tubes. 4. Urolithiasis with stenting.  5. Cervical LEEP procedure.  REVIEW OF SYSTEMS:  Per template except for PSYCH: Bipolar and OCD. GASTROINTESTINAL: Cholelithiasis status post cholecystectomy. GENITOURINARY: Cervical dysplasia status post LEEP and urolithiasis status post ureteral stenting.   FAMILY HISTORY: Unremarkable.   MEDICATIONS: Abilify, Zoloft, Phenergan, and Zofran.   SOCIAL HISTORY: She does not smoke, does not drink.   ALLERGIES: No known drug allergies.   PHYSICAL EXAMINATION:   GENERAL: Well-developed, well-nourished slightly pale female.   VITAL SIGNS: Weight is 180. Blood pressure lying down is 98/60 with a pulse of 140 and standing blood pressure is 90/62 with pulse 150.   LUNGS: Clear to auscultation.   HEART: Regular rate and rhythm.   ABDOMEN: Soft and nontender. No CVA tenderness.   PELVIC: Examination is deferred.   ASSESSMENT:  1. Hyperemesis gravidarum, first  trimester. 2. Tachycardia secondary to #1, rule out thyroid dysfunction.   PLAN: Readmission to [facility]. We will start D5 LR, liter bolus, then followed by 200 mL an hour, thyroid function test, and metabolic panel.   MEDICATIONS:  1. Doxylamine 25 mg twice a day with vitamin B6 50 mg twice a day. 2. Zofran 8 mg p.o. or 4 mg IV every 4 to 6 hours. 3. Phenergan 25 mg every 4 to 6 hours as needed for nausea and vomiting.  ____________________________ Suzy Bouchardhomas J. Livio Ledwith, MD tjs:slb D: 08/13/2012 17:20:15 ET     T: 08/13/2012 17:44:05 ET        JOB#: [id number] cc: Suzy Bouchardhomas J. Bradly Sangiovanni, MD, <Dictator> Suzy BouchardHOMAS J Ajah Vanhoose MD ELECTRONICALLY SIGNED 08/17/2012 10:21

## 2015-04-11 NOTE — Discharge Summary (Signed)
PATIENT NAME:  Wendy Holmes, [name] MR#:  [id number] DATE OF BIRTH:  1986-03-24  DATE OF ADMISSION:  08/09/2012 DATE OF DISCHARGE:  08/11/2012  HOSPITAL COURSE: The patient is a 29 year old gravida 2 para 1. The patient is 8 weeks and 5 days admitted through the Emergency Department for hyperemesis gravidarum. The patient demonstrated hypokalemia. Over the next two days the patient was advanced to a small amount of regular diet and was feeling better on discharge. She was IV hydrated. Ultrasound was normal for dating. She was discharged to home in good condition.   DISCHARGE MEDICATIONS:  1. Phenergan. 2. Zofran.   FOLLOW-UP: She will [facility] Ob[facility] or the [facility].   ____________________________ Suzy Bouchardhomas J. Bayyinah Dukeman, MD tjs:drc D: 08/19/2012 08:59:21 ET T: 08/19/2012 13:55:46 ET JOB#: [id number]  cc: Suzy Bouchardhomas J. Piotr Christopher, MD, <Dictator> Suzy BouchardHOMAS J Augustus Zurawski MD ELECTRONICALLY SIGNED 08/20/2012 11:27

## 2015-05-02 NOTE — H&P (Signed)
L&D Evaluation:  History:  HPI 29 y/o G2P0101 [facility] arrives with c/o regular contractions.. Denies leaking fluid, vaginal bleeding, baby is active. Care @ [facility] HX of bipolar disorder, OCD, obesity cervical LEEP 4/13. First pregnancy GDM PPD.GBS negative.   Presents with contractions   Patient's Medical History Bipolar disorder OCD   Patient's Surgical History none   Medications Abilify2mg  qd  Zoloft 50mg  qd   Allergies NKDA   Social History none   Family History Non-Contributory   ROS:  ROS All systems were reviewed.  HEENT, CNS, GI, GU, Respiratory, CV, Renal and Musculoskeletal systems were found to be normal.   Exam:  Vital Signs stable   Urine Protein not completed   General no apparent distress   Mental Status clear   Chest clear   Heart normal sinus rhythm   Estimated Fetal Weight Average for gestational age   Fetal Position vtx   Fundal Height term   Back no CVAT   Edema no edema   Reflexes 1+   Clonus negative   Pelvic no external lesions, 5cm upon arrival 8cm after epidural   Mebranes AROM   Description very light msaf   FHT normal rate with no decels, baseline 130's 140's avg variabillity with accels   Fetal Heart Rate 136   Ucx EFM adjusted uc's irregular after p[idural   Skin dry   Lymph no lymphadenopathy   Impression:  Impression active labor   Plan:  Plan monitor contractions and for cervical change   Comments Comfortable after epidural, will begin pitocin augment. Family supportive at bedside.   Electronic Signatures: Albertina ParrLugiano, Justina Bertini B (CNM)  (Signed 21-Mar-14 07:27)  Authored: L&D Evaluation   Last Updated: 21-Mar-14 07:27 by Albertina ParrLugiano, Brookley Spitler B (CNM)

## 2016-01-27 ENCOUNTER — Emergency Department
Admission: EM | Admit: 2016-01-27 | Discharge: 2016-01-27 | Disposition: A | Discharge status: Home / Self Care | Payer: Medicaid Other | Attending: Emergency Medicine | Admitting: Emergency Medicine

## 2016-01-27 ENCOUNTER — Encounter: Payer: Self-pay | Admitting: Emergency Medicine

## 2016-01-27 DIAGNOSIS — F419 Anxiety disorder, unspecified: Secondary | ICD-10-CM | POA: Diagnosis not present

## 2016-01-27 DIAGNOSIS — R112 Nausea with vomiting, unspecified: Secondary | ICD-10-CM | POA: Diagnosis present

## 2016-01-27 DIAGNOSIS — K529 Noninfective gastroenteritis and colitis, unspecified: Secondary | ICD-10-CM | POA: Diagnosis not present

## 2016-01-27 DIAGNOSIS — Z3202 Encounter for pregnancy test, result negative: Secondary | ICD-10-CM | POA: Diagnosis not present

## 2016-01-27 LAB — CBC
HCT: 44.7 % (ref 35.0–47.0)
Hemoglobin: 15 g/dL (ref 12.0–16.0)
MCH: 29.3 pg (ref 26.0–34.0)
MCHC: 33.6 g/dL (ref 32.0–36.0)
MCV: 87.3 fL (ref 80.0–100.0)
Platelets: 275 10*3/uL (ref 150–440)
RBC: 5.12 MIL/uL (ref 3.80–5.20)
RDW: 13.5 % (ref 11.5–14.5)
WBC: 8.2 10*3/uL (ref 3.6–11.0)

## 2016-01-27 LAB — COMPREHENSIVE METABOLIC PANEL
ALT: 22 U/L (ref 14–54)
AST: 20 U/L (ref 15–41)
Albumin: 4.6 g/dL (ref 3.5–5.0)
Alkaline Phosphatase: 112 U/L (ref 38–126)
Anion gap: 10 (ref 5–15)
BUN: 11 mg/dL (ref 6–20)
CO2: 26 mmol/L (ref 22–32)
Calcium: 9.3 mg/dL (ref 8.9–10.3)
Chloride: 105 mmol/L (ref 101–111)
Creatinine, Ser: 0.76 mg/dL (ref 0.44–1.00)
GFR calc Af Amer: >60 mL/min (ref >60)
GFR calc non Af Amer: >60 mL/min (ref >60)
Glucose, Bld: 110 mg/dL — ABNORMAL HIGH (ref 65–99)
Potassium: 3.3 mmol/L — ABNORMAL LOW (ref 3.5–5.1)
Sodium: 141 mmol/L (ref 135–145)
Total Bilirubin: 0.7 mg/dL (ref 0.3–1.2)
Total Protein: 8.7 g/dL — ABNORMAL HIGH (ref 6.5–8.1)

## 2016-01-27 LAB — URINALYSIS COMPLETE WITH MICROSCOPIC (ARMC ONLY)
Bilirubin Urine: NEGATIVE
Glucose, UA: NEGATIVE mg/dL
Hgb urine dipstick: NEGATIVE
Nitrite: NEGATIVE
Protein, ur: 30 mg/dL — AB
Specific Gravity, Urine: 1.027 (ref 1.005–1.030)
pH: 6 (ref 5.0–8.0)

## 2016-01-27 LAB — POCT PREGNANCY, URINE: Preg Test, Ur: NEGATIVE

## 2016-01-27 LAB — LIPASE, BLOOD: Lipase: 23 U/L (ref 11–51)

## 2016-01-27 MED ORDER — SODIUM CHLORIDE 0.9 % IV BOLUS (SEPSIS)
1000.0000 mL | Freq: Once | INTRAVENOUS | Status: AC
  Administered 2016-01-27: 1000 mL via INTRAVENOUS

## 2016-01-27 MED ORDER — MORPHINE SULFATE (PF) 2 MG/ML IV SOLN
2.0000 mg | Freq: Once | INTRAVENOUS | Status: AC
  Administered 2016-01-27: 2 mg via INTRAVENOUS
  Filled 2016-01-27: qty 1

## 2016-01-27 MED ORDER — ONDANSETRON HCL 4 MG/2ML IJ SOLN
4.0000 mg | Freq: Once | INTRAMUSCULAR | Status: AC
  Administered 2016-01-27: 4 mg via INTRAVENOUS
  Filled 2016-01-27: qty 2

## 2016-01-27 MED ORDER — PROMETHAZINE HCL 12.5 MG PO TABS: 12.5000 mg | ORAL_TABLET | Freq: Four times a day (QID) | ORAL | Status: DC | PRN

## 2016-01-27 MED ORDER — ONDANSETRON 4 MG PO TBDP: 4.0000 mg | ORAL_TABLET | Freq: Once | ORAL | Status: DC | PRN

## 2016-01-27 NOTE — ED Notes (Addendum)
Patient presents to the ED with nausea, vomiting, and diarrhea since yesterday.  Patient reports that she hasn't vomitted since 2am but that patient has diarrhea and has been incontinent of stool.  Patient is tearful during triage.  Reports being surprised and embarrassed by the incontinence.  Patient appears uncomfortable.  Patient is complaining of centralized abdominal pain that radiates into her back.  Area is tender on palpation.

## 2016-01-27 NOTE — ED Notes (Signed)
Pt tolerated fluids and food without problem

## 2016-01-27 NOTE — ED Provider Notes (Addendum)
----------------------------------------- [facility] Emergency Department Provider Note  ____________________________________________   I have reviewed the triage vital signs and the nursing notes.   HISTORY  Chief Complaint Emesis and Diarrhea    HPI Wendy Holmes is a 30 y.o. female . He has no prior surgical history aside from cholecystectomy remotely, had both of her children sick with nausea vomiting illnesses, patient states that last night she started having significant bloody nonbilious emesis which was then followed by copious nonbloody diarrhea and abdominal cramping. The cramping is diffuse. She states it is very watery and at one point she accidentally had a transmission a fecal matter that she did not intend. No neurologic complaints otherwise. No numbness or weakness. No back pain. She does state that she has mostly epigastric discomfort when she is nauseated but when she has diarrhea she has diffuse lower abdominal cramping. She denies any fever or chills. She denies any melena or bright red blood per rectum. There is a very large community burden of gastroenteritis at this time including her own family.  History reviewed. No pertinent past medical history.  There are no active problems to display for this patient.   Past Surgical History  Procedure Laterality Date  . Cholecystectomy      No current outpatient prescriptions on file.  Allergies Review of patient's allergies indicates no known allergies.  No family history on file.  Social History Social History  Substance Use Topics  . Smoking status: Never Smoker   . Smokeless tobacco: None  . Alcohol Use: No    Review of Systems Constitutional: No fever/chills Eyes: No visual changes. ENT: No sore throat. No stiff neck no neck pain Cardiovascular: Denies chest pain. Respiratory: Denies shortness of breath. Gastrointestinal:   See history of present illness Genitourinary:  Negative for dysuria. Musculoskeletal: Negative lower extremity swelling Skin: Negative for rash. Neurological: Negative for headaches, focal weakness or numbness. 10-point ROS otherwise negative.  ____________________________________________   PHYSICAL EXAM:  VITAL SIGNS: ED Triage Vitals  Enc Vitals Group     BP 01/27/16 1208 142/93 mmHg     Pulse Rate 01/27/16 1208 85     Resp 01/27/16 1208 20     Temp 01/27/16 1208 98 F (36.7 C)     Temp Source 01/27/16 1208 Oral     SpO2 01/27/16 1208 100 %     Weight 01/27/16 1208 220 lb (99.791 kg)     Height 01/27/16 1208  (1.626 m)     Head Cir --      Peak Flow --      Pain Score 01/27/16 1158 8     Pain Loc --      Pain Edu? --      Excl. in GC? --     Constitutional: Alert and oriented. Well appearing and in no acute distress. Anxious and upset but nontoxic Eyes: Conjunctivae are normal. PERRL. EOMI. Head: Atraumatic. Nose: No congestion/rhinnorhea. Mouth/Throat: Mucous membranes are slightly dry.  Oropharynx non-erythematous. Neck: No stridor.   Nontender with no meningismus Cardiovascular: Normal rate, regular rhythm. Grossly normal heart sounds.  Good peripheral circulation. Respiratory: Normal respiratory effort.  No retractions. Lungs CTAB. Abdominal: Soft and minimal diffuse tenderness noted. No distention. No guarding no rebound no peritoneal signs, nonsurgical abdomen, morbidly obese noted Back:  There is no focal tenderness or step off there is no midline tenderness there are no lesions noted. there is no CVA tenderness Musculoskeletal: No lower extremity tenderness. No joint effusions,  no DVT signs strong distal pulses no edema Neurologic:  Normal speech and language. No gross focal neurologic deficits are appreciated.  Skin:  Skin is warm, dry and intact. No rash noted. Psychiatric: Mood and affect are anxious. Speech and behavior are normal.  ____________________________________________   LABS (all labs  ordered are listed, but only abnormal results are displayed)  Labs Reviewed  CBC  LIPASE, BLOOD  COMPREHENSIVE METABOLIC PANEL  URINALYSIS COMPLETEWITH MICROSCOPIC ([facility] ONLY)  POC URINE PREG, ED   ____________________________________________  EKG  I personally interpreted any EKGs ordered by me or triage  ____________________________________________  RADIOLOGY  I reviewed any imaging ordered by me or triage that were performed during my shift ____________________________________________   PROCEDURES  Procedure(s) performed: None  Critical Care performed: None  ____________________________________________   INITIAL IMPRESSION / ASSESSMENT AND PLAN / ED COURSE  Pertinent labs & imaging results that were available during my care of the patient were reviewed by me and considered in my medical decision making (see chart for details).  Patient with nausea vomiting and diarrhea she does not have a surgical abdomen we'll give her IV fluid and antiemetics and reassess. Patient is anxious about her disease process but there is no evidence of surgical pathology at this time nor is there evidence of neurologic pathology. I explained to her that I cannot cure what is likely a viral gastroenteritis that we can certainly ameliorate the symptoms. Patient vital signs are reassuring. I anticipate she may have a elevated white count but we will check blood work as a precaution.  ----------------------------------------- 2:41 PM on 01/27/2016 -----------------------------------------  Serial abdominal exams are completely benign. Vital signs are reassuring blood work is reassuring no evidence of acute intra-abdominal pathology patient feeling much better sleeping in the room when I wake her up she states she is no longer nauseated to light to try by mouth and see if she can go home. House is noted. This may not be a clean catch sample. We'll send a urine culture. I think that giving  antibiotics to patient his present complaint is a diarrheal illness with no evidence of urinary tract infection clinically would not be her best interest. We will follow up on the culture results.  ----------------------------------------- 3:15 PM on 01/27/2016 -----------------------------------------  Did offer the patient Urinate she does agree that was not a clean catch however, she declines a catheterized urination. Therefore, she feels worse she understands she must come back in terms of dysuria and urinary frequency and she was just limitation my work up without that. ____________________________________________   FINAL CLINICAL IMPRESSION(S) / ED DIAGNOSES  Final diagnoses:  None      This chart was dictated using voice recognition software.  Despite best efforts to proofread,  errors can occur which can change meaning.     Jeanmarie Plant, MD 01/27/16 1240  Jeanmarie Plant, MD 01/27/16 1442  Jeanmarie Plant, MD 01/27/16 1444  Jeanmarie Plant, MD 01/27/16 1444  Jeanmarie Plant, MD 01/27/16 [phone]

## 2016-01-29 LAB — URINE CULTURE

## 2016-06-09 ENCOUNTER — Emergency Department: Payer: Medicaid Other

## 2016-06-09 ENCOUNTER — Emergency Department
Admission: EM | Admit: 2016-06-09 | Discharge: 2016-06-09 | Disposition: A | Discharge status: Home / Self Care | Payer: Medicaid Other | Attending: Emergency Medicine | Admitting: Emergency Medicine

## 2016-06-09 DIAGNOSIS — M25562 Pain in left knee: Secondary | ICD-10-CM | POA: Insufficient documentation

## 2016-06-09 IMAGING — CR DG KNEE COMPLETE 4+V*L*
1 series · 4 of 4 positions shown · non-contrast
Comparison: None.

CLINICAL DATA: Left knee pain x 1 week; pt states it became worse
after being at a softball tournament yesterday; pt with
weight-bearing.

EXAM:
LEFT KNEE - COMPLETE 4+ VIEW

[Series 1: dg knee complete 4 views left · 0.14mm/px · 4 of 4 slices shown]
[im 1/4]
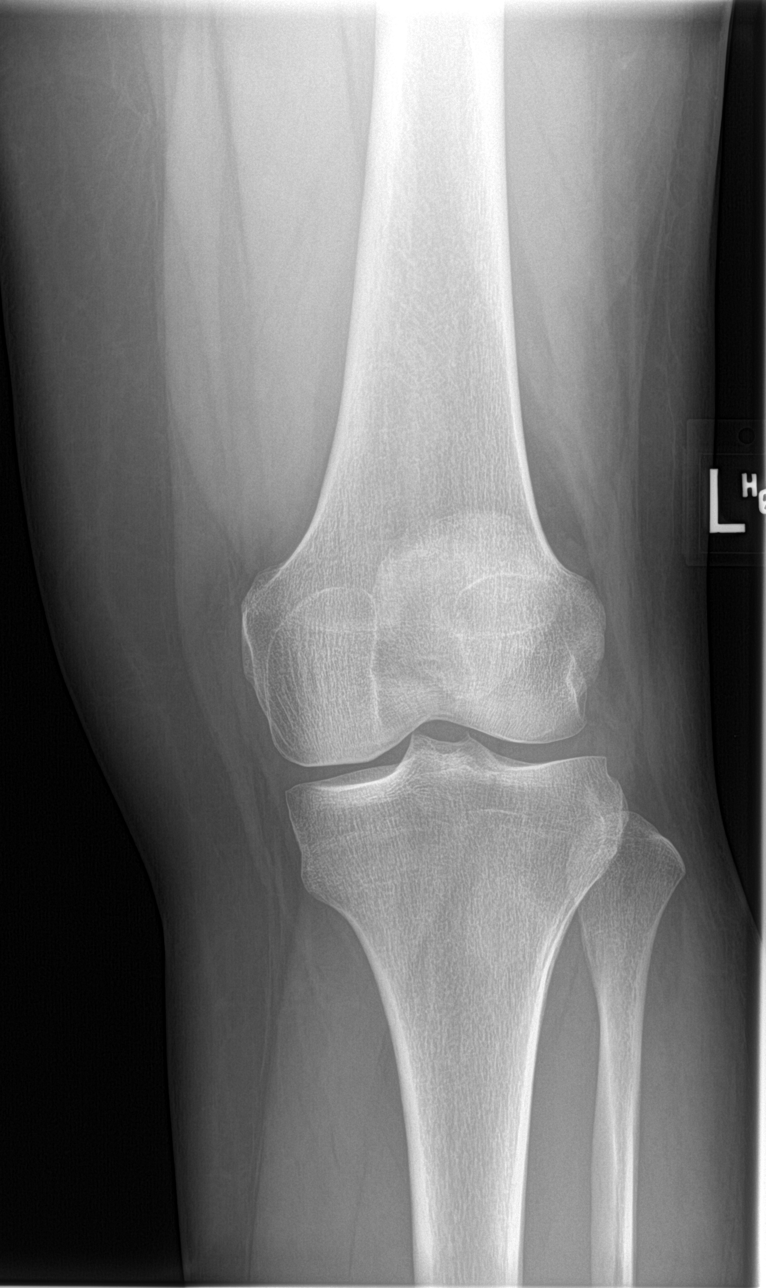
[im 2/4]
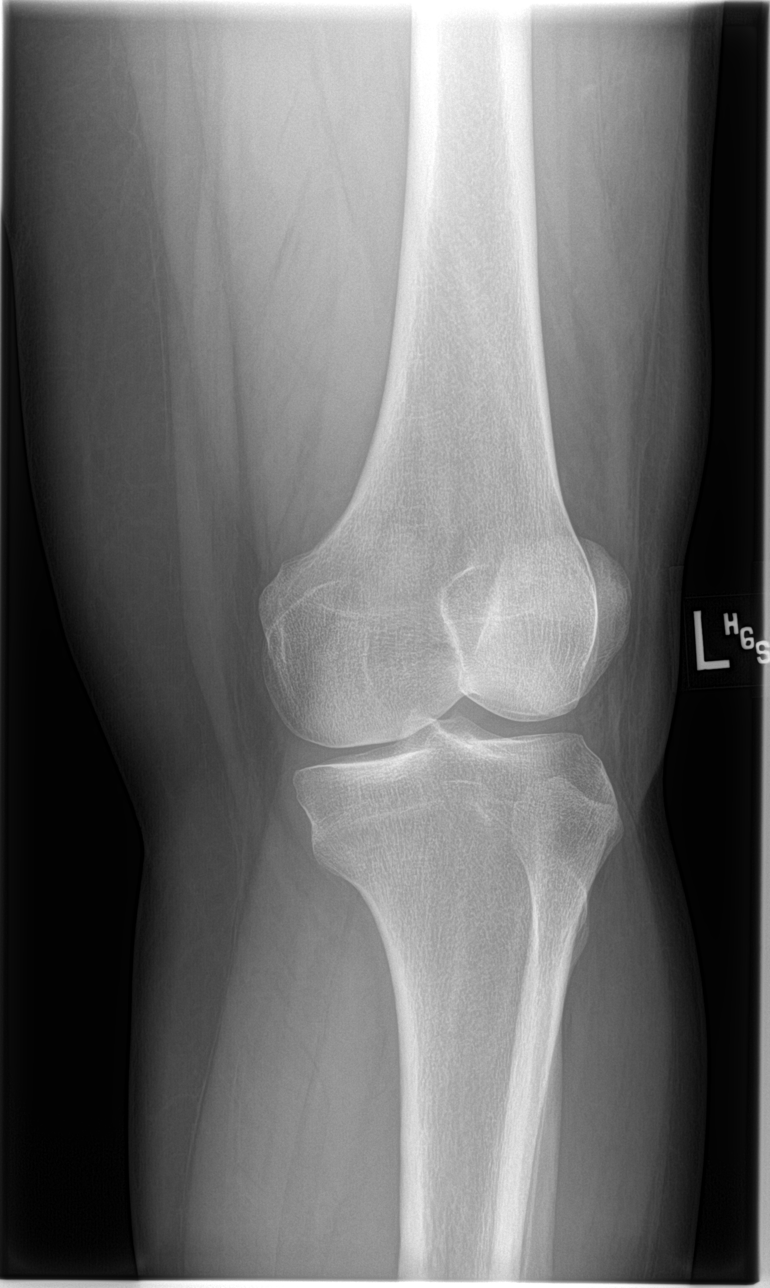
[im 3/4]
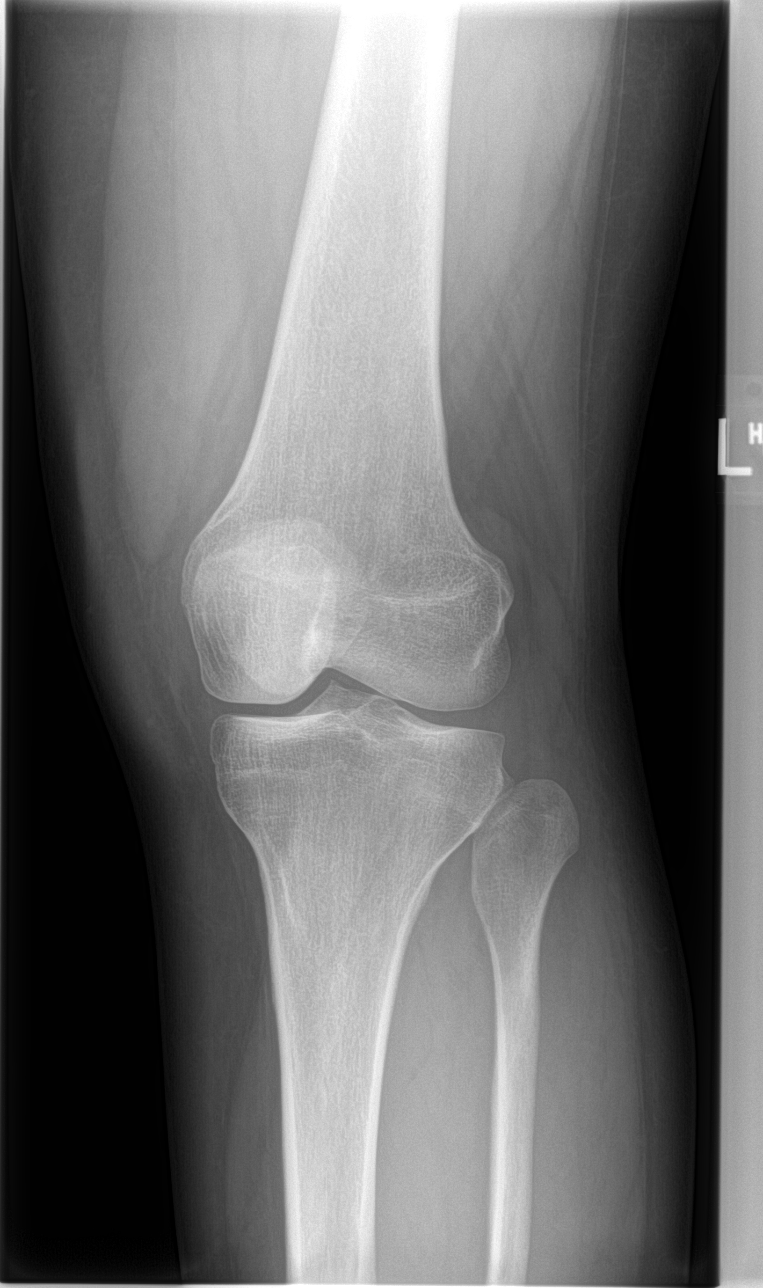
[im 4/4]
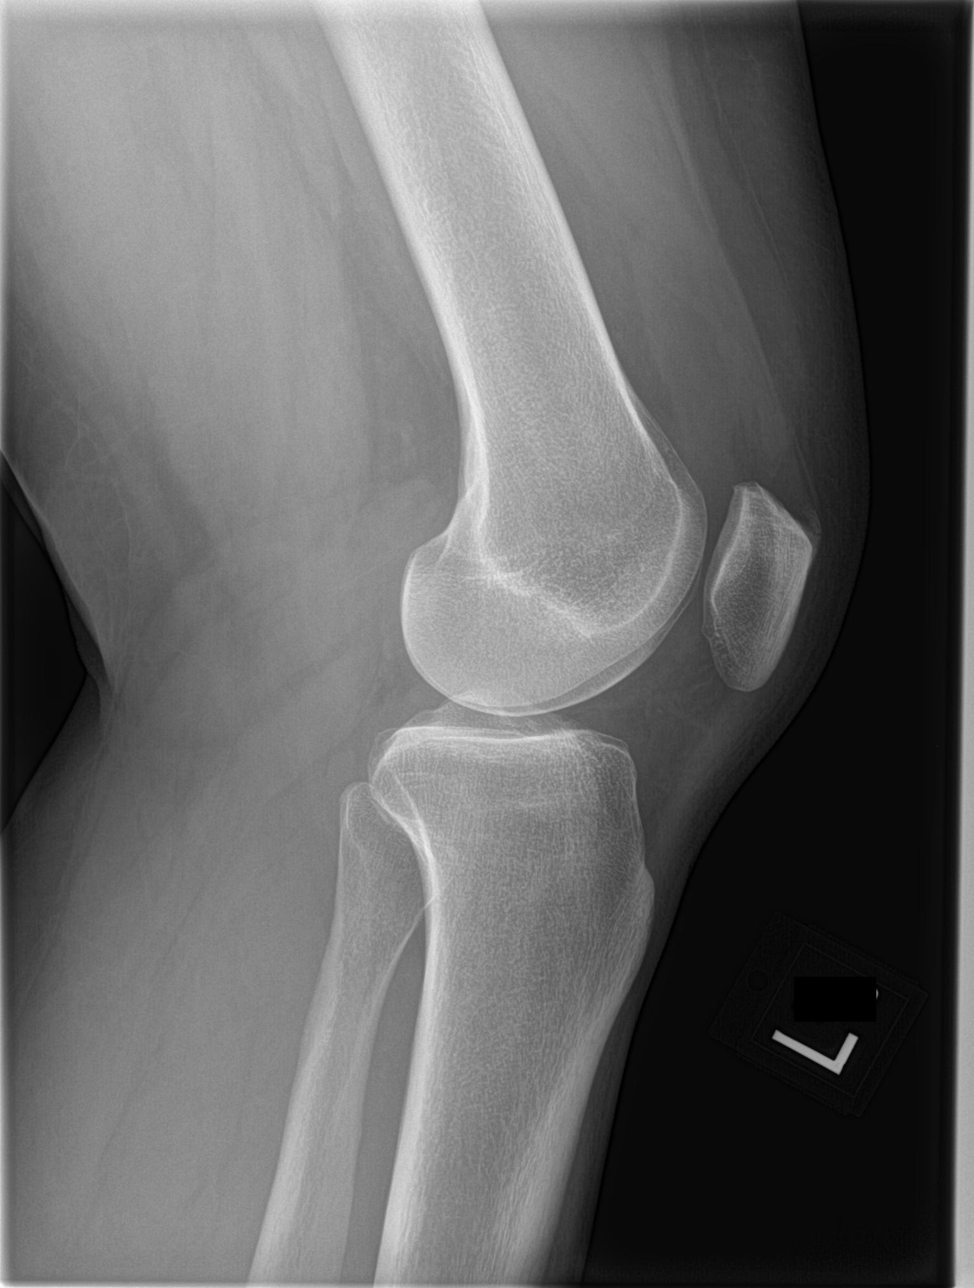

[4 of 4 positions shown; findings below may reference images not displayed]

FINDINGS: No fracture.  No bone lesion.

Knee joint is normally spaced and aligned.  No arthropathic change.

Small joint effusion noted in the suprapatellar joint capsule. Soft
tissues otherwise unremarkable.
IMPRESSION: 1. No fracture.  No arthropathic change.
2. Small joint effusion.

## 2016-06-09 MED ORDER — IBUPROFEN 600 MG PO TABS: 600.0000 mg | ORAL_TABLET | Freq: Three times a day (TID) | ORAL | Status: DC | PRN

## 2016-06-09 NOTE — ED Provider Notes (Signed)
[facility] Emergency Department Provider Note   ____________________________________________  Time seen: Approximately 12:44 PM  I have reviewed the triage vital signs and the nursing notes.   HISTORY  Chief Complaint Knee Pain    HPI Wendy Holmes is a 30 y.o. female is here with complaint of left knee pain for approximately one week. Patient is unaware of any injury and denies any previous problems with her knee.Patient has been limping since her pain started. There is been no redness or heat to the area. Patient states the only things that she has done recently with stent several hours during a ball tournament but still did not have an injury. Patient states she did take 1 tablet of ibuprofen belonging to someone else. Currently she rates her pain as 6/10.   No past medical history on file.  There are no active problems to display for this patient.   Past Surgical History  Procedure Laterality Date  . Cholecystectomy      Current Outpatient Rx  Name  Route  Sig  Dispense  Refill  . ibuprofen (ADVIL,MOTRIN) 600 MG tablet   Oral   Take 1 tablet (600 mg total) by mouth every 8 (eight) hours as needed.   30 tablet   0   . promethazine (PHENERGAN) 12.5 MG tablet   Oral   Take 1 tablet (12.5 mg total) by mouth every 6 (six) hours as needed for nausea or vomiting.   10 tablet   0     Allergies Review of patient's allergies indicates no known allergies.  No family history on file.  Social History Social History  Substance Use Topics  . Smoking status: Never Smoker   . Smokeless tobacco: Not on file  . Alcohol Use: No    Review of Systems Constitutional: No fever/chills Cardiovascular: Denies chest pain. Respiratory: Denies shortness of breath. Gastrointestinal:  No nausea, no vomiting.   Musculoskeletal: Negative for back pain.Positive left knee pain. Skin: Negative for rash. Neurological: Negative for headaches, focal  weakness or numbness.  10-point ROS otherwise negative.  ____________________________________________   PHYSICAL EXAM:  VITAL SIGNS: ED Triage Vitals  Enc Vitals Group     BP 06/09/16 1237 116/66 mmHg     Pulse Rate 06/09/16 1237 92     Resp 06/09/16 1237 18     Temp 06/09/16 1237 99.1 F (37.3 C)     Temp Source 06/09/16 1237 Oral     SpO2 06/09/16 1237 100 %     Weight 06/09/16 1237 215 lb (97.523 kg)     Height 06/09/16 1237 5\' 4"  (1.626 m)     Head Cir --      Peak Flow --      Pain Score 06/09/16 1238 6     Pain Loc --      Pain Edu? --      Excl. in GC? --     Constitutional: Alert and oriented. Well appearing and in no acute distress. Eyes: Conjunctivae are normal. PERRL. EOMI. Head: Atraumatic. Nose: No congestion/rhinnorhea. Neck: No stridor.   Cardiovascular: Normal rate, regular rhythm. Grossly normal heart sounds.  Good peripheral circulation. Respiratory: Normal respiratory effort.  No retractions. Lungs CTAB. Musculoskeletal: On examination of the left knee there is no gross deformity and no effusion is noted. There is no crepitus with range of motion although range of motion is slightly restricted secondary to discomfort. Ligaments are stable bilaterally. There is soft tissue tenderness anterior aspect of the  left knee. There is no gross edema in comparison to the right. Neurologic:  Normal speech and language. No gross focal neurologic deficits are appreciated. No gait instability. Skin:  Skin is warm, dry and intact. No rash noted. No ecchymosis, abrasions, or erythema is present. Psychiatric: Mood and affect are normal. Speech and behavior are normal.  ____________________________________________   LABS (all labs ordered are listed, but only abnormal results are displayed)  Labs Reviewed - No data to display  RADIOLOGY  Left knee no fractures present per radiologist but small joint effusion is present. I, Tommi Rumps, personally viewed and  evaluated these images (plain radiographs) as part of my medical decision making, as well as reviewing the written report by the radiologist. ____________________________________________   PROCEDURES  Procedure(s) performed: None  Critical Care performed: No  ____________________________________________   INITIAL IMPRESSION / ASSESSMENT AND PLAN / ED COURSE  Pertinent labs & imaging results that were available during my care of the patient were reviewed by me and considered in my medical decision making (see chart for details).  Patient was placed in a knee immobilizer and started on ibuprofen 600 mg 3 times a day with food. Patient is to follow-up with Dr. Joice Lofts if any continued problems with her knee. She is also encouraged to use ice and elevate as needed for pain and any swelling. ____________________________________________   FINAL CLINICAL IMPRESSION(S) / ED DIAGNOSES  Final diagnoses:  Knee pain, acute, left      NEW MEDICATIONS STARTED DURING THIS VISIT:  Discharge Medication List as of 06/09/2016  1:40 PM    START taking these medications   Details  ibuprofen (ADVIL,MOTRIN) 600 MG tablet Take 1 tablet (600 mg total) by mouth every 8 (eight) hours as needed., Starting 06/09/2016, Until Discontinued, Print         Note:  This document was prepared using Dragon voice recognition software and may include unintentional dictation errors.    Tommi Rumps, PA-C 06/09/16 1507  Jennye Moccasin, MD 06/09/16 [phone]

## 2016-06-09 NOTE — ED Notes (Signed)
Pt states her left knee has been hurting the past week, pt ambulatory with a slight limp, pt denies any injury, states that her inner knee and posterior knee are hurting

## 2016-06-09 NOTE — Discharge Instructions (Signed)
Begin Taking ibuprofen 600 mg 3 times a day with food every day. Wear knee immobilizer for support for approximately 1 week. If any continued problems she will need to follow up with your primary care doctor or Dr. Joice LoftsPoggi who is the orthopedist on call today. Ice if needed for pain or swelling.

## 2016-06-09 NOTE — ED Notes (Signed)
States she was standing for several hours yesterday during a ball tournament. Having pain to left lateral knee and lower leg  Ambulates to room with slight limp

## 2017-08-10 ENCOUNTER — Encounter: Payer: Self-pay | Admitting: Emergency Medicine

## 2017-08-10 ENCOUNTER — Emergency Department
Admission: EM | Admit: 2017-08-10 | Discharge: 2017-08-10 | Disposition: A | Discharge status: Home / Self Care | Payer: Medicaid Other | Attending: Emergency Medicine | Admitting: Emergency Medicine

## 2017-08-10 DIAGNOSIS — R197 Diarrhea, unspecified: Secondary | ICD-10-CM | POA: Diagnosis not present

## 2017-08-10 DIAGNOSIS — R109 Unspecified abdominal pain: Secondary | ICD-10-CM | POA: Diagnosis present

## 2017-08-10 DIAGNOSIS — R112 Nausea with vomiting, unspecified: Secondary | ICD-10-CM | POA: Diagnosis not present

## 2017-08-10 DIAGNOSIS — Z86001 Personal history of in-situ neoplasm of cervix uteri: Secondary | ICD-10-CM | POA: Diagnosis not present

## 2017-08-10 DIAGNOSIS — Z79899 Other long term (current) drug therapy: Secondary | ICD-10-CM | POA: Insufficient documentation

## 2017-08-10 HISTORY — DX: Bipolar disorder, unspecified: F31.9

## 2017-08-10 HISTORY — DX: Carcinoma in situ of cervix, unspecified: D06.9

## 2017-08-10 LAB — COMPREHENSIVE METABOLIC PANEL
ALT: 27 U/L (ref 14–54)
AST: 22 U/L (ref 15–41)
Albumin: 4.6 g/dL (ref 3.5–5.0)
Alkaline Phosphatase: 91 U/L (ref 38–126)
Anion gap: 8 (ref 5–15)
BUN: 9 mg/dL (ref 6–20)
CO2: 25 mmol/L (ref 22–32)
Calcium: 9.4 mg/dL (ref 8.9–10.3)
Chloride: 110 mmol/L (ref 101–111)
Creatinine, Ser: 0.85 mg/dL (ref 0.44–1.00)
GFR calc Af Amer: >60 mL/min (ref >60)
GFR calc non Af Amer: >60 mL/min (ref >60)
Glucose, Bld: 97 mg/dL (ref 65–99)
Potassium: 3.4 mmol/L — ABNORMAL LOW (ref 3.5–5.1)
Sodium: 143 mmol/L (ref 135–145)
Total Bilirubin: 0.8 mg/dL (ref 0.3–1.2)
Total Protein: 8.1 g/dL (ref 6.5–8.1)

## 2017-08-10 LAB — CBC
HCT: 41.3 % (ref 35.0–47.0)
Hemoglobin: 13.9 g/dL (ref 12.0–16.0)
MCH: 30 pg (ref 26.0–34.0)
MCHC: 33.7 g/dL (ref 32.0–36.0)
MCV: 88.9 fL (ref 80.0–100.0)
Platelets: 299 10*3/uL (ref 150–440)
RBC: 4.64 MIL/uL (ref 3.80–5.20)
RDW: 14.2 % (ref 11.5–14.5)
WBC: 10.4 10*3/uL (ref 3.6–11.0)

## 2017-08-10 LAB — URINALYSIS, COMPLETE (UACMP) WITH MICROSCOPIC
Bilirubin Urine: NEGATIVE
Glucose, UA: NEGATIVE mg/dL
Hgb urine dipstick: NEGATIVE
Ketones, ur: 20 mg/dL — AB
Leukocytes, UA: NEGATIVE
Nitrite: NEGATIVE
Protein, ur: NEGATIVE mg/dL
Specific Gravity, Urine: 1.019 (ref 1.005–1.030)
pH: 6 (ref 5.0–8.0)

## 2017-08-10 LAB — LIPASE, BLOOD: Lipase: 25 U/L (ref 11–51)

## 2017-08-10 LAB — POCT PREGNANCY, URINE: Preg Test, Ur: NEGATIVE

## 2017-08-10 LAB — PREGNANCY, URINE: Preg Test, Ur: NEGATIVE

## 2017-08-10 MED ORDER — PB-HYOSCY-ATROPINE-SCOPOLAMINE 16.2 MG PO TABS: 1.0000 | ORAL_TABLET | Freq: Three times a day (TID) | ORAL | 0 refills | Status: AC | PRN

## 2017-08-10 MED ORDER — SODIUM CHLORIDE 0.9 % IV BOLUS (SEPSIS)
1000.0000 mL | Freq: Once | INTRAVENOUS | Status: AC
  Administered 2017-08-10: 1000 mL via INTRAVENOUS

## 2017-08-10 MED ORDER — ONDANSETRON HCL 4 MG/2ML IJ SOLN
4.0000 mg | Freq: Once | INTRAMUSCULAR | Status: AC
  Administered 2017-08-10: 4 mg via INTRAVENOUS
  Filled 2017-08-10: qty 2

## 2017-08-10 MED ORDER — FAMOTIDINE 20 MG PO TABS
20.0000 mg | ORAL_TABLET | Freq: Once | ORAL | Status: AC
  Administered 2017-08-10: 20 mg via ORAL
  Filled 2017-08-10: qty 1

## 2017-08-10 MED ORDER — ONDANSETRON HCL 4 MG PO TABS: 4.0000 mg | ORAL_TABLET | Freq: Three times a day (TID) | ORAL | 0 refills | Status: DC | PRN

## 2017-08-10 NOTE — ED Provider Notes (Signed)
[facility] Emergency Department Provider Note ____________________________________________   First MD Initiated Contact with Patient 08/10/17 1535     (approximate)  I have reviewed the triage vital signs and the nursing notes.   HISTORY  Chief Complaint Abdominal Pain and Emesis    HPI Wendy Holmes is a 31 y.o. female who presents with nausea, vomiting, and diarrhea 2 days, somewhat improving course, associated with diffuse mild abdominal pain,and with decreased by mouth intake. Patient reports some chills but no fever,and she denies urinary symptoms or flank pain. No blood in the stool or vomitus. No sick contacts, recent antibiotic use, or other recent illness.  Past Medical History:  Diagnosis Date  . Bipolar 1 disorder ([place])   . CIN III (cervical intraepithelial neoplasia III)     There are no active problems to display for this patient.   Past Surgical History:  Procedure Laterality Date  . CHOLECYSTECTOMY    . TONSILLECTOMY      Prior to Admission medications   Medication Sig Start Date End Date Taking? Authorizing Provider  ARIPiprazole (ABILIFY) 5 MG tablet Take 5 mg by mouth at bedtime.   Yes [provider]  sertraline (ZOLOFT) 50 MG tablet Take 50 mg by mouth at bedtime.   Yes [provider]  [place] alk-PHENObarbital (DONNATAL) 16.2 MG tablet Take 1 tablet by mouth every 8 (eight) hours as needed (abdominal discomfort or diarrhea). 08/10/17 08/13/17  Arta Silence, MD  ibuprofen (ADVIL,MOTRIN) 600 MG tablet Take 1 tablet (600 mg total) by mouth every 8 (eight) hours as needed. Patient not taking: Reported on 08/10/2017 06/09/16   Johnn Hai, PA-C  ondansetron (ZOFRAN) 4 MG tablet Take 1 tablet (4 mg total) by mouth every 8 (eight) hours as needed for nausea or vomiting. 08/10/17   Arta Silence, MD  promethazine (PHENERGAN) 12.5 MG tablet Take 1 tablet (12.5 mg total) by mouth every 6 (six) hours  as needed for nausea or vomiting. Patient not taking: Reported on 08/10/2017 01/27/16   Schuyler Amor, MD    Allergies Patient has no known allergies.  No family history on file.  Social History Social History  Substance Use Topics  . Smoking status: Never Smoker  . Smokeless tobacco: Never Used  . Alcohol use No    Review of Systems  Constitutional: positive for chills Eyes: No visual changes. ENT: No sore throat. Cardiovascular: Denies chest pain. Respiratory: Denies shortness of breath. Gastrointestinal: positive for nausea, vomiting, and diarrhea Genitourinary: Negative for dysuria.  Musculoskeletal: Negative for back pain. Skin: Negative for rash. Neurological: Negative for headaches, focal weakness or numbness.   ____________________________________________   PHYSICAL EXAM:  VITAL SIGNS: ED Triage Vitals  Enc Vitals Group     BP 08/10/17 1319 (!) 98/51     Pulse Rate 08/10/17 1319 84     Resp 08/10/17 1319 16     Temp 08/10/17 1319 98.4 F (36.9 C)     Temp Source 08/10/17 1319 Oral     SpO2 08/10/17 1319 99 %     Weight --      Height --      Head Circumference --      Peak Flow --      Pain Score 08/10/17 1315 7     Pain Loc --      Pain Edu? --      Excl. in [place]? --     Constitutional: Alert and oriented. Uncomfortable but not acutely ill appearing.  Eyes: Conjunctivae are normal.  Head: Atraumatic. Nose: No congestion/rhinnorhea. Mouth/Throat: Slightly dry mucous membranes. Neck: Normal range of motion.  Cardiovascular: Normal rate, regular rhythm. Grossly normal heart sounds.  Good peripheral circulation. Respiratory: Normal respiratory effort.  No retractions. Lungs CTAB. Gastrointestinal: mild diffuse discomfort to palpation, no focal tenderness. Genitourinary: No CVA tenderness. Musculoskeletal: No lower extremity edema.  Extremities warm and well perfused.  Neurologic:  Normal speech and language. No gross focal neurologic deficits are  appreciated.  Skin:  Skin is warm and dry. No rash noted. Psychiatric: Mood and affect are normal. Speech and behavior are normal.  ____________________________________________   LABS (all labs ordered are listed, but only abnormal results are displayed)  Labs Reviewed  COMPREHENSIVE METABOLIC PANEL - Abnormal; Notable for the following:       Result Value   Potassium 3.4 (*)    All other components within normal limits  URINALYSIS, COMPLETE (UACMP) WITH MICROSCOPIC - Abnormal; Notable for the following:    Color, Urine YELLOW (*)    APPearance HAZY (*)    Ketones, ur 20 (*)    Bacteria, UA RARE (*)    Squamous Epithelial / LPF 6-30 (*)    All other components within normal limits  LIPASE, BLOOD  CBC  PREGNANCY, URINE  POCT PREGNANCY, URINE   ____________________________________________  EKG   ____________________________________________  RADIOLOGY    ____________________________________________   PROCEDURES  Procedure(s) performed: No    Critical Care performed: No ____________________________________________   INITIAL IMPRESSION / ASSESSMENT AND PLAN / ED COURSE  Pertinent labs & imaging results that were available during my care of the patient were reviewed by me and considered in my medical decision making (see chart for details).  31 year old female presented 2 days nausea vomiting diarrhea and abdominal discomfort. Vital signs normal except for borderline blood pressure, patient is relatively well-appearing, and abdomen is soft with no focal tenderness.Overall presentation consistent with acute gastroenteritis versus foodborne illness. Plan for labs, fluids, symptomatic treatment, and reassess. Anticipate discharge home.    ----------------------------------------- 6:16 PM on 08/10/2017 -----------------------------------------  Pt feeling better and remains stable. Workup is negative. Patient wants to go home.  Patient also states that she had a  mechanical fall out of the bed and hit her left hand and now the IV is bothering her. I removed the IV. I examine the hand there is no bony tenderness and full range of motion at all joints. No other injuries per patient.   ____________________________________________   FINAL CLINICAL IMPRESSION(S) / ED DIAGNOSES  Final diagnoses:  Nausea vomiting and diarrhea      NEW MEDICATIONS STARTED DURING THIS VISIT:  Discharge Medication List as of 08/10/2017  6:20 PM    START taking these medications   Details  [place] alk-PHENObarbital (DONNATAL) 16.2 MG tablet Take 1 tablet by mouth every 8 (eight) hours as needed (abdominal discomfort or diarrhea)., Starting Sun 08/10/2017, Until Wed 08/13/2017, Print    ondansetron (ZOFRAN) 4 MG tablet Take 1 tablet (4 mg total) by mouth every 8 (eight) hours as needed for nausea or vomiting., Starting Sun 08/10/2017, Print         Note:  This document was prepared using Dragon voice recognition software and may include unintentional dictation errors.    Arta Silence, MD 08/11/17 Shelah Lewandowsky

## 2017-08-10 NOTE — ED Triage Notes (Signed)
Pt states that she has had n/v/d and abdominal pain x 2 days. Pt states that she is not able to keep fluids down. Pt states that she has not taken her temperature but thinks she may have had fever last night because she was having a lot of chills. Pt states that she feels really weak.

## 2017-08-10 NOTE — ED Notes (Signed)
Patient's daughter given drink and crackers.

## 2018-05-13 LAB — HM HIV SCREENING LAB: HM HIV Screening: NEGATIVE

## 2018-06-02 ENCOUNTER — Other Ambulatory Visit: Payer: Self-pay

## 2018-06-02 ENCOUNTER — Emergency Department: Payer: Medicaid Other

## 2018-06-02 ENCOUNTER — Emergency Department
Admission: EM | Admit: 2018-06-02 | Discharge: 2018-06-02 | Disposition: A | Discharge status: Home / Self Care | Payer: Medicaid Other | Attending: Emergency Medicine | Admitting: Emergency Medicine

## 2018-06-02 DIAGNOSIS — Z79899 Other long term (current) drug therapy: Secondary | ICD-10-CM | POA: Diagnosis not present

## 2018-06-02 DIAGNOSIS — R1032 Left lower quadrant pain: Secondary | ICD-10-CM | POA: Diagnosis present

## 2018-06-02 DIAGNOSIS — N132 Hydronephrosis with renal and ureteral calculous obstruction: Secondary | ICD-10-CM

## 2018-06-02 LAB — COMPREHENSIVE METABOLIC PANEL WITH GFR
ALT: 16 U/L (ref 14–54)
AST: 19 U/L (ref 15–41)
Albumin: 4.3 g/dL (ref 3.5–5.0)
Alkaline Phosphatase: 76 U/L (ref 38–126)
Anion gap: 8 (ref 5–15)
BUN: 11 mg/dL (ref 6–20)
CO2: 25 mmol/L (ref 22–32)
Calcium: 9 mg/dL (ref 8.9–10.3)
Chloride: 106 mmol/L (ref 101–111)
Creatinine, Ser: 0.66 mg/dL (ref 0.44–1.00)
GFR calc Af Amer: >60 mL/min
GFR calc non Af Amer: >60 mL/min
Glucose, Bld: 107 mg/dL — ABNORMAL HIGH (ref 65–99)
Potassium: 3.5 mmol/L (ref 3.5–5.1)
Sodium: 139 mmol/L (ref 135–145)
Total Bilirubin: 0.4 mg/dL (ref 0.3–1.2)
Total Protein: 7.9 g/dL (ref 6.5–8.1)

## 2018-06-02 LAB — URINALYSIS, COMPLETE (UACMP) WITH MICROSCOPIC
Bacteria, UA: NONE SEEN
Bilirubin Urine: NEGATIVE
Glucose, UA: NEGATIVE mg/dL
Ketones, ur: NEGATIVE mg/dL
Leukocytes, UA: NEGATIVE
Nitrite: NEGATIVE
Protein, ur: NEGATIVE mg/dL
RBC / HPF: >50 RBC/hpf — ABNORMAL HIGH (ref 0–5)
Specific Gravity, Urine: 1.012 (ref 1.005–1.030)
pH: 6 (ref 5.0–8.0)

## 2018-06-02 LAB — LIPASE, BLOOD: Lipase: 34 U/L (ref 11–51)

## 2018-06-02 LAB — WET PREP, GENITAL
Clue Cells Wet Prep HPF POC: NONE SEEN
Sperm: NONE SEEN
Trich, Wet Prep: NONE SEEN
Yeast Wet Prep HPF POC: NONE SEEN

## 2018-06-02 LAB — CBC
HCT: 41.9 % (ref 35.0–47.0)
Hemoglobin: 14.4 g/dL (ref 12.0–16.0)
MCH: 30.9 pg (ref 26.0–34.0)
MCHC: 34.2 g/dL (ref 32.0–36.0)
MCV: 90.2 fL (ref 80.0–100.0)
Platelets: 274 10*3/uL (ref 150–440)
RBC: 4.65 MIL/uL (ref 3.80–5.20)
RDW: 14.3 % (ref 11.5–14.5)
WBC: 8.7 10*3/uL (ref 3.6–11.0)

## 2018-06-02 LAB — CHLAMYDIA/NGC RT PCR (ARMC ONLY)
Chlamydia Tr: NOT DETECTED
N gonorrhoeae: NOT DETECTED

## 2018-06-02 LAB — POCT PREGNANCY, URINE: Preg Test, Ur: NEGATIVE

## 2018-06-02 IMAGING — CT CT ABD-PELV W/ CM
2 of 4 series · 15 of 46 positions shown, 17 images · IV contrast (APPLIED)
Comparison: CT scan [DATE]

CLINICAL DATA: Left upper quadrant abdominal pain and left flank
pain and nausea and vomiting since yesterday.

EXAM:
CT ABDOMEN AND PELVIS WITH CONTRAST
TECHNIQUE: Multidetector CT imaging of the abdomen and pelvis was performed
using the standard protocol following bolus administration of
intravenous contrast.
CONTRAST:  100mL OMNIPAQUE IOHEXOL 300 MG/ML  SOLN

[Series 2: routine abd/pel with · axial · 0.83mm/px · z∈[-490,-20]mm · 12 of 104 slices shown, 14 images]
[im 5/104  soft-tissue]
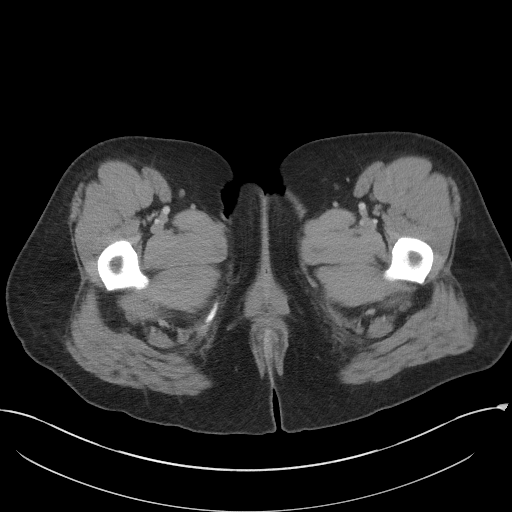
[im 5/104  bone]
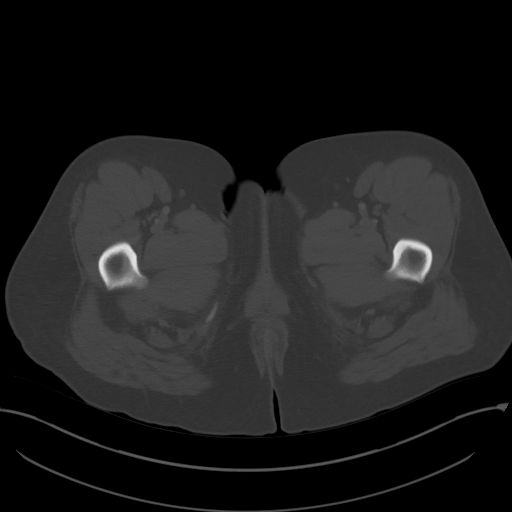
[im 13/104  soft-tissue]
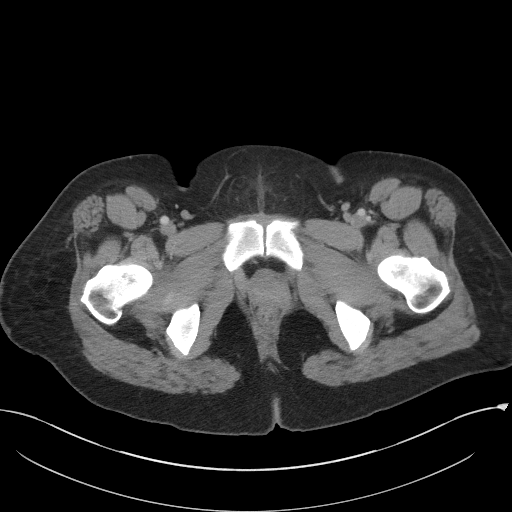
[im 22/104  soft-tissue]
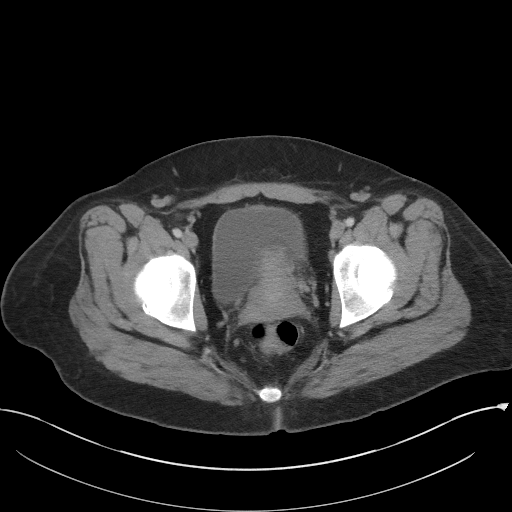
[im 31/104  soft-tissue]
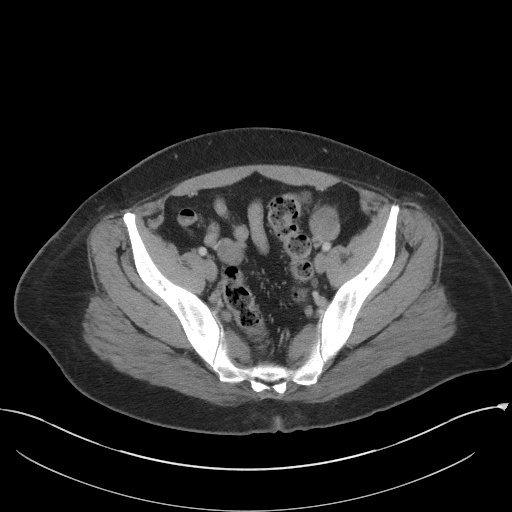
[im 39/104  soft-tissue]
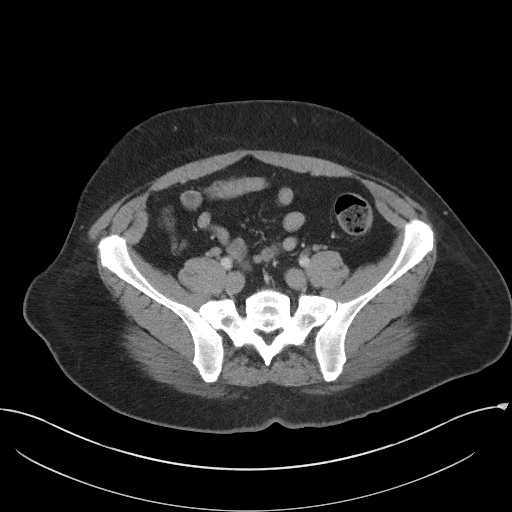
[im 48/104  soft-tissue]
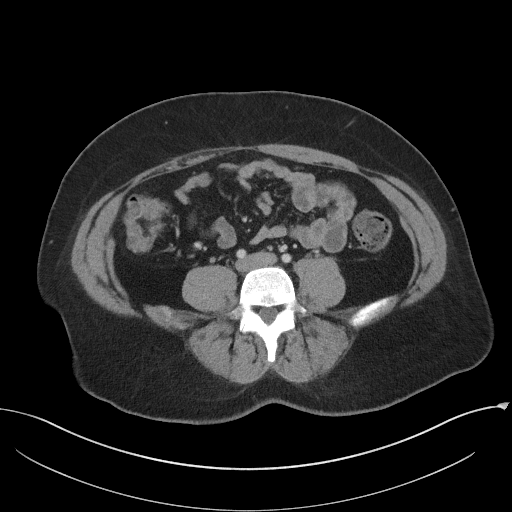
[im 56/104  soft-tissue]
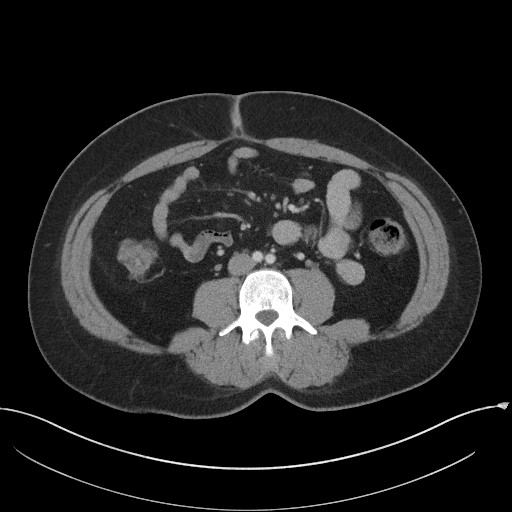
[im 65/104  soft-tissue]
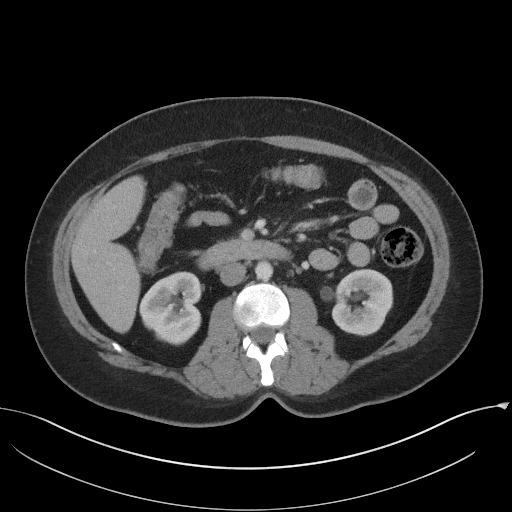
[im 73/104  soft-tissue]
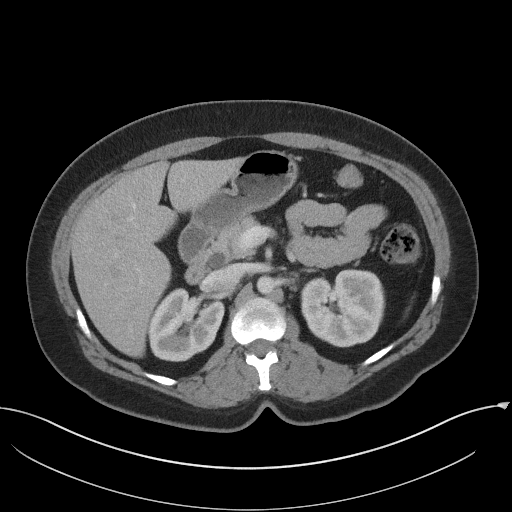
[im 73/104  bone]
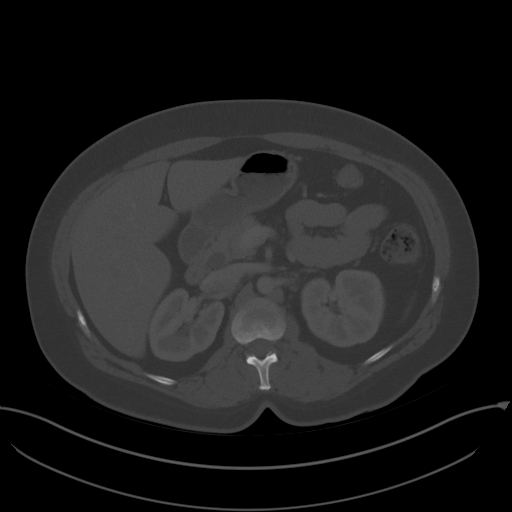
[im 82/104  soft-tissue]
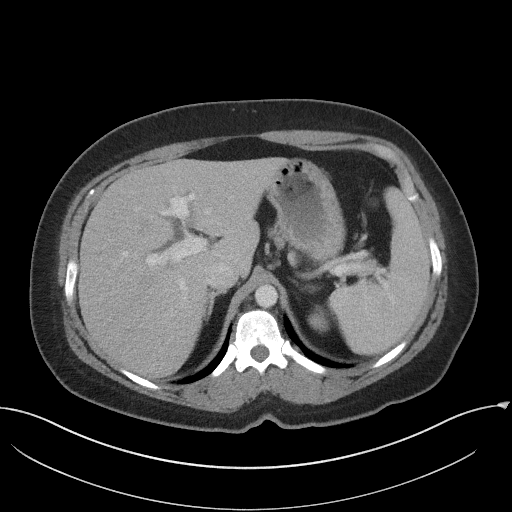
[im 91/104  soft-tissue]
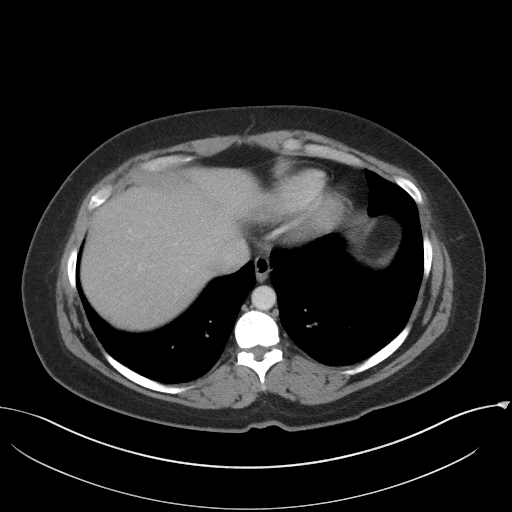
[im 99/104  soft-tissue]
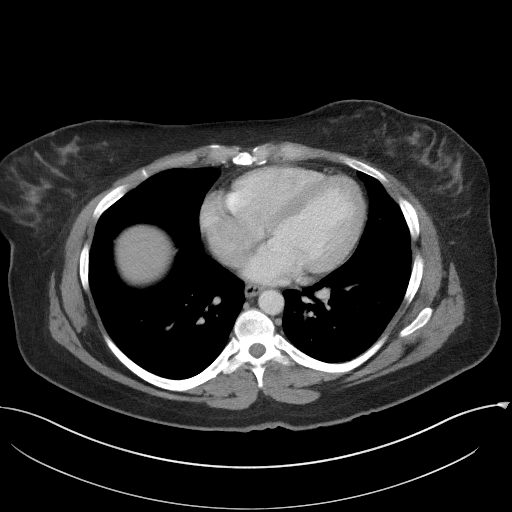

[Series 5: coronal st · coronal · 0.85mm/px · 3 of 83 slices shown]
[im 28/83  soft-tissue]
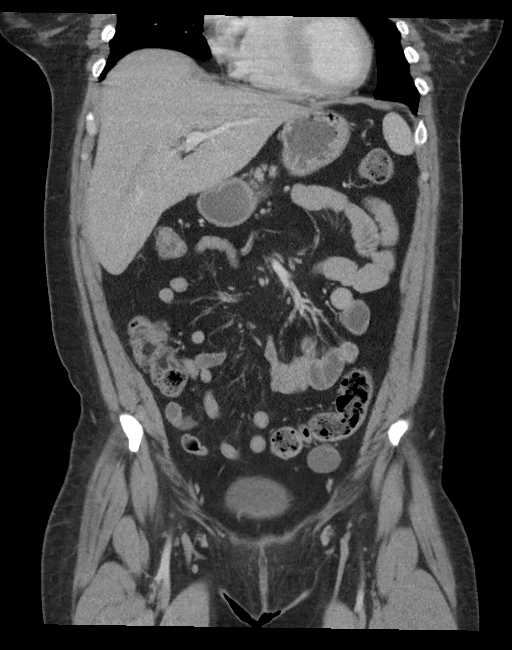
[im 37/83  soft-tissue]
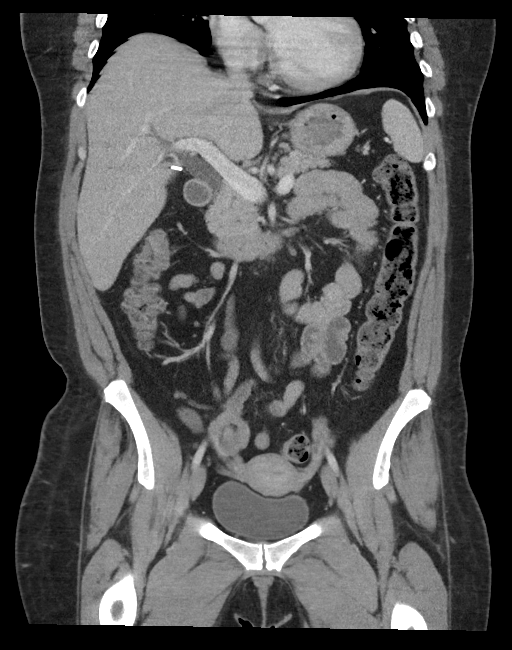
[im 46/83  soft-tissue]
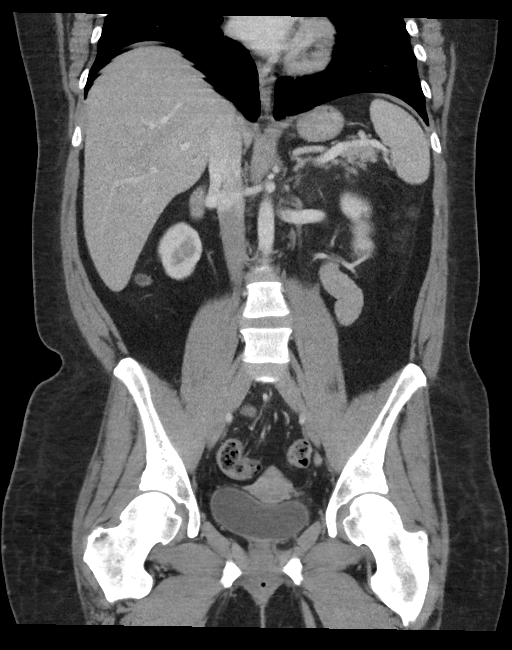

[15 of 46 positions shown; findings below may reference images not displayed]

FINDINGS: Lower chest: The lung bases are clear of acute process. No pleural
effusion or pulmonary lesions. The heart is normal in size. No
pericardial effusion. The distal esophagus and aorta are
unremarkable.

Hepatobiliary: No focal hepatic lesions or intrahepatic biliary
dilatation. The gallbladder is surgically absent. Stable mild intra
and moderate extrahepatic biliary dilatation.

Pancreas: No mass, inflammation or ductal dilatation.

Spleen: Normal size.  No focal lesions.

Adrenals/Urinary Tract: The adrenal glands are normal.

Small upper pole right renal calculus is noted. No obstructing right
ureteral calculi.

There is mild left-sided hydroureteronephrosis down to a 3.5 mm left
UVJ calculus likely explain the patient's left flank pain. No
bladder calculi. No renal or bladder mass.

Stomach/Bowel: The stomach, duodenum, small bowel and colon are
grossly normal without oral contrast. No inflammatory changes, mass
lesions or obstructive findings. The terminal ileum and appendix are
normal.

Vascular/Lymphatic: The aorta is normal in caliber. No dissection.
The branch vessels are patent. The major venous structures are
patent. No mesenteric or retroperitoneal mass or adenopathy. Small
scattered lymph nodes are noted.

Reproductive: The uterus and ovaries are unremarkable. Small simple
appearing cyst associated with the left ovary.

Other: No pelvic mass or adenopathy. No free pelvic fluid
collections. No inguinal mass or adenopathy. No abdominal wall
hernia or subcutaneous lesions.

Musculoskeletal: No significant bony findings.
IMPRESSION: 1. 3.5 mm left UVJ calculus with mild left-sided
hydroureteronephrosis.
2. Small upper pole right renal calculus but no right-sided ureteral
calculi.
3. No other significant or acute abdominal/pelvic findings and no
mass lesions or adenopathy.
4. Status post cholecystectomy with intra and extrahepatic biliary
dilatation, unchanged.

## 2018-06-02 MED ORDER — MORPHINE SULFATE (PF) 4 MG/ML IV SOLN
4.0000 mg | Freq: Once | INTRAVENOUS | Status: AC
  Administered 2018-06-02: 4 mg via INTRAVENOUS
  Filled 2018-06-02: qty 1

## 2018-06-02 MED ORDER — DOCUSATE SODIUM 100 MG PO CAPS: ORAL_CAPSULE | ORAL | 0 refills | Status: DC

## 2018-06-02 MED ORDER — ONDANSETRON HCL 4 MG/2ML IJ SOLN
4.0000 mg | INTRAMUSCULAR | Status: AC
  Administered 2018-06-02: 4 mg via INTRAVENOUS
  Filled 2018-06-02: qty 2

## 2018-06-02 MED ORDER — ONDANSETRON 4 MG PO TBDP: ORAL_TABLET | ORAL | 0 refills | Status: DC

## 2018-06-02 MED ORDER — IOHEXOL 300 MG/ML  SOLN
100.0000 mL | Freq: Once | INTRAMUSCULAR | Status: AC | PRN
  Administered 2018-06-02: 100 mL via INTRAVENOUS
  Filled 2018-06-02: qty 100

## 2018-06-02 MED ORDER — HYDROCODONE-ACETAMINOPHEN 5-325 MG PO TABS
2.0000 | ORAL_TABLET | Freq: Once | ORAL | Status: AC
  Administered 2018-06-02: 2 via ORAL
  Filled 2018-06-02: qty 2

## 2018-06-02 MED ORDER — KETOROLAC TROMETHAMINE 30 MG/ML IJ SOLN
15.0000 mg | Freq: Once | INTRAMUSCULAR | Status: AC
  Administered 2018-06-02: 15 mg via INTRAVENOUS
  Filled 2018-06-02: qty 1

## 2018-06-02 MED ORDER — HYDROCODONE-ACETAMINOPHEN 5-325 MG PO TABS: 1.0000 | ORAL_TABLET | ORAL | 0 refills | Status: DC | PRN

## 2018-06-02 MED ORDER — TAMSULOSIN HCL 0.4 MG PO CAPS: ORAL_CAPSULE | ORAL | 0 refills | Status: DC

## 2018-06-02 NOTE — ED Triage Notes (Signed)
Pt c/o LUQ pain that radiates around to the left flank and down in to the lower abd with N/V since yesterday. States it almost felt like constipation but states she had a BM yesterday.

## 2018-06-02 NOTE — ED Provider Notes (Signed)
[facility] Emergency Department Provider Note  ____________________________________________   First MD Initiated Contact with Patient 06/02/18 1553     (approximate)  I have reviewed the triage vital signs and the nursing notes.   HISTORY  Chief Complaint Abdominal Pain    HPI Wendy Holmes is a 32 y.o. female with medical history as listed below who presents for evaluation of acute onset severe LLQ pain starting about 24 hours ago.  She describes it both as sharp and aching.  Moving makes it worse, nothing makes it better.  It is accompanied with nausea and vomiting, with 3 episodes of emesis yesterday and 2 episodes today.  She does not have much appetite.  She had a normal bowel movement yesterday but otherwise she feels short of bloated as if it is constipation.  She has not had a period for 2 months but that is not uncommon because she has a Nexplanon or Implanon birth control device which makes her irregular.  She reported that she recently split from her husband and she is sexually active and sexually transmitted disease is possible but she has not been having vaginal pain or increased vaginal discharge.  She denies fever/chills, chest pain, shortness of breath, and dysuria.  Past Medical History:  Diagnosis Date  . Bipolar 1 disorder (HCC)   . CIN III (cervical intraepithelial neoplasia III)     There are no active problems to display for this patient.   Past Surgical History:  Procedure Laterality Date  . CHOLECYSTECTOMY    . TONSILLECTOMY      Prior to Admission medications   Medication Sig Start Date End Date Taking? Authorizing Provider  ARIPiprazole (ABILIFY) 5 MG tablet Take 5 mg by mouth at bedtime.    [provider]  docusate sodium (COLACE) 100 MG capsule Take 1 tablet once or twice daily as needed for constipation while taking narcotic pain medicine 06/02/18   Loleta Rose, MD  HYDROcodone-acetaminophen (NORCO/VICODIN)  5-325 MG tablet Take 1-2 tablets by mouth every 4 (four) hours as needed for moderate pain. 06/02/18   Loleta Rose, MD  ibuprofen (ADVIL,MOTRIN) 600 MG tablet Take 1 tablet (600 mg total) by mouth every 8 (eight) hours as needed. Patient not taking: Reported on 08/10/2017 06/09/16   Tommi Rumps, PA-C  ondansetron (ZOFRAN ODT) 4 MG disintegrating tablet Allow 1-2 tablets to dissolve in your mouth every 8 hours as needed for nausea/vomiting 06/02/18   Loleta Rose, MD  ondansetron (ZOFRAN) 4 MG tablet Take 1 tablet (4 mg total) by mouth every 8 (eight) hours as needed for nausea or vomiting. 08/10/17   Dionne Bucy, MD  promethazine (PHENERGAN) 12.5 MG tablet Take 1 tablet (12.5 mg total) by mouth every 6 (six) hours as needed for nausea or vomiting. Patient not taking: Reported on 08/10/2017 01/27/16   Jeanmarie Plant, MD  sertraline (ZOLOFT) 50 MG tablet Take 50 mg by mouth at bedtime.    [provider]  tamsulosin (FLOMAX) 0.4 MG CAPS capsule Take 1 tablet by mouth daily until you pass the kidney stone or no longer have symptoms 06/02/18   Loleta Rose, MD    Allergies Patient has no known allergies.  No family history on file.  Social History Social History   Tobacco Use  . Smoking status: Never Smoker  . Smokeless tobacco: Never Used  Substance Use Topics  . Alcohol use: No  . Drug use: Not on file    Review of Systems Constitutional:  No fever/chills Eyes: No visual changes. ENT: No sore throat. Cardiovascular: Denies chest pain. Respiratory: Denies shortness of breath. Gastrointestinal:LLQ pain with N/V as described above Genitourinary: Negative for dysuria. Musculoskeletal: Negative for neck pain.  Negative for back pain. Integumentary: Negative for rash. Neurological: Negative for headaches, focal weakness or numbness.   ____________________________________________   PHYSICAL EXAM:  VITAL SIGNS: ED Triage Vitals [06/02/18 1524]  Enc Vitals Group      BP 119/75     Pulse Rate 66     Resp      Temp 97.8 F (36.6 C)     Temp Source Oral     SpO2 99 %     Weight 90.7 kg (200 lb)     Height 1.626 m (5\' 4" )     Head Circumference      Peak Flow      Pain Score      Pain Loc      Pain Edu?      Excl. in GC?     Constitutional: Alert and oriented. Well appearing and in no acute distress although she does appear uncomfortable Eyes: Conjunctivae are normal.  Head: Atraumatic. Nose: No congestion/rhinnorhea. Mouth/Throat: Mucous membranes are moist. Neck: No stridor.  No meningeal signs.   Cardiovascular: Normal rate, regular rhythm. Good peripheral circulation. Grossly normal heart sounds. Respiratory: Normal respiratory effort.  No retractions. Lungs CTAB. Gastrointestinal: Obese.  Soft with tenderness to palpation in general of the left side of the abdomen, more lower left, no rebound and no guarding. Genitourinary: Normal external exam.  Cervix poorly visualized, some mucous and scant blood present.  No CMT on digital exam.  ED chaperone present throughout exam. Musculoskeletal: No lower extremity tenderness nor edema. No gross deformities of extremities. Neurologic:  Normal speech and language. No gross focal neurologic deficits are appreciated.  Skin:  Skin is warm, dry and intact. No rash noted. Psychiatric: Mood and affect are normal. Speech and behavior are normal.  ____________________________________________   LABS (all labs ordered are listed, but only abnormal results are displayed)  Labs Reviewed  WET PREP, GENITAL - Abnormal; Notable for the following components:      Result Value   WBC, Wet Prep HPF POC FEW (*)    All other components within normal limits  COMPREHENSIVE METABOLIC PANEL - Abnormal; Notable for the following components:   Glucose, Bld 107 (*)    All other components within normal limits  URINALYSIS, COMPLETE (UACMP) WITH MICROSCOPIC - Abnormal; Notable for the following components:   Color,  Urine STRAW (*)    APPearance HAZY (*)    Hgb urine dipstick LARGE (*)    RBC / HPF >50 (*)    All other components within normal limits  CHLAMYDIA/NGC RT PCR ([facility] ONLY)  LIPASE, BLOOD  CBC  POC URINE PREG, ED  POCT PREGNANCY, URINE   ____________________________________________  EKG  None - EKG not ordered by ED physician ____________________________________________  RADIOLOGY   ED MD interpretation:  3.5-mm left ureteral stone  Official radiology report(s): Ct Abdomen Pelvis W Contrast  Result Date: 06/02/2018 CLINICAL DATA:  Left upper quadrant abdominal pain and left flank pain and nausea and vomiting since yesterday. EXAM: CT ABDOMEN AND PELVIS WITH CONTRAST TECHNIQUE: Multidetector CT imaging of the abdomen and pelvis was performed using the standard protocol following bolus administration of intravenous contrast. CONTRAST:  OMNIPAQUE IOHEXOL 300 MG/ML  SOLN COMPARISON:  CT scan 01/30/2012 FINDINGS: Lower chest: The lung bases are clear of acute  process. No pleural effusion or pulmonary lesions. The heart is normal in size. No pericardial effusion. The distal esophagus and aorta are unremarkable. Hepatobiliary: No focal hepatic lesions or intrahepatic biliary dilatation. The gallbladder is surgically absent. Stable mild intra and moderate extrahepatic biliary dilatation. Pancreas: No mass, inflammation or ductal dilatation. Spleen: Normal size.  No focal lesions. Adrenals/Urinary Tract: The adrenal glands are normal. Small upper pole right renal calculus is noted. No obstructing right ureteral calculi. There is mild left-sided hydroureteronephrosis down to a 3.5 mm left UVJ calculus likely explain the patient's left flank pain. No bladder calculi. No renal or bladder mass. Stomach/Bowel: The stomach, duodenum, small bowel and colon are grossly normal without oral contrast. No inflammatory changes, mass lesions or obstructive findings. The terminal ileum and appendix  are normal. Vascular/Lymphatic: The aorta is normal in caliber. No dissection. The branch vessels are patent. The major venous structures are patent. No mesenteric or retroperitoneal mass or adenopathy. Small scattered lymph nodes are noted. Reproductive: The uterus and ovaries are unremarkable. Small simple appearing cyst associated with the left ovary. Other: No pelvic mass or adenopathy. No free pelvic fluid collections. No inguinal mass or adenopathy. No abdominal wall hernia or subcutaneous lesions. Musculoskeletal: No significant bony findings. IMPRESSION: 1. 3.5 mm left UVJ calculus with mild left-sided hydroureteronephrosis. 2. Small upper pole right renal calculus but no right-sided ureteral calculi. 3. No other significant or acute abdominal/pelvic findings and no mass lesions or adenopathy. 4. Status post cholecystectomy with intra and extrahepatic biliary dilatation, unchanged. Electronically Signed   By: Rudie MeyerP.  Gallerani M.D.   On: 06/02/2018 16:50    ____________________________________________   PROCEDURES  Critical Care performed: No   Procedure(s) performed:   Procedures   ____________________________________________   INITIAL IMPRESSION / ASSESSMENT AND PLAN / ED COURSE  As part of my medical decision making, I reviewed the following data within the electronic MEDICAL RECORD NUMBER Nursing notes reviewed and incorporated, Labs reviewed , Old chart reviewed and Notes from prior ED visits    Differential diagnosis includes, but is not limited to, diverticulitis, ovarian cyst related pain (not necessarily related to torsion), endometriosis, viral GI infection, UTI/pyelonephritis, STD/PID.  Given the tenderness and emesis in the left lower quadrant, even though she is young I think we should rule out diverticulitis.  Lab work has been reassuring with a normal conference of metabolic panel, lipase, and CBC, but diverticulitis is still possible.  Torsion is very unlikely based on the  nature of her symptoms but pain related to an ovarian cyst is still possible as well.  She could also have PID.  We will proceed with a CT scan of the abdomen and pelvis with IV contrast only as well as a pelvic exam for GC chlamydia and wet prep testing.  Morphine 4 mg IV and Zofran 4 mg IV for pain and nausea relief.  Vital signs are stable.  Clinical Course as of Jun 02 1909  Tue Jun 02, 2018  1659 3.5-mm left UVJ stone with mild hydro which certainly explains her symptoms.  STD/PID studies pending as well.  CT ABDOMEN PELVIS W CONTRAST [CF]  1737 Few WBCs on wet prep, no trich nor clue cells.  GC/chla pending  WBC, Wet Prep HPF POC(!): FEW [CF]  1804 No evidence of acute infection  Urinalysis, Complete w Microscopic(!) [CF]  1905 Gonorrhea and Chlamydia tests are negative.  I updated the patient.  She is feeling well enough that she thinks she can go home.  I encouraged outpatient follow-up with urology. I gave my usual and customary return precautions.   Chlamydia/NGC rt PCR ([facility] only) [CF]    Clinical Course User Index [CF] Loleta Rose, MD    ____________________________________________  FINAL CLINICAL IMPRESSION(S) / ED DIAGNOSES  Final diagnoses:  Ureteral stone with hydronephrosis     MEDICATIONS GIVEN DURING THIS VISIT:  Medications  HYDROcodone-acetaminophen (NORCO/VICODIN) 5-325 MG per tablet 2 tablet (has no administration in time range)  ondansetron (ZOFRAN) injection 4 mg (4 mg Intravenous Given 06/02/18 1621)  morphine 4 MG/ML injection 4 mg (4 mg Intravenous Given 06/02/18 1622)  iohexol (OMNIPAQUE) 300 MG/ML solution 100 mL (100 mLs Intravenous Contrast Given 06/02/18 1633)  ketorolac (TORADOL) 30 MG/ML injection 15 mg (15 mg Intravenous Given 06/02/18 1712)     ED Discharge Orders        Ordered    HYDROcodone-acetaminophen (NORCO/VICODIN) 5-325 MG tablet  Every 4 hours PRN     06/02/18 1908    ondansetron (ZOFRAN ODT) 4 MG disintegrating tablet      06/02/18 1908    tamsulosin (FLOMAX) 0.4 MG CAPS capsule     06/02/18 1908    docusate sodium (COLACE) 100 MG capsule     06/02/18 1908       Note:  This document was prepared using Dragon voice recognition software and may include unintentional dictation errors.    Loleta Rose, MD 06/02/18 1910

## 2018-06-02 NOTE — ED Notes (Signed)
Pt reports left sided lower abd pain that began yesterday and is radiating around to left flank.

## 2018-06-02 NOTE — Discharge Instructions (Signed)
You have been seen in the Emergency Department (ED) today for pain that we believe based on your workup, is caused by kidney stones.  As we have discussed, please drink plenty of fluids.  Please make a follow up appointment with the physician(s) listed elsewhere in this documentation. ° °You may take pain medication as needed but ONLY as prescribed.  Please also take your prescribed Flomax daily.  We also recommend that you take over-the-counter ibuprofen regularly according to label instructions over the next 5 days.  Take it with meals to minimize stomach discomfort. ° °Please see your doctor as soon as possible as stones may take 1-3 weeks to pass and you may require additional care or medications. ° °Do not drink alcohol, drive or participate in any other potentially dangerous activities while taking opiate pain medication as it may make you sleepy. Do not take this medication with any other sedating medications, either prescription or over-the-counter. If you were prescribed Percocet or Vicodin, do not take these with acetaminophen (Tylenol) as it is already contained within these medications. °  °Take Norco as needed for severe pain.  This medication is an opiate (or narcotic) pain medication and can be habit forming.  Use it as little as possible to achieve adequate pain control.  Do not use or use it with extreme caution if you have a history of opiate abuse or dependence.  If you are on a pain contract with your primary care doctor or a pain specialist, be sure to let them know you were prescribed this medication today from the [facility].  This medication is intended for your use only - do not give any to anyone else and keep it in a secure place where nobody else, especially children, have access to it.  It will also cause or worsen constipation, so you may want to consider taking an over-the-counter stool softener while you are taking this medication. ° °Return to the  Emergency Department (ED) or call your doctor if you have any worsening pain, fever, painful urination, are unable to urinate, or develop other symptoms that concern you. ° °

## 2018-06-04 ENCOUNTER — Encounter: Payer: Self-pay | Admitting: Urology

## 2018-06-04 ENCOUNTER — Ambulatory Visit: Payer: Self-pay | Admitting: Urology

## 2018-06-04 NOTE — Progress Notes (Deleted)
06/04/2018 4:39 AM   Wendy SetaHeather Rosie FateM Holmes 1986/02/25 [id number]  Referring provider: [facility] [address] [place], [place] [id number]  No chief complaint on file.   HPI: Patient is a 32 year old Caucasian female [facility] for a left ureteral stone.  She presented to the ED on June 02, 2017 with a complaint of left lower quadrant pain, nausea and vomiting.  Her UA was positive for greater than 50 RBCs.  Her WBC count was 8.7.  Her serum creatinine was 0.66.  CT renal stone study noted 3.5 mm left UVJ calculus with mild left-sided hydroureteronephrosis.  Small upper pole right renal calculus but no right-sided ureteral calculi.  No other significant or acute abdominal/pelvic findings and no mass lesions or adenopathy.  Status post cholecystectomy with intra and extrahepatic biliary dilatation, unchanged.  She was given morphine and Toradol in the ED.  She was discharged with Colace, Vicodin, Zofran and tamsulosin.  She does not have a prior history of stones.  ***     PMH: Past Medical History:  Diagnosis Date  . Bipolar 1 disorder (HCC)   . CIN III (cervical intraepithelial neoplasia III)     Surgical History: Past Surgical History:  Procedure Laterality Date  . CHOLECYSTECTOMY    . TONSILLECTOMY      Home Medications:  Allergies as of 06/04/2018   No Known Allergies     Medication List        Accurate as of 06/04/18  4:39 AM. Always use your most recent med list.          ARIPiprazole 5 MG tablet Commonly known as:  ABILIFY Take 5 mg by mouth at bedtime.   docusate sodium 100 MG capsule Commonly known as:  COLACE Take 1 tablet once or twice daily as needed for constipation while taking narcotic pain medicine   HYDROcodone-acetaminophen 5-325 MG tablet Commonly known as:  NORCO/VICODIN Take 1-2 tablets by mouth every 4 (four) hours as needed for moderate pain.   ibuprofen 600 MG tablet Commonly known as:  ADVIL,MOTRIN Take  1 tablet (600 mg total) by mouth every 8 (eight) hours as needed.   ondansetron 4 MG disintegrating tablet Commonly known as:  ZOFRAN ODT Allow 1-2 tablets to dissolve in your mouth every 8 hours as needed for nausea/vomiting   ondansetron 4 MG tablet Commonly known as:  ZOFRAN Take 1 tablet (4 mg total) by mouth every 8 (eight) hours as needed for nausea or vomiting.   promethazine 12.5 MG tablet Commonly known as:  PHENERGAN Take 1 tablet (12.5 mg total) by mouth every 6 (six) hours as needed for nausea or vomiting.   sertraline 50 MG tablet Commonly known as:  ZOLOFT Take 50 mg by mouth at bedtime.   tamsulosin 0.4 MG Caps capsule Commonly known as:  FLOMAX Take 1 tablet by mouth daily until you pass the kidney stone or no longer have symptoms       Allergies: No Known Allergies  Family History: No family history on file.  Social History:  reports that she has never smoked. She has never used smokeless tobacco. She reports that she does not drink alcohol. Her drug history is not on file.  ROS:                                        Physical Exam: There were no  vitals taken for this visit.  Constitutional:  Well nourished. Alert and oriented, No acute distress. HEENT: [place] AT, moist mucus membranes.  Trachea midline, no masses. Cardiovascular: No clubbing, cyanosis, or edema. Respiratory: Normal respiratory effort, no increased work of breathing. GI: Abdomen is soft, non tender, non distended, no abdominal masses. Liver and spleen not palpable.  No hernias appreciated.  Stool sample for occult testing is not indicated.   GU: No CVA tenderness.  No bladder fullness or masses.  Patient with circumcised/uncircumcised phallus. ***Foreskin easily retracted***  Urethral meatus is patent.  No penile discharge. No penile lesions or rashes. Scrotum without lesions, cysts, rashes and/or edema.  Testicles are located scrotally bilaterally. No masses are  appreciated in the testicles. Left and right epididymis are normal. Rectal: Patient with  normal sphincter tone. Anus and perineum without scarring or rashes. No rectal masses are appreciated. Prostate is approximately *** grams, *** nodules are appreciated. Seminal vesicles are normal. Skin: No rashes, bruises or suspicious lesions. Lymph: No cervical or inguinal adenopathy. Neurologic: Grossly intact, no focal deficits, moving all 4 extremities. Psychiatric: Normal mood and affect.  Laboratory Data: Lab Results  Component Value Date   WBC 8.7 06/02/2018   HGB 14.4 06/02/2018   HCT 41.9 06/02/2018   MCV 90.2 06/02/2018   PLT 274 06/02/2018    Lab Results  Component Value Date   CREATININE 0.66 06/02/2018    No results found for: PSA  No results found for: TESTOSTERONE  No results found for: HGBA1C  Lab Results  Component Value Date   TSH 2.16 08/13/2012    No results found for: CHOL, HDL, CHOLHDL, VLDL, LDLCALC  Lab Results  Component Value Date   AST 19 06/02/2018   Lab Results  Component Value Date   ALT 16 06/02/2018   No components found for: ALKALINEPHOPHATASE No components found for: BILIRUBINTOTAL  No results found for: ESTRADIOL  Urinalysis    Component Value Date/Time   COLORURINE STRAW (A) 06/02/2018 1535   APPEARANCEUR HAZY (A) 06/02/2018 1535   APPEARANCEUR Clear 02/08/2014 1833   LABSPEC 1.012 06/02/2018 1535   LABSPEC 1.014 02/08/2014 1833   PHURINE 6.0 06/02/2018 1535   GLUCOSEU NEGATIVE 06/02/2018 1535   GLUCOSEU Negative 02/08/2014 1833   HGBUR LARGE (A) 06/02/2018 1535   BILIRUBINUR NEGATIVE 06/02/2018 1535   BILIRUBINUR Negative 02/08/2014 1833   KETONESUR NEGATIVE 06/02/2018 1535   PROTEINUR NEGATIVE 06/02/2018 1535   NITRITE NEGATIVE 06/02/2018 1535   LEUKOCYTESUR NEGATIVE 06/02/2018 1535   LEUKOCYTESUR Negative 02/08/2014 1833    I have reviewed the labs.   Pertinent Imaging: CLINICAL DATA:  Left upper quadrant abdominal  pain and left flank pain and nausea and vomiting since yesterday.  EXAM: CT ABDOMEN AND PELVIS WITH CONTRAST  TECHNIQUE: Multidetector CT imaging of the abdomen and pelvis was performed using the standard protocol following bolus administration of intravenous contrast.  CONTRAST:  OMNIPAQUE IOHEXOL 300 MG/ML  SOLN  COMPARISON:  CT scan 01/30/2012  FINDINGS: Lower chest: The lung bases are clear of acute process. No pleural effusion or pulmonary lesions. The heart is normal in size. No pericardial effusion. The distal esophagus and aorta are unremarkable.  Hepatobiliary: No focal hepatic lesions or intrahepatic biliary dilatation. The gallbladder is surgically absent. Stable mild intra and moderate extrahepatic biliary dilatation.  Pancreas: No mass, inflammation or ductal dilatation.  Spleen: Normal size.  No focal lesions.  Adrenals/Urinary Tract: The adrenal glands are normal.  Small upper pole right renal calculus is  noted. No obstructing right ureteral calculi.  There is mild left-sided hydroureteronephrosis down to a 3.5 mm left UVJ calculus likely explain the patient's left flank pain. No bladder calculi. No renal or bladder mass.  Stomach/Bowel: The stomach, duodenum, small bowel and colon are grossly normal without oral contrast. No inflammatory changes, mass lesions or obstructive findings. The terminal ileum and appendix are normal.  Vascular/Lymphatic: The aorta is normal in caliber. No dissection. The branch vessels are patent. The major venous structures are patent. No mesenteric or retroperitoneal mass or adenopathy. Small scattered lymph nodes are noted.  Reproductive: The uterus and ovaries are unremarkable. Small simple appearing cyst associated with the left ovary.  Other: No pelvic mass or adenopathy. No free pelvic fluid collections. No inguinal mass or adenopathy. No abdominal wall hernia or subcutaneous  lesions.  Musculoskeletal: No significant bony findings.  IMPRESSION: 1. 3.5 mm left UVJ calculus with mild left-sided hydroureteronephrosis. 2. Small upper pole right renal calculus but no right-sided ureteral calculi. 3. No other significant or acute abdominal/pelvic findings and no mass lesions or adenopathy. 4. Status post cholecystectomy with intra and extrahepatic biliary dilatation, unchanged.   Electronically Signed   By: Rudie Meyer M.D.   On: 06/02/2018 16:50  I have independently reviewed the films.    Assessment & Plan:  ***  1. Left ureteral stone***  - stone sent for analysis ***  2. Left hydronephrosis***  - obtain RUS to ensure the hydronephrosis has resolved once they have passed and/or recovered from procedure to ensure to iatrogenic hydronephrosis remains - it is explained to the patient that it is important to document resolution of the hydronephrosis as "silent hydronephrosis" can occur and cause damage and/or loss of the kidney  3. *** hematuria  - UA today demonstrates ***  - continue to monitor the patient's UA after the treatment/passage of the stone to ensure the hematuria has resolved  - if hematuria persists, we will pursue a hematuria workup with CT Urogram and cystoscopy if appropriate.    No follow-ups on file.  These notes generated with voice recognition software. I apologize for typographical errors.  Michiel Cowboy, PA-C  [facility] [address]  [address] [place], [place] [address] [phone]

## 2018-06-05 ENCOUNTER — Encounter: Payer: Self-pay | Admitting: Urology

## 2018-08-14 ENCOUNTER — Emergency Department: Payer: Medicaid Other

## 2018-08-14 ENCOUNTER — Emergency Department
Admission: EM | Admit: 2018-08-14 | Discharge: 2018-08-14 | Disposition: A | Discharge status: Home / Self Care | Payer: Medicaid Other | Attending: Emergency Medicine | Admitting: Emergency Medicine

## 2018-08-14 ENCOUNTER — Encounter: Payer: Self-pay | Admitting: Emergency Medicine

## 2018-08-14 DIAGNOSIS — Z79899 Other long term (current) drug therapy: Secondary | ICD-10-CM | POA: Insufficient documentation

## 2018-08-14 DIAGNOSIS — N12 Tubulo-interstitial nephritis, not specified as acute or chronic: Secondary | ICD-10-CM

## 2018-08-14 DIAGNOSIS — R1031 Right lower quadrant pain: Secondary | ICD-10-CM | POA: Diagnosis present

## 2018-08-14 LAB — URINALYSIS, COMPLETE (UACMP) WITH MICROSCOPIC
Bacteria, UA: NONE SEEN
Bilirubin Urine: NEGATIVE
Glucose, UA: NEGATIVE mg/dL
Ketones, ur: NEGATIVE mg/dL
Nitrite: NEGATIVE
Protein, ur: NEGATIVE mg/dL
Specific Gravity, Urine: 1.005 (ref 1.005–1.030)
pH: 7 (ref 5.0–8.0)

## 2018-08-14 LAB — BASIC METABOLIC PANEL
Anion gap: 9 (ref 5–15)
BUN: 7 mg/dL (ref 6–20)
CO2: 25 mmol/L (ref 22–32)
Calcium: 8.4 mg/dL — ABNORMAL LOW (ref 8.9–10.3)
Chloride: 103 mmol/L (ref 98–111)
Creatinine, Ser: 0.67 mg/dL (ref 0.44–1.00)
GFR calc Af Amer: >60 mL/min (ref >60)
GFR calc non Af Amer: >60 mL/min (ref >60)
Glucose, Bld: 104 mg/dL — ABNORMAL HIGH (ref 70–99)
Potassium: 2.9 mmol/L — ABNORMAL LOW (ref 3.5–5.1)
Sodium: 137 mmol/L (ref 135–145)

## 2018-08-14 LAB — CBC
HCT: 38.5 % (ref 35.0–47.0)
Hemoglobin: 13.4 g/dL (ref 12.0–16.0)
MCH: 30.8 pg (ref 26.0–34.0)
MCHC: 34.9 g/dL (ref 32.0–36.0)
MCV: 88.2 fL (ref 80.0–100.0)
Platelets: 291 10*3/uL (ref 150–440)
RBC: 4.36 MIL/uL (ref 3.80–5.20)
RDW: 13.4 % (ref 11.5–14.5)
WBC: 13.5 10*3/uL — ABNORMAL HIGH (ref 3.6–11.0)

## 2018-08-14 LAB — POCT PREGNANCY, URINE: Preg Test, Ur: NEGATIVE

## 2018-08-14 IMAGING — CT CT RENAL STONE PROTOCOL
2 of 4 series · 16 of 46 positions shown, 18 images · non-contrast
Comparison: [DATE]

CLINICAL DATA: Flank pain with stone disease suspected. Progressive
worsening right-sided pain

EXAM:
CT ABDOMEN AND PELVIS WITHOUT CONTRAST
TECHNIQUE: Multidetector CT imaging of the abdomen and pelvis was performed
following the standard protocol without IV contrast.

[Series 2: stone full standard · axial · 0.77mm/px · z∈[-507,-42]mm · 13 of 103 slices shown, 15 images]
[im 5/103  soft-tissue]
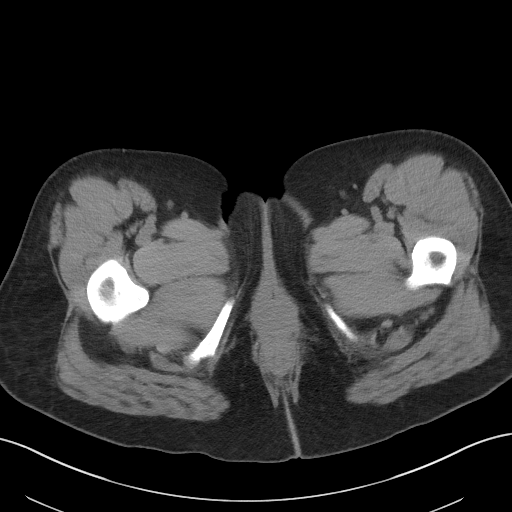
[im 5/103  bone]
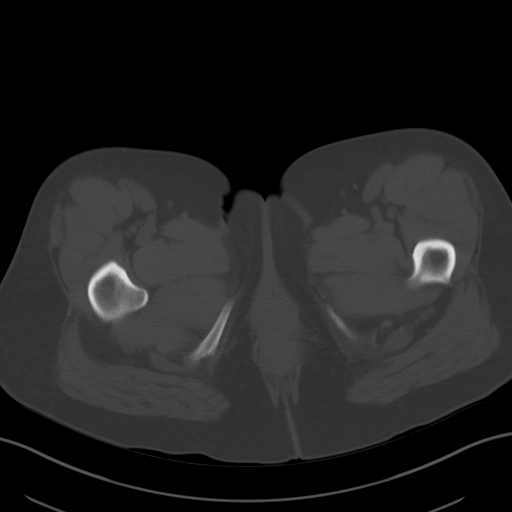
[im 13/103  soft-tissue]
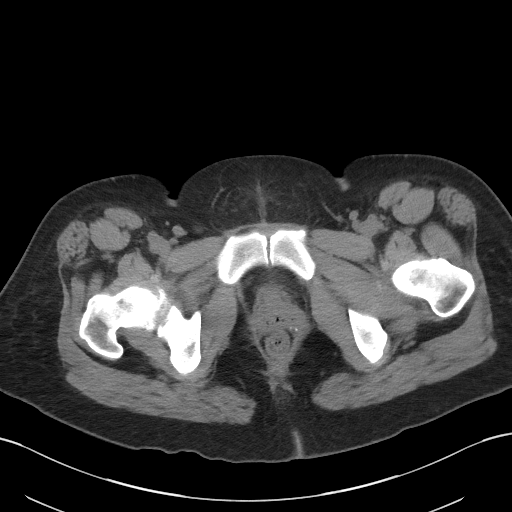
[im 21/103  soft-tissue]
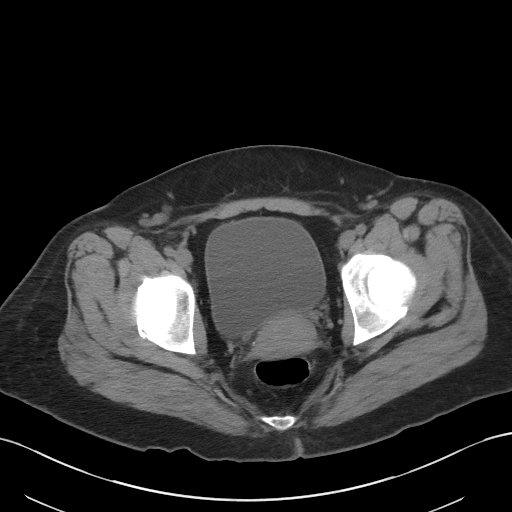
[im 29/103  soft-tissue]
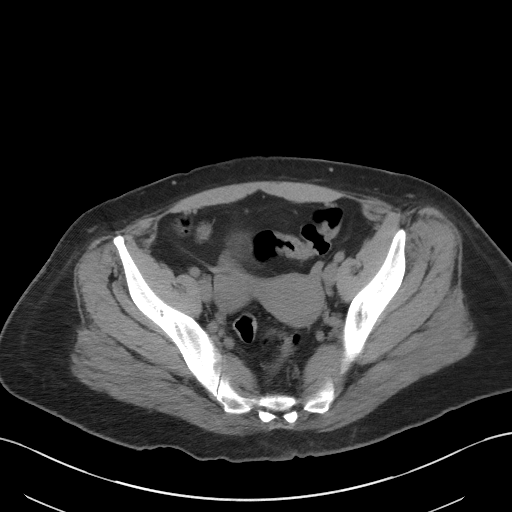
[im 37/103  soft-tissue]
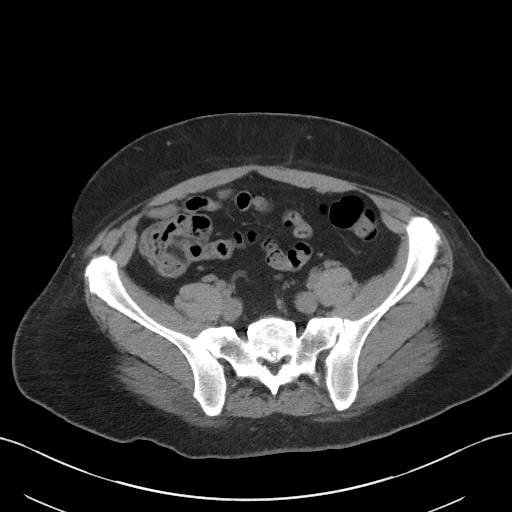
[im 45/103  soft-tissue]
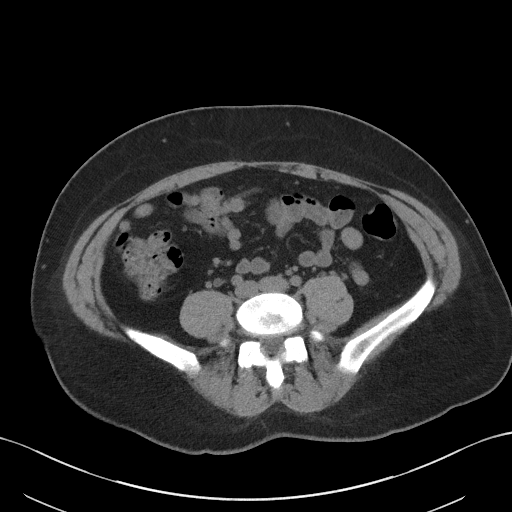
[im 54/103  soft-tissue]
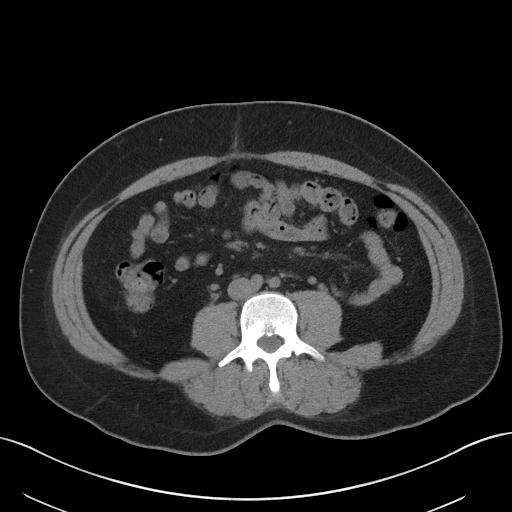
[im 58/103  soft-tissue]
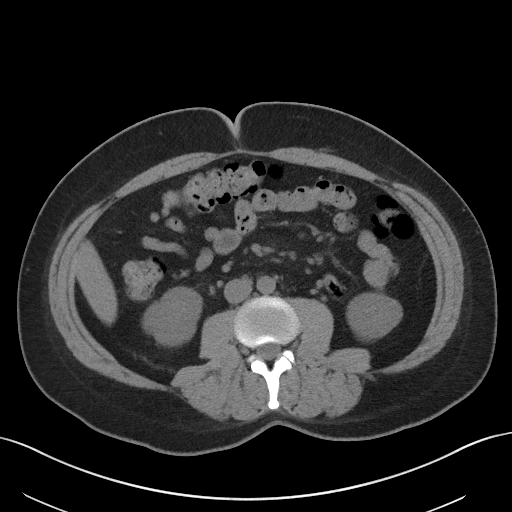
[im 66/103  soft-tissue]
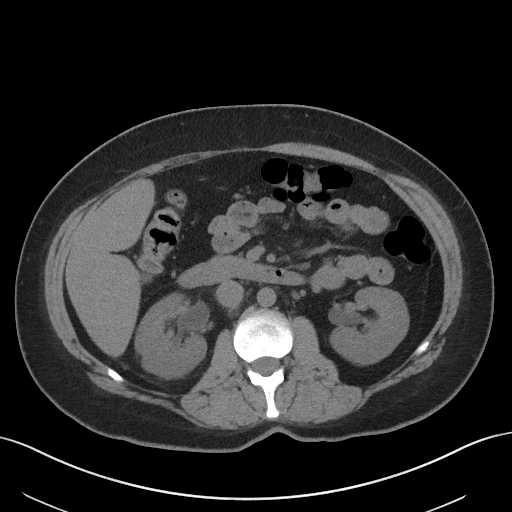
[im 66/103  bone]
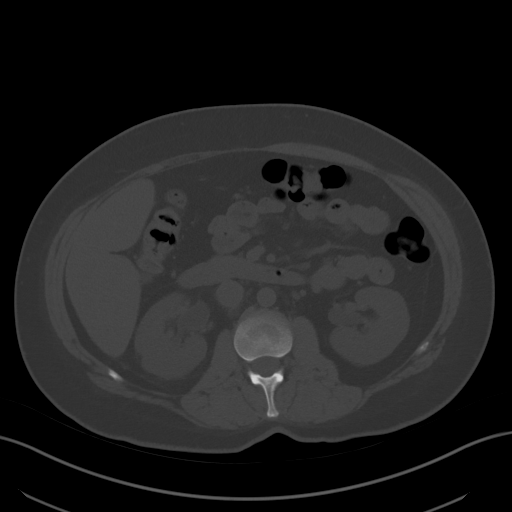
[im 74/103  soft-tissue]
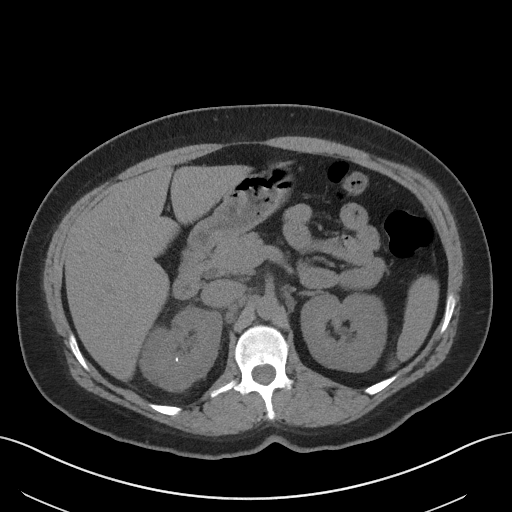
[im 82/103  soft-tissue]
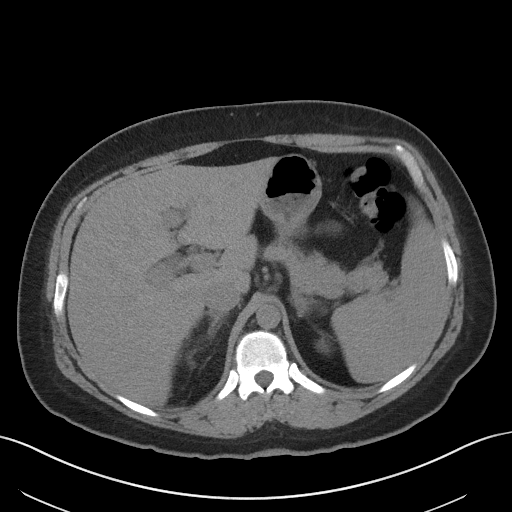
[im 90/103  soft-tissue]
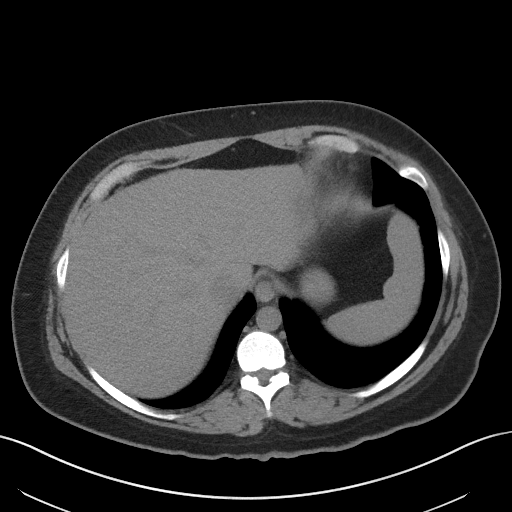
[im 98/103  soft-tissue]
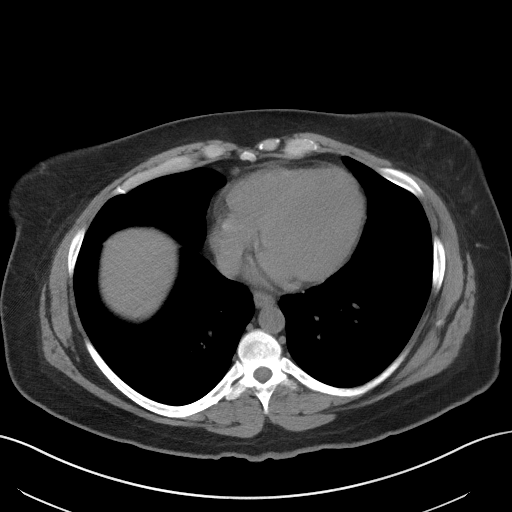

[Series 5: coronal · coronal · 0.75mm/px · 3 of 140 slices shown]
[im 47/140  soft-tissue]
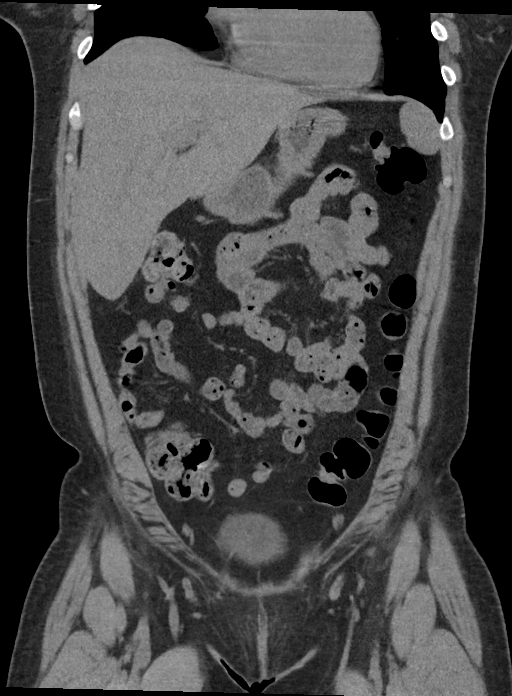
[im 62/140  soft-tissue]
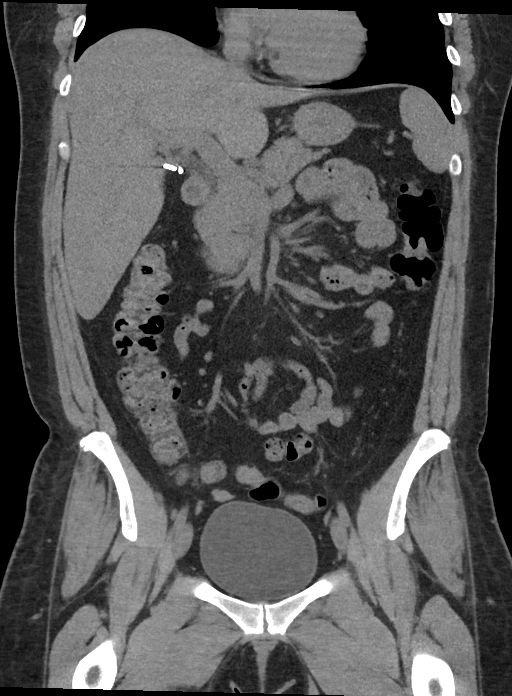
[im 78/140  soft-tissue]
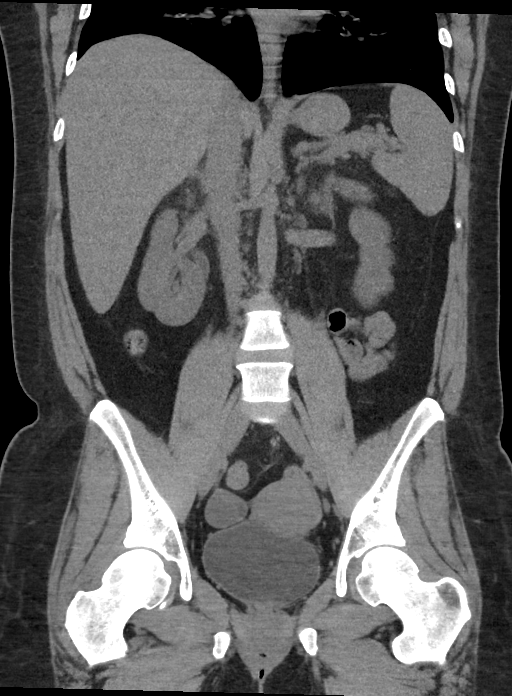

[16 of 46 positions shown; findings below may reference images not displayed]

FINDINGS: Lower chest:  No contributory findings.

Hepatobiliary: No focal liver abnormality.Cholecystectomy.

Pancreas: Unremarkable.

Spleen: Unremarkable.

Adrenals/Urinary Tract: Negative adrenals. Right perinephric and
periureteric edema. Patulous pelvis without hydronephrosis. No
ureteral stone or other obstructive process. There is a 3 mm right
upper and punctate left lower pole renal calculus. Negative bladder

Stomach/Bowel:  No obstruction. No appendicitis.

Vascular/Lymphatic: No acute vascular abnormality. No mass or
adenopathy.

Reproductive:Presumed dominant follicle on the right

Other: No ascites or pneumoperitoneum.

Musculoskeletal: No acute abnormalities.
IMPRESSION: 1. Right perinephric and periureteric stranding which could reflect
upper urinary tract infection or recently passed calculus. Please
correlate with urinalysis.
2. Small nonobstructive bilateral renal calculi.

## 2018-08-14 MED ORDER — ONDANSETRON HCL 4 MG/2ML IJ SOLN
4.0000 mg | Freq: Once | INTRAMUSCULAR | Status: AC
  Administered 2018-08-14: 4 mg via INTRAVENOUS
  Filled 2018-08-14: qty 2

## 2018-08-14 MED ORDER — KETOROLAC TROMETHAMINE 30 MG/ML IJ SOLN
30.0000 mg | Freq: Once | INTRAMUSCULAR | Status: AC
  Administered 2018-08-14: 30 mg via INTRAVENOUS
  Filled 2018-08-14: qty 1

## 2018-08-14 MED ORDER — CIPROFLOXACIN HCL 500 MG PO TABS: 500.0000 mg | ORAL_TABLET | Freq: Two times a day (BID) | ORAL | 0 refills | Status: AC

## 2018-08-14 MED ORDER — SODIUM CHLORIDE 0.9 % IV SOLN
1.0000 g | Freq: Once | INTRAVENOUS | Status: AC
  Administered 2018-08-14: 1 g via INTRAVENOUS
  Filled 2018-08-14: qty 10

## 2018-08-14 MED ORDER — CIPROFLOXACIN HCL 500 MG PO TABS: 500.0000 mg | ORAL_TABLET | Freq: Two times a day (BID) | ORAL | 0 refills | Status: DC

## 2018-08-14 MED ORDER — ONDANSETRON 4 MG PO TBDP: 4.0000 mg | ORAL_TABLET | Freq: Three times a day (TID) | ORAL | 0 refills | Status: DC | PRN

## 2018-08-14 MED ORDER — SODIUM CHLORIDE 0.9 % IV SOLN
1000.0000 mL | Freq: Once | INTRAVENOUS | Status: AC
  Administered 2018-08-14: 1000 mL via INTRAVENOUS

## 2018-08-14 NOTE — ED Provider Notes (Signed)
[facility] Emergency Department Provider Note   ____________________________________________    I have reviewed the triage vital signs and the nursing notes.   HISTORY  Chief Complaint Flank Pain     HPI Wendy Holmes is a 32 y.o. female with a history of bipolar disorder, kidney stones presents today with right flank pain.  Patient reports symptoms started over the last 2 days and have worsened.  She denies nausea or vomiting.  She denies dysuria or frequency.  History of kidney stones in the past, review of records demonstrates 3.5 mm stone several months ago on the left, that CT also demonstrated a punctate stone in the right kidney.  Patient describes sharp pain primarily in her right flank which does not radiate.   Past Medical History:  Diagnosis Date  . Bipolar 1 disorder (HCC)   . CIN III (cervical intraepithelial neoplasia III)     There are no active problems to display for this patient.   Past Surgical History:  Procedure Laterality Date  . CHOLECYSTECTOMY    . TONSILLECTOMY      Prior to Admission medications   Medication Sig Start Date End Date Taking? Authorizing Provider  ARIPiprazole (ABILIFY) 5 MG tablet Take 5 mg by mouth at bedtime.    [provider]  ciprofloxacin (CIPRO) 500 MG tablet Take 1 tablet (500 mg total) by mouth 2 (two) times daily for 10 days. 08/14/18 08/24/18  Jene Every, MD  docusate sodium (COLACE) 100 MG capsule Take 1 tablet once or twice daily as needed for constipation while taking narcotic pain medicine 06/02/18   Loleta Rose, MD  HYDROcodone-acetaminophen (NORCO/VICODIN) 5-325 MG tablet Take 1-2 tablets by mouth every 4 (four) hours as needed for moderate pain. 06/02/18   Loleta Rose, MD  ibuprofen (ADVIL,MOTRIN) 600 MG tablet Take 1 tablet (600 mg total) by mouth every 8 (eight) hours as needed. Patient not taking: Reported on 08/10/2017 06/09/16   Bridget Hartshorn L, PA-C  ondansetron  (ZOFRAN ODT) 4 MG disintegrating tablet Take 1 tablet (4 mg total) by mouth every 8 (eight) hours as needed for nausea or vomiting. 08/14/18   Jene Every, MD  sertraline (ZOLOFT) 50 MG tablet Take 50 mg by mouth at bedtime.    [provider]  tamsulosin (FLOMAX) 0.4 MG CAPS capsule Take 1 tablet by mouth daily until you pass the kidney stone or no longer have symptoms 06/02/18   Loleta Rose, MD     Allergies Patient has no known allergies.  No family history on file.  Social History Social History   Tobacco Use  . Smoking status: Never Smoker  . Smokeless tobacco: Never Used  Substance Use Topics  . Alcohol use: No  . Drug use: Not on file    Review of Systems  Constitutional: Denies chills Eyes: No visual changes.  ENT: No sore throat. Cardiovascular: Denies chest pain. Respiratory: Denies shortness of breath. Gastrointestinal: As above Genitourinary: Negative for dysuria.  No frequency Musculoskeletal: Flank pain as above Skin: Negative for rash. Neurological: Negative for headaches   ____________________________________________   PHYSICAL EXAM:  VITAL SIGNS: ED Triage Vitals  Enc Vitals Group     BP 08/14/18 0732 122/77     Pulse Rate 08/14/18 0732 98     Resp 08/14/18 0732 18     Temp 08/14/18 0732 99.8 F (37.7 C)     Temp Source 08/14/18 0732 Oral     SpO2 08/14/18 0732 100 %  Weight 08/14/18 0717 90.7 kg (200 lb)     Height 08/14/18 0717 1.626 m (5\' 4" )     Head Circumference --      Peak Flow --      Pain Score 08/14/18 0716 8     Pain Loc --      Pain Edu? --      Excl. in GC? --     Constitutional: Alert and oriented.  Eyes: Conjunctivae are normal.   Nose: No congestion/rhinnorhea. Mouth/Throat: Mucous membranes are moist.    Cardiovascular: Normal rate, regular rhythm. Grossly normal heart sounds.  Good peripheral circulation. Respiratory: Normal respiratory effort.  No retractions.  Gastrointestinal: Soft and nontender.  No distention.  No CVA tenderness.  No suprapubic tenderness  Neurologic:  Normal speech and language. No gross focal neurologic deficits are appreciated.  Skin:  Skin is warm, dry and intact. No rash noted. Psychiatric: Mood and affect are normal. Speech and behavior are normal.  ____________________________________________   LABS (all labs ordered are listed, but only abnormal results are displayed)  Labs Reviewed  URINALYSIS, COMPLETE (UACMP) WITH MICROSCOPIC - Abnormal; Notable for the following components:      Result Value   Color, Urine YELLOW (*)    APPearance HAZY (*)    Hgb urine dipstick MODERATE (*)    Leukocytes, UA SMALL (*)    All other components within normal limits  BASIC METABOLIC PANEL - Abnormal; Notable for the following components:   Potassium 2.9 (*)    Glucose, Bld 104 (*)    Calcium 8.4 (*)    All other components within normal limits  CBC - Abnormal; Notable for the following components:   WBC 13.5 (*)    All other components within normal limits  URINE CULTURE  POCT PREGNANCY, URINE  POC URINE PREG, ED   ____________________________________________  EKG  None ____________________________________________  RADIOLOGY  CT renal stone study, no stone, possibly recently passed stone versus pyelonephritis ____________________________________________   PROCEDURES  Procedure(s) performed: No  Procedures   Critical Care performed: No ____________________________________________   INITIAL IMPRESSION / ASSESSMENT AND PLAN / ED COURSE  Pertinent labs & imaging results that were available during my care of the patient were reviewed by me and considered in my medical decision making (see chart for details).  Patient presents with right-sided flank pain somewhat gradual onset, differential includes UTI/pyelonephritis, ureterolithiasis.  We will give IV Toradol if pregnancy test negative, IV fluids, IV Zofran check labs urine and  reevaluate.  CT scan does not demonstrate a stone, suspect pyelonephritis given elevated white blood cell count, borderline elevated temperature and relatively gradual onset.  Will give IV Rocephin, add on urine culture.  Patient reports she is feeling significant better after Toradol  Patient tolerating p.o.'s, will discharge with 10 days of antibiotics, return precautions discussed.    ____________________________________________   FINAL CLINICAL IMPRESSION(S) / ED DIAGNOSES  Final diagnoses:  Pyelonephritis        Note:  This document was prepared using Dragon voice recognition software and may include unintentional dictation errors.    Jene EveryKinner, Bobbie Valletta, MD 08/14/18 1154

## 2018-08-14 NOTE — ED Triage Notes (Signed)
Pt reports thinks she has a kidney stone on the right side. Pt states has had them before and it feels the same. Pt reports pain started Wednesday and has progressively gotten worse. Denies urinary issues.

## 2018-08-16 LAB — URINE CULTURE

## 2018-11-08 ENCOUNTER — Other Ambulatory Visit: Payer: Self-pay

## 2018-11-08 ENCOUNTER — Emergency Department
Admission: EM | Admit: 2018-11-08 | Discharge: 2018-11-08 | Disposition: A | Discharge status: Home / Self Care | Payer: Medicaid Other | Attending: Emergency Medicine | Admitting: Emergency Medicine

## 2018-11-08 ENCOUNTER — Encounter: Payer: Self-pay | Admitting: Emergency Medicine

## 2018-11-08 ENCOUNTER — Emergency Department: Payer: Medicaid Other

## 2018-11-08 DIAGNOSIS — Y939 Activity, unspecified: Secondary | ICD-10-CM | POA: Diagnosis not present

## 2018-11-08 DIAGNOSIS — S22089A Unspecified fracture of T11-T12 vertebra, initial encounter for closed fracture: Secondary | ICD-10-CM | POA: Diagnosis not present

## 2018-11-08 DIAGNOSIS — S32019A Unspecified fracture of first lumbar vertebra, initial encounter for closed fracture: Secondary | ICD-10-CM | POA: Insufficient documentation

## 2018-11-08 DIAGNOSIS — Z79899 Other long term (current) drug therapy: Secondary | ICD-10-CM | POA: Insufficient documentation

## 2018-11-08 DIAGNOSIS — Y998 Other external cause status: Secondary | ICD-10-CM | POA: Insufficient documentation

## 2018-11-08 DIAGNOSIS — S3992XA Unspecified injury of lower back, initial encounter: Secondary | ICD-10-CM | POA: Diagnosis present

## 2018-11-08 DIAGNOSIS — Y92410 Unspecified street and highway as the place of occurrence of the external cause: Secondary | ICD-10-CM | POA: Insufficient documentation

## 2018-11-08 LAB — URINALYSIS, COMPLETE (UACMP) WITH MICROSCOPIC
Bilirubin Urine: NEGATIVE
Glucose, UA: NEGATIVE mg/dL
Hgb urine dipstick: NEGATIVE
Ketones, ur: NEGATIVE mg/dL
Leukocytes, UA: NEGATIVE
Nitrite: NEGATIVE
Protein, ur: NEGATIVE mg/dL
Specific Gravity, Urine: 1.011 (ref 1.005–1.030)
pH: 8 (ref 5.0–8.0)

## 2018-11-08 LAB — POCT PREGNANCY, URINE: Preg Test, Ur: NEGATIVE
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
## 2018-11-08 MED ORDER — OXYCODONE-ACETAMINOPHEN 5-325 MG PO TABS: 1.0000 | ORAL_TABLET | Freq: Three times a day (TID) | ORAL | 0 refills | Status: AC | PRN

## 2018-11-08 MED ORDER — MELOXICAM 15 MG PO TABS: 15.0000 mg | ORAL_TABLET | Freq: Every day | ORAL | 1 refills | Status: AC

## 2018-11-08 MED ORDER — OXYCODONE-ACETAMINOPHEN 5-325 MG PO TABS
1.0000 | ORAL_TABLET | Freq: Once | ORAL | Status: AC
  Administered 2018-11-08: 1 via ORAL
  Filled 2018-11-08: qty 1

## 2018-11-08 NOTE — ED Notes (Signed)
Pt in xray

## 2018-11-08 NOTE — ED Notes (Signed)
Report to tony, rn
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
## 2019-05-29 ENCOUNTER — Other Ambulatory Visit: Payer: Self-pay

## 2019-05-29 ENCOUNTER — Emergency Department
Admission: EM | Admit: 2019-05-29 | Discharge: 2019-05-29 | Disposition: A | Discharge status: Home / Self Care | Payer: Medicaid Other | Attending: Emergency Medicine | Admitting: Emergency Medicine

## 2019-05-29 DIAGNOSIS — X500XXA Overexertion from strenuous movement or load, initial encounter: Secondary | ICD-10-CM | POA: Diagnosis not present

## 2019-05-29 DIAGNOSIS — Y9389 Activity, other specified: Secondary | ICD-10-CM | POA: Insufficient documentation

## 2019-05-29 DIAGNOSIS — Z79899 Other long term (current) drug therapy: Secondary | ICD-10-CM | POA: Insufficient documentation

## 2019-05-29 DIAGNOSIS — Y929 Unspecified place or not applicable: Secondary | ICD-10-CM | POA: Insufficient documentation

## 2019-05-29 DIAGNOSIS — Y999 Unspecified external cause status: Secondary | ICD-10-CM | POA: Insufficient documentation

## 2019-05-29 DIAGNOSIS — S56912A Strain of unspecified muscles, fascia and tendons at forearm level, left arm, initial encounter: Secondary | ICD-10-CM

## 2019-05-29 DIAGNOSIS — S59812A Other specified injuries left forearm, initial encounter: Secondary | ICD-10-CM | POA: Diagnosis present

## 2019-05-29 MED ORDER — HYDROCODONE-ACETAMINOPHEN 5-325 MG PO TABS
1.0000 | ORAL_TABLET | Freq: Once | ORAL | Status: AC
  Administered 2019-05-29: 1 via ORAL
  Filled 2019-05-29: qty 1

## 2019-05-29 MED ORDER — CYCLOBENZAPRINE HCL 10 MG PO TABS: 10.0000 mg | ORAL_TABLET | Freq: Three times a day (TID) | ORAL | 0 refills | Status: DC | PRN

## 2019-05-29 MED ORDER — HYDROCODONE-ACETAMINOPHEN 5-325 MG PO TABS: 1.0000 | ORAL_TABLET | ORAL | 0 refills | Status: DC | PRN

## 2019-05-29 MED ORDER — NAPROXEN 500 MG PO TABS: 500.0000 mg | ORAL_TABLET | Freq: Two times a day (BID) | ORAL | 0 refills | Status: DC

## 2019-05-29 NOTE — ED Provider Notes (Signed)
[facility] Emergency Department Provider Note ____________________________________________  Time seen: Approximately 8:38 AM  I have reviewed the triage vital signs and the nursing notes.   HISTORY  Chief Complaint Arm Pain    HPI Wendy Holmes is a 33 y.o. female who presents to the emergency department for evaluation and treatment of left forearm pain.  Patient states that she awakened yesterday and her arm felt sore but she thought she just slept wrong.  She states that throughout the day yesterday she was busy pulling up carpet from her bedroom and then used a push power to cut her grass.  She states that yesterday evening the pain in the left forearm got much worse.  No relief with Tylenol and ice.  No specific injury. Past Medical History:  Diagnosis Date  . Anxiety   . Bipolar 1 disorder (HCC)   . CIN III (cervical intraepithelial neoplasia III)   . Depression   . History of LEEP (loop electrosurgical excision procedure) of cervix complicating pregnancy, second trimester    2012    There are no active problems to display for this patient.   Past Surgical History:  Procedure Laterality Date  . CHOLECYSTECTOMY    . TONSILLECTOMY    . TYMPANOSTOMY TUBE PLACEMENT     childhood  . URETERAL STENT PLACEMENT      Prior to Admission medications   Medication Sig Start Date End Date Taking? Authorizing Provider  ARIPiprazole (ABILIFY) 5 MG tablet Take 5 mg by mouth at bedtime.    [provider]  cyclobenzaprine (FLEXERIL) 10 MG tablet Take 1 tablet (10 mg total) by mouth 3 (three) times daily as needed. 05/29/19   Kem Boroughsriplett, Rayleen Wyrick B, FNP  docusate sodium (COLACE) 100 MG capsule Take 1 tablet once or twice daily as needed for constipation while taking narcotic pain medicine 06/02/18   Loleta RoseForbach, Cory, MD  HYDROcodone-acetaminophen (NORCO/VICODIN) 5-325 MG tablet Take 1 tablet by mouth every 4 (four) hours as needed for moderate pain. 05/29/19 05/28/20   Torence Palmeri, Rulon Eisenmengerari B, FNP  naproxen (NAPROSYN) 500 MG tablet Take 1 tablet (500 mg total) by mouth 2 (two) times daily with a meal. 05/29/19   Jevon Shells B, FNP  ondansetron (ZOFRAN ODT) 4 MG disintegrating tablet Take 1 tablet (4 mg total) by mouth every 8 (eight) hours as needed for nausea or vomiting. 08/14/18   Jene EveryKinner, Robert, MD  sertraline (ZOLOFT) 50 MG tablet Take 50 mg by mouth at bedtime.    [provider]  tamsulosin (FLOMAX) 0.4 MG CAPS capsule Take 1 tablet by mouth daily until you pass the kidney stone or no longer have symptoms 06/02/18   Loleta RoseForbach, Cory, MD    Allergies Patient has no known allergies.  No family history on file.  Social History Social History   Tobacco Use  . Smoking status: Never Smoker  . Smokeless tobacco: Never Used  Substance Use Topics  . Alcohol use: No  . Drug use: Not on file    Review of Systems Constitutional: Negative for fever. Cardiovascular: Negative for chest pain. Respiratory: Negative for shortness of breath. Musculoskeletal: Positive for left forearm pain. Skin: Negative for open wounds or lesions Neurological: Negative for decrease in sensation  ____________________________________________   PHYSICAL EXAM:  VITAL SIGNS: ED Triage Vitals  Enc Vitals Group     BP 05/29/19 0832 115/69     Pulse Rate 05/29/19 0832 96     Resp 05/29/19 0830 17     Temp 05/29/19  0832 99.1 F (37.3 C)     Temp Source 05/29/19 0830 Oral     SpO2 05/29/19 0832 98 %     Weight 05/29/19 0831 200 lb (90.7 kg)     Height 05/29/19 0831 5\' 4"  (1.626 m)     Head Circumference --      Peak Flow --      Pain Score 05/29/19 0830 8     Pain Loc --      Pain Edu? --      Excl. in [place]? --     Constitutional: Alert and oriented. Well appearing and in no acute distress. Eyes: Conjunctivae are clear without discharge or drainage Head: Atraumatic Neck: Supple Respiratory: No cough. Respirations are even and unlabored. Musculoskeletal: Mild  swelling is noted over the brachioradialis muscle on the flexor surface of the left forearm.  Pain increases with pronation and supination of the wrist.  No pain with flexion or extension of the elbow.  No pain with range of motion in the shoulder. Neurologic: Motor and sensory function of the left upper extremity is intact. Skin: No contusions, abrasions, other open wounds or lesions is noted in the left forearm. Psychiatric: Affect and behavior are appropriate.  ____________________________________________   LABS (all labs ordered are listed, but only abnormal results are displayed)  Labs Reviewed - No data to display ____________________________________________  RADIOLOGY  None ____________________________________________   PROCEDURES  Procedures  ____________________________________________   INITIAL IMPRESSION / ASSESSMENT AND PLAN / ED COURSE  Wendy Holmes is a 33 y.o. who presents to the emergency department for treatment and evaluation of left forearm pain.  No bony tenderness elicited on exam.  Muscle strain is consistent with patient's most recent activities.  She will be treated with medications as listed below.  She does have a primary care provider and was encouraged to follow-up with [facility] clinic if not improving over the week.  She was placed in a sling for comfort but advised not to wear it consistently.   Medications  HYDROcodone-acetaminophen (NORCO/VICODIN) 5-325 MG per tablet 1 tablet (1 tablet Oral Given 05/29/19 1517)    Pertinent labs & imaging results that were available during my care of the patient were reviewed by me and considered in my medical decision making (see chart for details).  _________________________________________   FINAL CLINICAL IMPRESSION(S) / ED DIAGNOSES  Final diagnoses:  Muscle strain of left forearm, initial encounter    ED Discharge Orders         Ordered    cyclobenzaprine (FLEXERIL) 10 MG tablet  3 times daily PRN      05/29/19 0847    naproxen (NAPROSYN) 500 MG tablet  2 times daily with meals     05/29/19 0847    HYDROcodone-acetaminophen (NORCO/VICODIN) 5-325 MG tablet  Every 4 hours PRN     05/29/19 0847           If controlled substance prescribed during this visit, 12 month history viewed on the [place] prior to issuing an initial prescription for Schedule II or III opiod.   Victorino Dike, FNP 05/29/19 6160    Schuyler Amor, MD 05/29/19 [phone]

## 2019-05-29 NOTE — ED Triage Notes (Signed)
Pt c/o left FA pain, states she mowed and pulled up carpet yesterday.

## 2019-05-29 NOTE — Discharge Instructions (Signed)
Rest, ice 20 minutes per hour, and take your medications as prescribed.  See primary care if not improving over the week.  Return to the ER for symptoms that change or worsen if unable to schedule an appointment.

## 2019-09-17 ENCOUNTER — Ambulatory Visit: Payer: Self-pay

## 2019-10-01 ENCOUNTER — Ambulatory Visit: Payer: Medicaid Other

## 2019-10-01 ENCOUNTER — Other Ambulatory Visit: Payer: Self-pay

## 2019-10-01 DIAGNOSIS — Z20822 Contact with and (suspected) exposure to covid-19: Secondary | ICD-10-CM

## 2019-10-02 LAB — NOVEL CORONAVIRUS, NAA: SARS-CoV-2, NAA: NOT DETECTED

## 2019-12-14 ENCOUNTER — Ambulatory Visit: Payer: Medicaid Other

## 2020-04-21 ENCOUNTER — Other Ambulatory Visit: Payer: Self-pay

## 2020-04-21 ENCOUNTER — Ambulatory Visit: Payer: Medicaid Other | Admitting: Family Medicine

## 2020-04-21 ENCOUNTER — Encounter: Payer: Self-pay | Admitting: Family Medicine

## 2020-04-21 DIAGNOSIS — Z202 Contact with and (suspected) exposure to infections with a predominantly sexual mode of transmission: Secondary | ICD-10-CM

## 2020-04-21 DIAGNOSIS — Z113 Encounter for screening for infections with a predominantly sexual mode of transmission: Secondary | ICD-10-CM

## 2020-04-21 DIAGNOSIS — A599 Trichomoniasis, unspecified: Secondary | ICD-10-CM

## 2020-04-21 DIAGNOSIS — F3181 Bipolar II disorder: Secondary | ICD-10-CM | POA: Insufficient documentation

## 2020-04-21 DIAGNOSIS — F319 Bipolar disorder, unspecified: Secondary | ICD-10-CM | POA: Insufficient documentation

## 2020-04-21 LAB — WET PREP FOR TRICH, YEAST, CLUE
Trichomonas Exam: POSITIVE — AB
Yeast Exam: NEGATIVE

## 2020-04-21 MED ORDER — CEFTRIAXONE SODIUM 500 MG IJ SOLR
500.0000 mg | Freq: Once | INTRAMUSCULAR | Status: AC
  Administered 2020-04-21: 500 mg via INTRAMUSCULAR

## 2020-04-21 MED ORDER — METRONIDAZOLE 500 MG PO TABS: 2000.0000 mg | ORAL_TABLET | Freq: Once | ORAL | 0 refills | Status: AC

## 2020-04-21 MED ORDER — DOXYCYCLINE HYCLATE 100 MG PO TABS: 100.0000 mg | ORAL_TABLET | Freq: Two times a day (BID) | ORAL | 0 refills | Status: AC

## 2020-04-21 NOTE — Progress Notes (Signed)
[facility] Department STI clinic/screening visit  Subjective:  Wendy Holmes is a 34 y.o. female being seen today for  Chief Complaint  Patient presents with  . SEXUALLY TRANSMITTED DISEASE     The patient reports they do have symptoms. Patient reports that they do not desire a pregnancy in the next year. They reported they are not interested in discussing contraception today.   Patient has the following medical conditions:   Patient Active Problem List   Diagnosis Date Noted  . Manic depression ([place]) 04/21/2020  . Bipolar 2 disorder ([place]) 04/21/2020    HPI  Pt reports she is a contact to gonorrhea and chlamydia. She has had left side abd pain and vaginal discharge x1 wk.   She has nexplanon, placed ~3 yrs ago. Has had heartburn, irreg sleep, urinary frequency. Wondering if she is pregnant.  See flowsheet for further details and programmatic requirements.    Patient's last menstrual period was 04/07/2020 (exact date). Last sex: 5 days ago  BCM: nexplanon Desires EC? n/a   No components found for: HCV  The following portions of the patient's history were reviewed and updated as appropriate: allergies, current medications, past medical history, past social history, past surgical history and problem list.  Objective:  There were no vitals filed for this visit.   Physical Exam Vitals and nursing note reviewed.  Constitutional:      Appearance: Normal appearance.  HENT:     Head: Normocephalic and atraumatic.     Mouth/Throat:     Mouth: Mucous membranes are moist.     Pharynx: Oropharynx is clear. No oropharyngeal exudate or posterior oropharyngeal erythema.  Pulmonary:     Effort: Pulmonary effort is normal.  Abdominal:     General: Abdomen is flat.     Palpations: There is no mass.     Tenderness: There is no abdominal tenderness. There is no rebound.  Genitourinary:    General: Normal vulva.     Exam position: Lithotomy position.     Pubic Area:  No rash or pubic lice.      Labia:        Right: No rash or lesion.        Left: No rash or lesion.      Vagina: Vaginal discharge (scant, white, ph>4.5) present. No erythema, bleeding or lesions.     Cervix: No cervical motion tenderness, discharge, friability, lesion or erythema.     Uterus: Normal.      Adnexa: Right adnexa normal and left adnexa normal.     Rectum: Normal.  Lymphadenopathy:     Head:     Right side of head: No preauricular or posterior auricular adenopathy.     Left side of head: No preauricular or posterior auricular adenopathy.     Cervical: No cervical adenopathy.     Upper Body:     Right upper body: No supraclavicular or axillary adenopathy.     Left upper body: No supraclavicular or axillary adenopathy.     Lower Body: No right inguinal adenopathy. No left inguinal adenopathy.  Skin:    General: Skin is warm and dry.     Findings: No rash.  Neurological:     Mental Status: She is alert and oriented to person, place, and time.      Assessment and Plan:  JILLIENNE EGNER is a 34 y.o. female presenting to the [facility] Department for STI screening   1. Screening examination for venereal disease -  Pt with symptoms. Screenings today as below. Treat wet prep per standing order. -Patient does nto meet criteria for HepB, HepC Screening. Declines HIV and syphilis screenings. -Counseled on warning s/sx and when to seek care. Recommended condom use with all sex and discussed importance of condom use for STI prevention. -Per clinic policy pt was offered to pay for a pregnancy test here in clinic, she declines today.  - WET PREP FOR TRICH, YEAST, CLUE - Chlamydia/Gonorrhea [facility]  2. Gonorrhea contact 3. Chlamydia contact -Treatment today as below. Pt weight <300 lbs. -Pt counseled regarding medication. No known allergies to these medications.  -Advised no sex for 7 days after both pt and partner completes treatment and encouraged condoms with  all sex. - doxycycline (VIBRA-TABS) 100 MG tablet; Take 1 tablet (100 mg total) by mouth 2 (two) times daily for 7 days.  Dispense: 14 tablet; Refill: 0 - cefTRIAXone (ROCEPHIN) injection 500 mg  4. Trichimoniasis -Wet prep + for trich, pt treated per standing order by RN as below. See RN documentation. - metroNIDAZOLE (FLAGYL) 500 MG tablet; Take 4 tablets (2,000 mg total) by mouth once for 1 dose.  Dispense: 4 tablet; Refill: 0  Return if symptoms worsen or fail to improve.  No future appointments.  Ann Held, PA-C

## 2020-04-21 NOTE — Progress Notes (Signed)
[employer] results reviewed. Patient treated per standing orders for Trich and as a contact to GC and Chlamydia. Tawny Hopping, RN

## 2020-04-21 NOTE — Progress Notes (Signed)
Here today for STD screening. States she is a contact to [facility] and Chlamydia. Declines bloodwork. Tawny Hopping, RN

## 2020-05-05 ENCOUNTER — Other Ambulatory Visit: Payer: Self-pay

## 2020-05-05 ENCOUNTER — Emergency Department: Payer: Medicaid Other

## 2020-05-05 ENCOUNTER — Emergency Department
Admission: EM | Admit: 2020-05-05 | Discharge: 2020-05-05 | Disposition: A | Discharge status: Home / Self Care | Payer: Medicaid Other | Attending: Student in an Organized Health Care Education/Training Program | Admitting: Student in an Organized Health Care Education/Training Program

## 2020-05-05 DIAGNOSIS — Z79899 Other long term (current) drug therapy: Secondary | ICD-10-CM | POA: Diagnosis not present

## 2020-05-05 DIAGNOSIS — F17210 Nicotine dependence, cigarettes, uncomplicated: Secondary | ICD-10-CM | POA: Diagnosis not present

## 2020-05-05 DIAGNOSIS — M791 Myalgia, unspecified site: Secondary | ICD-10-CM | POA: Insufficient documentation

## 2020-05-05 DIAGNOSIS — J45901 Unspecified asthma with (acute) exacerbation: Secondary | ICD-10-CM | POA: Insufficient documentation

## 2020-05-05 DIAGNOSIS — R509 Fever, unspecified: Secondary | ICD-10-CM | POA: Insufficient documentation

## 2020-05-05 DIAGNOSIS — Z20822 Contact with and (suspected) exposure to covid-19: Secondary | ICD-10-CM | POA: Diagnosis not present

## 2020-05-05 DIAGNOSIS — R079 Chest pain, unspecified: Secondary | ICD-10-CM | POA: Diagnosis present

## 2020-05-05 LAB — TROPONIN I (HIGH SENSITIVITY): Troponin I (High Sensitivity): 4 ng/L (ref <18)

## 2020-05-05 LAB — URINALYSIS, COMPLETE (UACMP) WITH MICROSCOPIC
Bacteria, UA: NONE SEEN
Bilirubin Urine: NEGATIVE
Glucose, UA: NEGATIVE mg/dL
Hgb urine dipstick: NEGATIVE
Ketones, ur: NEGATIVE mg/dL
Leukocytes,Ua: NEGATIVE
Nitrite: NEGATIVE
Protein, ur: NEGATIVE mg/dL
Specific Gravity, Urine: 1.008 (ref 1.005–1.030)
pH: 6 (ref 5.0–8.0)

## 2020-05-05 LAB — CBC
HCT: 41.2 % (ref 36.0–46.0)
Hemoglobin: 13.9 g/dL (ref 12.0–15.0)
MCH: 30.2 pg (ref 26.0–34.0)
MCHC: 33.7 g/dL (ref 30.0–36.0)
MCV: 89.6 fL (ref 80.0–100.0)
Platelets: 354 10*3/uL (ref 150–400)
RBC: 4.6 MIL/uL (ref 3.87–5.11)
RDW: 13.2 % (ref 11.5–15.5)
WBC: 13.7 10*3/uL — ABNORMAL HIGH (ref 4.0–10.5)
nRBC: 0 % (ref 0.0–0.2)

## 2020-05-05 LAB — SARS CORONAVIRUS 2 BY RT PCR (HOSPITAL ORDER, PERFORMED IN ~~LOC~~ HOSPITAL LAB): SARS Coronavirus 2: NEGATIVE

## 2020-05-05 LAB — BASIC METABOLIC PANEL
Anion gap: 9 (ref 5–15)
BUN: 7 mg/dL (ref 6–20)
CO2: 25 mmol/L (ref 22–32)
Calcium: 8.9 mg/dL (ref 8.9–10.3)
Chloride: 104 mmol/L (ref 98–111)
Creatinine, Ser: 0.75 mg/dL (ref 0.44–1.00)
GFR calc Af Amer: >60 mL/min (ref >60)
GFR calc non Af Amer: >60 mL/min (ref >60)
Glucose, Bld: 109 mg/dL — ABNORMAL HIGH (ref 70–99)
Potassium: 3.1 mmol/L — ABNORMAL LOW (ref 3.5–5.1)
Sodium: 138 mmol/L (ref 135–145)

## 2020-05-05 LAB — INFLUENZA PANEL BY PCR (TYPE A & B)
Influenza A By PCR: NEGATIVE
Influenza B By PCR: NEGATIVE

## 2020-05-05 IMAGING — CR DG CHEST 2V
1 series · 2 of 2 positions shown · non-contrast
Comparison: None.

CLINICAL DATA: Chest pain.

EXAM:
CHEST - 2 VIEW

[Series 1: dg chest 2 view · 0.14mm/px · 2 of 2 slices shown]
[im 1/2]
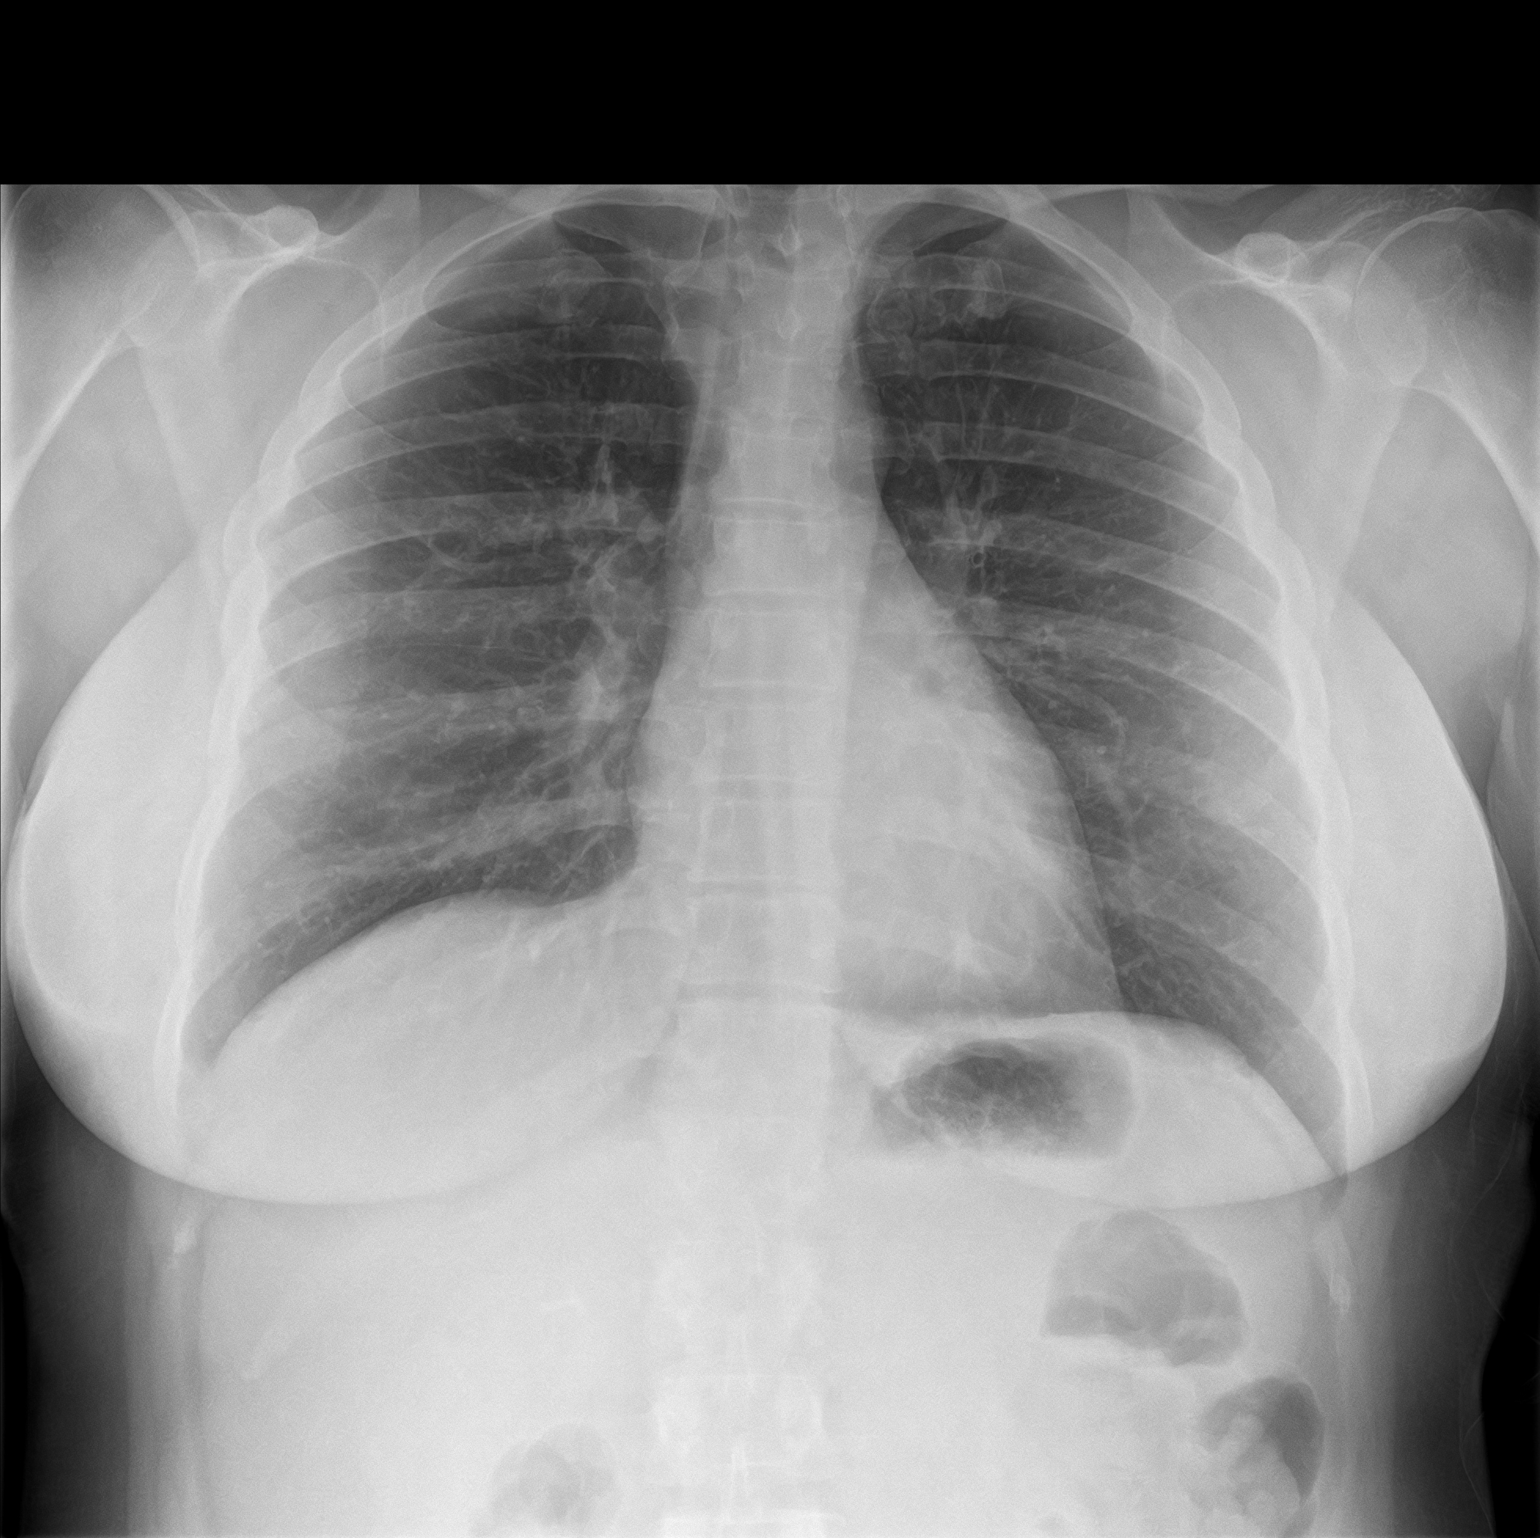
[im 2/2]
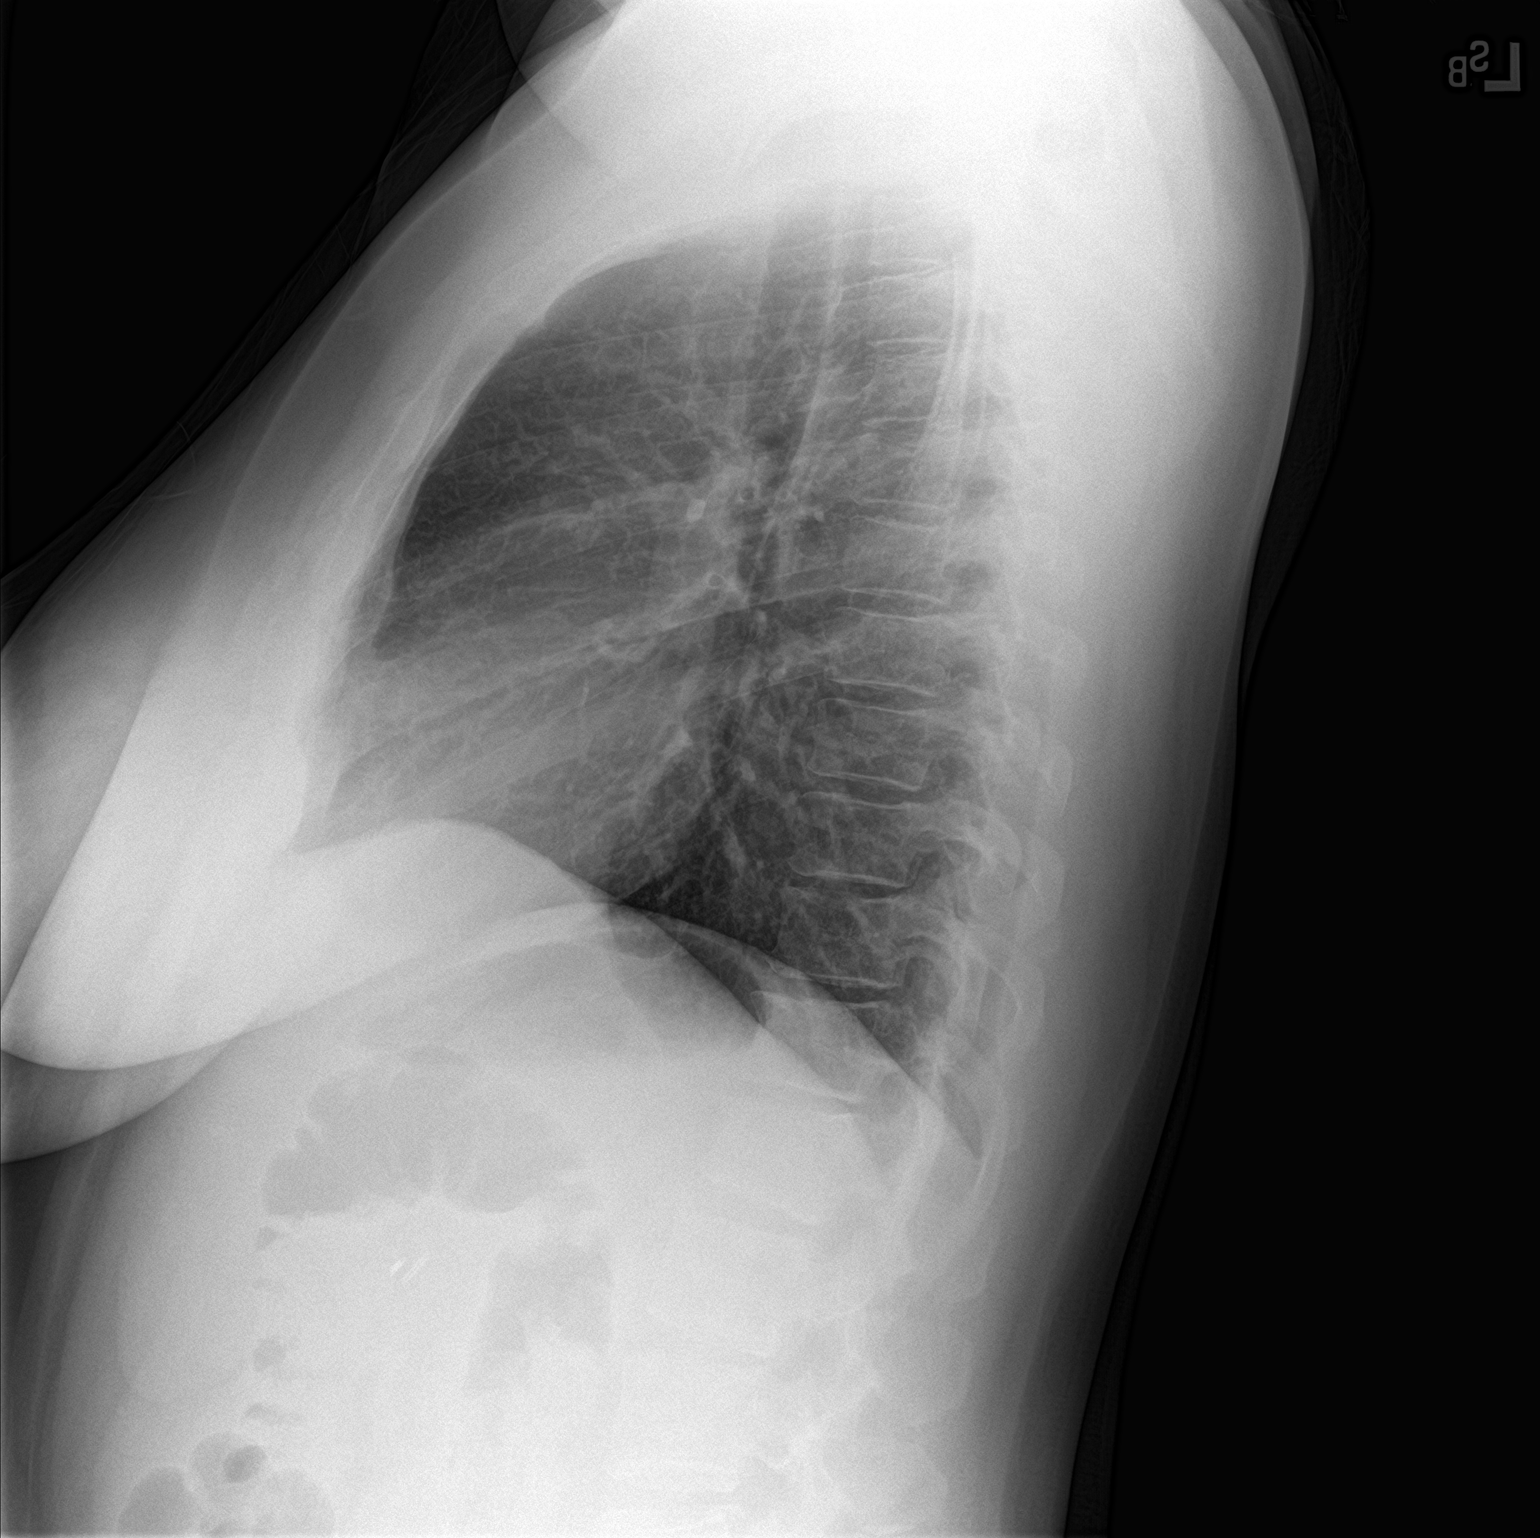

[2 of 2 positions shown; findings below may reference images not displayed]

FINDINGS: The heart size and mediastinal contours are within normal limits.
Both lungs are clear. No pneumothorax or pleural effusion is noted.
The visualized skeletal structures are unremarkable.
IMPRESSION: No active cardiopulmonary disease.

## 2020-05-05 MED ORDER — ALBUTEROL SULFATE HFA 108 (90 BASE) MCG/ACT IN AERS
2.0000 | INHALATION_SPRAY | Freq: Four times a day (QID) | RESPIRATORY_TRACT | 1 refills | Status: DC | PRN
Start: 2020-05-05 — End: 2020-05-07

## 2020-05-05 MED ORDER — ACETAMINOPHEN 500 MG PO TABS
1000.0000 mg | ORAL_TABLET | Freq: Once | ORAL | Status: AC
  Administered 2020-05-05: 1000 mg via ORAL
  Filled 2020-05-05: qty 2

## 2020-05-05 MED ORDER — PREDNISONE 20 MG PO TABS
60.0000 mg | ORAL_TABLET | Freq: Once | ORAL | Status: AC
  Administered 2020-05-05: 60 mg via ORAL
  Filled 2020-05-05: qty 3

## 2020-05-05 MED ORDER — ALBUTEROL SULFATE (2.5 MG/3ML) 0.083% IN NEBU
5.0000 mg | INHALATION_SOLUTION | Freq: Once | RESPIRATORY_TRACT | Status: AC
  Administered 2020-05-05: 5 mg via RESPIRATORY_TRACT
  Filled 2020-05-05: qty 6

## 2020-05-05 MED ORDER — PREDNISONE 10 MG PO TABS: ORAL_TABLET | ORAL | 0 refills | Status: DC

## 2020-05-05 MED ORDER — IPRATROPIUM-ALBUTEROL 0.5-2.5 (3) MG/3ML IN SOLN
3.0000 mL | Freq: Once | RESPIRATORY_TRACT | Status: AC
  Administered 2020-05-05: 3 mL via RESPIRATORY_TRACT
  Filled 2020-05-05: qty 3

## 2020-05-05 NOTE — ED Triage Notes (Signed)
Reports chest pain, body aches, fever and ND last week. Negative COVID test X 2 in last week. Dry cough.

## 2020-05-05 NOTE — Discharge Instructions (Signed)
Follow-up with your primary care provider if any continued problems or concerns.  Also for management of your asthma.  Decrease smoking if at all possible.  A prescription for an inhaler was sent to your pharmacy along with the prednisone.  You had the first dose of prednisone while in the ED and would not need to start the pills till tomorrow.  Try to take these pills the same time each day if at all possible.

## 2020-05-05 NOTE — ED Notes (Signed)
See triage note Presents with body aches h/a and fever last week  Pt is afebrile on arrival   Has had 2 negative COVID tests recently

## 2020-05-05 NOTE — ED Provider Notes (Signed)
[facility] Emergency Department Provider Note  ____________________________________________   First MD Initiated Contact with Patient 05/05/20 1425     (approximate)  I have reviewed the triage vital signs and the nursing notes.   HISTORY  Chief Complaint Generalized Body Aches, Fever, and Chest Pain   HPI Wendy Holmes is a 34 y.o. female presents to the ED with complaint of body aches, headache, fever that began last evening.  Patient states that she has had 2 - Covid test at [facility] recently.  Patient had a history of asthma 10 years ago.  Patient continues to smoke daily.  She is unaware of any exposure to Covid.  Denies any loss of taste or smell.  There is been no vomiting or diarrhea.       Past Medical History:  Diagnosis Date  . Anxiety   . Bipolar 1 disorder ([place])   . CIN III (cervical intraepithelial neoplasia III)   . Depression   . History of LEEP (loop electrosurgical excision procedure) of cervix complicating pregnancy, second trimester    2012    Patient Active Problem List   Diagnosis Date Noted  . Manic depression ([place]) 04/21/2020  . Bipolar 2 disorder ([place]) 04/21/2020    Past Surgical History:  Procedure Laterality Date  . CHOLECYSTECTOMY    . TONSILLECTOMY    . TYMPANOSTOMY TUBE PLACEMENT     childhood  . URETERAL STENT PLACEMENT      Prior to Admission medications   Medication Sig Start Date End Date Taking? Authorizing Provider  etonogestrel (NEXPLANON) 68 MG IMPL implant 1 each by Subdermal route once.    [provider]  Lurasidone HCl 60 MG TABS Take by mouth.    [provider]    Allergies Patient has no known allergies.  No family history on file.  Social History Social History   Tobacco Use  . Smoking status: Current Every Day Smoker    Types: Cigarettes  . Smokeless tobacco: Never Used  Substance Use Topics  . Alcohol use: Yes    Comment: 2x/year  . Drug use: Never    Review  of Systems Constitutional: No fever/chills Cardiovascular: Denies chest pain. Respiratory: Denies shortness of breath. Gastrointestinal: No abdominal pain.  No nausea, no vomiting.   Genitourinary: Negative for dysuria. Musculoskeletal: Positive for body aches. Skin: Negative for rash. Neurological: Negative for headaches, focal weakness or numbness. ___________________________________________   PHYSICAL EXAM:  VITAL SIGNS: ED Triage Vitals [05/05/20 1334]  Enc Vitals Group     BP (!) 120/97     Pulse Rate (!) 114     Resp (!) 22     Temp 98.6 F (37 C)     Temp Source Oral     SpO2 99 %     Weight 200 lb (90.7 kg)     Height 5\' 4"  (1.626 m)     Head Circumference      Peak Flow      Pain Score 4     Pain Loc      Pain Edu?      Excl. in [place]?     Constitutional: Alert and oriented. Well appearing and in no acute distress. Eyes: Conjunctivae are normal. PERRL. EOMI. Head: Atraumatic. Nose: No congestion/rhinnorhea. Mouth/Throat: Mucous membranes are moist.  Oropharynx non-erythematous. Neck: No stridor.   Cardiovascular: Normal rate, regular rhythm. Grossly normal heart sounds.  Good peripheral circulation. Respiratory: Normal respiratory effort.  No retractions. Lungs bilateral expiratory wheeze heard  throughout. Gastrointestinal: Soft and nontender. No distention.  Musculoskeletal: Moves upper and lower extremities without difficulty.  Normal gait was noted. Neurologic:  Normal speech and language. No gross focal neurologic deficits are appreciated. No gait instability. Skin:  Skin is warm, dry and intact. No rash noted. Psychiatric: Mood and affect are normal. Speech and behavior are normal.  ____________________________________________   LABS (all labs ordered are listed, but only abnormal results are displayed)  Labs Reviewed  BASIC METABOLIC PANEL - Abnormal; Notable for the following components:      Result Value   Potassium 3.1 (*)    Glucose, Bld 109  (*)    All other components within normal limits  CBC - Abnormal; Notable for the following components:   WBC 13.7 (*)    All other components within normal limits  SARS CORONAVIRUS 2 BY RT PCR (HOSPITAL ORDER, PERFORMED IN [facility])  URINALYSIS, COMPLETE (UACMP) WITH MICROSCOPIC  INFLUENZA PANEL BY PCR (TYPE A & B)  POC URINE PREG, ED  TROPONIN I (HIGH SENSITIVITY)   ____________________________________________  EKG  Sinus tachycardia with ventricular rate of 112. ____________________________________________  RADIOLOGY   Official radiology report(s): DG Chest 2 View  Result Date: 05/05/2020 CLINICAL DATA:  Chest pain. EXAM: CHEST - 2 VIEW COMPARISON:  None. FINDINGS: The heart size and mediastinal contours are within normal limits. Both lungs are clear. No pneumothorax or pleural effusion is noted. The visualized skeletal structures are unremarkable. IMPRESSION: No active cardiopulmonary disease. Electronically Signed   By: Lupita Raider M.D.   On: 05/05/2020 14:55    ____________________________________________   PROCEDURES  Procedure(s) performed (including Critical Care):  Procedures   ____________________________________________   INITIAL IMPRESSION / ASSESSMENT AND PLAN / ED COURSE  As part of my medical decision making, I reviewed the following data within the electronic MEDICAL RECORD NUMBER Notes from prior ED visits and [place] Controlled Substance Database  Wendy Holmes was evaluated in Emergency Department on 05/05/2020 for the symptoms described in the history of present illness. She was evaluated in the context of the global COVID-19 pandemic, which necessitated consideration that the patient might be at risk for infection with the SARS-CoV-2 virus that causes COVID-19. Institutional protocols and algorithms that pertain to the evaluation of patients at risk for COVID-19 are in a state of rapid change based on information released by regulatory  bodies including the CDC and federal and state organizations. These policies and algorithms were followed during the patient's care in the ED.  ----------------------------------------- 4:45 PM on 05/05/2020 ----------------------------------------- After nebulizer treatment patient no longer is wheezing and states that the cough is improved and she is breathing much easier.  At the time of discharge her Covid test had not resulted but she will be called if it is positive and she is aware of that.  No expiratory wheezes were heard throughout and patient's cough had decreased considerably.  After the improvement I believe this is more of a asthma attack then anything viral.  A prescription for an inhaler and continued prednisone was sent to her pharmacy.  ____________________________________________   FINAL CLINICAL IMPRESSION(S) / ED DIAGNOSES  Final diagnoses:  Mild asthma with exacerbation, unspecified whether persistent     ED Discharge Orders    None       Note:  This document was prepared using Dragon voice recognition software and may include unintentional dictation errors.    Tommi Rumps, PA-C 05/05/20 1833    Willy Eddy, MD 05/05/20  2027  

## 2020-05-06 LAB — POCT PREGNANCY, URINE: Preg Test, Ur: NEGATIVE

## 2020-05-07 ENCOUNTER — Other Ambulatory Visit: Payer: Self-pay

## 2020-05-07 ENCOUNTER — Emergency Department
Admission: EM | Admit: 2020-05-07 | Discharge: 2020-05-07 | Disposition: A | Discharge status: Home / Self Care | Payer: Medicaid Other | Attending: Emergency Medicine | Admitting: Emergency Medicine

## 2020-05-07 ENCOUNTER — Encounter: Payer: Self-pay | Admitting: Emergency Medicine

## 2020-05-07 DIAGNOSIS — F17210 Nicotine dependence, cigarettes, uncomplicated: Secondary | ICD-10-CM | POA: Insufficient documentation

## 2020-05-07 DIAGNOSIS — J45901 Unspecified asthma with (acute) exacerbation: Secondary | ICD-10-CM | POA: Diagnosis not present

## 2020-05-07 DIAGNOSIS — Z79899 Other long term (current) drug therapy: Secondary | ICD-10-CM | POA: Insufficient documentation

## 2020-05-07 DIAGNOSIS — R062 Wheezing: Secondary | ICD-10-CM | POA: Diagnosis present

## 2020-05-07 MED ORDER — IPRATROPIUM-ALBUTEROL 0.5-2.5 (3) MG/3ML IN SOLN
3.0000 mL | Freq: Once | RESPIRATORY_TRACT | Status: AC
  Administered 2020-05-07: 3 mL via RESPIRATORY_TRACT
  Filled 2020-05-07: qty 3

## 2020-05-07 MED ORDER — ALBUTEROL SULFATE (2.5 MG/3ML) 0.083% IN NEBU: 2.5000 mg | INHALATION_SOLUTION | Freq: Four times a day (QID) | RESPIRATORY_TRACT | 12 refills | Status: AC | PRN

## 2020-05-07 MED ORDER — DEXAMETHASONE 10 MG/ML FOR PEDIATRIC ORAL USE
16.0000 mg | Freq: Once | INTRAMUSCULAR | Status: AC
  Administered 2020-05-07: 16 mg via ORAL
  Filled 2020-05-07: qty 2

## 2020-05-07 NOTE — ED Provider Notes (Signed)
Emergency Department Provider Note  ____________________________________________  Time seen: Approximately 9:11 PM  I have reviewed the triage vital signs and the nursing notes.   HISTORY  Chief Complaint Asthma   Historian Patient     HPI Wendy Holmes is a 34 y.o. female presents to the emergency department with concern for wheezing.  Patient states that she was seen and evaluated on 05/05/2020 and has been taking prednisone as directed and using her albuterol inhaler.  She states that her wheezing has not resolved and she became concerned.  She has had some mild chest tightness but denies current shortness of breath.  No fever or chills at home.  No other alleviating measures have been attempted.   Past Medical History:  Diagnosis Date  . Anxiety   . Bipolar 1 disorder (HCC)   . CIN III (cervical intraepithelial neoplasia III)   . Depression   . History of LEEP (loop electrosurgical excision procedure) of cervix complicating pregnancy, second trimester    2012     Immunizations up to date:  Yes.     Past Medical History:  Diagnosis Date  . Anxiety   . Bipolar 1 disorder (HCC)   . CIN III (cervical intraepithelial neoplasia III)   . Depression   . History of LEEP (loop electrosurgical excision procedure) of cervix complicating pregnancy, second trimester    2012    Patient Active Problem List   Diagnosis Date Noted  . Manic depression (HCC) 04/21/2020  . Bipolar 2 disorder (HCC) 04/21/2020    Past Surgical History:  Procedure Laterality Date  . CHOLECYSTECTOMY    . TONSILLECTOMY    . TYMPANOSTOMY TUBE PLACEMENT     childhood  . URETERAL STENT PLACEMENT      Prior to Admission medications   Medication Sig Start Date End Date Taking? Authorizing Provider  albuterol (PROVENTIL) (2.5 MG/3ML) 0.083% nebulizer solution Take 3 mLs (2.5 mg total) by nebulization every 6 (six) hours as needed for wheezing or shortness of breath. 05/07/20   Orvil Feil,  PA-C  etonogestrel (NEXPLANON) 68 MG IMPL implant 1 each by Subdermal route once.    [provider]  Lurasidone HCl 60 MG TABS Take by mouth.    [provider]  predniSONE (DELTASONE) 10 MG tablet Take 5 tablets tomorrow, 4 tablets the second day, 3 tablets third day, 2 tablets on fourth day and 1 tablet on the fifth day 05/05/20   Tommi Rumps, PA-C    Allergies Patient has no known allergies.  History reviewed. No pertinent family history.  Social History Social History   Tobacco Use  . Smoking status: Current Every Day Smoker    Types: Cigarettes  . Smokeless tobacco: Never Used  Substance Use Topics  . Alcohol use: Yes    Comment: 2x/year  . Drug use: Never     Review of Systems  Constitutional: No fever/chills Eyes:  No discharge ENT: No upper respiratory complaints. Respiratory: Patient has wheezing.  Gastrointestinal:   No nausea, no vomiting.  No diarrhea.  No constipation. Musculoskeletal: Negative for musculoskeletal pain. Skin: Negative for rash, abrasions, lacerations, ecchymosis.    ____________________________________________   PHYSICAL EXAM:  VITAL SIGNS: ED Triage Vitals  Enc Vitals Group     BP 05/07/20 1520 123/80     Pulse Rate 05/07/20 1520 92     Resp 05/07/20 1520 20     Temp 05/07/20 1520 98.2 F (36.8 C)     Temp Source 05/07/20 1520 Oral  SpO2 05/07/20 1520 100 %     Weight 05/07/20 1521 200 lb (90.7 kg)     Height 05/07/20 1521 5\' 4"  (1.626 m)     Head Circumference --      Peak Flow --      Pain Score 05/07/20 1521 6     Pain Loc --      Pain Edu? --      Excl. in [place]? --      Constitutional: Alert and oriented. Well appearing and in no acute distress. Eyes: Conjunctivae are normal. PERRL. EOMI. Head: Atraumatic. Cardiovascular: Normal rate, regular rhythm. Normal S1 and S2.  Good peripheral circulation. Respiratory: Normal respiratory effort without tachypnea or retractions.  Patient has diffuse  expiratory wheezing auscultated bilaterally.  Good air entry to the bases with no decreased or absent breath sounds Gastrointestinal: Bowel sounds x 4 quadrants. Soft and nontender to palpation. No guarding or rigidity. No distention. Musculoskeletal: Full range of motion to all extremities. No obvious deformities noted Neurologic:  Normal for age. No gross focal neurologic deficits are appreciated.  Skin:  Skin is warm, dry and intact. No rash noted. Psychiatric: Mood and affect are normal for age. Speech and behavior are normal.   ____________________________________________   LABS (all labs ordered are listed, but only abnormal results are displayed)  Labs Reviewed - No data to display ____________________________________________  EKG   ____________________________________________  RADIOLOGY   No results found.  ____________________________________________    PROCEDURES  Procedure(s) performed:     Procedures     Medications  ipratropium-albuterol (DUONEB) 0.5-2.5 (3) MG/3ML nebulizer solution 3 mL (3 mLs Nebulization Given 05/07/20 1635)  dexamethasone (DECADRON) 10 MG/ML injection for Pediatric ORAL use 16 mg (16 mg Oral Given 05/07/20 1635)  ipratropium-albuterol (DUONEB) 0.5-2.5 (3) MG/3ML nebulizer solution 3 mL (3 mLs Nebulization Given 05/07/20 1717)     ____________________________________________   INITIAL IMPRESSION / ASSESSMENT AND PLAN / ED COURSE  Pertinent labs & imaging results that were available during my care of the patient were reviewed by me and considered in my medical decision making (see chart for details).      Assessment and plan Wheezing 34 year old female presents to the emergency department with concern for wheezing.  Patient was mildly tachycardic at triage but vital signs were otherwise reassuring.  She had diffuse expiratory wheezing auscultated.  Patient was given 2 duo nebs and oral Decadron in the emergency department and  wheezing resolved.  She was discharged with a prescription for nebulized albuterol.  Return precautions were given to return with new or worsening symptoms.  All patient questions were answered.  ____________________________________________  FINAL CLINICAL IMPRESSION(S) / ED DIAGNOSES  Final diagnoses:  Moderate asthma with exacerbation, unspecified whether persistent      NEW MEDICATIONS STARTED DURING THIS VISIT:  ED Discharge Orders         Ordered    albuterol (PROVENTIL) (2.5 MG/3ML) 0.083% nebulizer solution  Every 6 hours PRN     05/07/20 1745              This chart was dictated using voice recognition software/Dragon. Despite best efforts to proofread, errors can occur which can change the meaning. Any change was purely unintentional.     Lannie Fields, PA-C 05/07/20 2113    Drenda Freeze, MD 05/07/20 [phone]

## 2020-05-07 NOTE — ED Triage Notes (Signed)
Pt arrived via POV with reports of asthma, pt reports she was started on prednisone and inhalers which she has been using every 6 hours.   Pt states when she coughs the wheezing clears up, but then comes back.  Pt has hx of asthma. Audible wheezing heard in triage.

## 2020-07-13 ENCOUNTER — Ambulatory Visit: Payer: Medicaid Other

## 2020-07-19 ENCOUNTER — Ambulatory Visit: Payer: Medicaid Other

## 2020-07-21 ENCOUNTER — Ambulatory Visit: Payer: Medicaid Other

## 2020-08-08 ENCOUNTER — Ambulatory Visit: Payer: Medicaid Other

## 2020-08-10 ENCOUNTER — Other Ambulatory Visit: Payer: Self-pay

## 2020-08-10 ENCOUNTER — Encounter: Payer: Self-pay | Admitting: Advanced Practice Midwife

## 2020-08-10 ENCOUNTER — Ambulatory Visit (LOCAL_COMMUNITY_HEALTH_CENTER): Payer: Medicaid Other | Admitting: Advanced Practice Midwife

## 2020-08-10 VITALS — BP 125/76 | Ht 65.0 in | Wt 205.0 lb

## 2020-08-10 DIAGNOSIS — Z3046 Encounter for surveillance of implantable subdermal contraceptive: Secondary | ICD-10-CM

## 2020-08-10 DIAGNOSIS — Z3009 Encounter for other general counseling and advice on contraception: Secondary | ICD-10-CM

## 2020-08-10 DIAGNOSIS — F1410 Cocaine abuse, uncomplicated: Secondary | ICD-10-CM

## 2020-08-10 DIAGNOSIS — Z30017 Encounter for initial prescription of implantable subdermal contraceptive: Secondary | ICD-10-CM

## 2020-08-10 DIAGNOSIS — J45909 Unspecified asthma, uncomplicated: Secondary | ICD-10-CM

## 2020-08-10 DIAGNOSIS — F3170 Bipolar disorder, currently in remission, most recent episode unspecified: Secondary | ICD-10-CM

## 2020-08-10 DIAGNOSIS — E669 Obesity, unspecified: Secondary | ICD-10-CM | POA: Insufficient documentation

## 2020-08-10 DIAGNOSIS — F1290 Cannabis use, unspecified, uncomplicated: Secondary | ICD-10-CM | POA: Insufficient documentation

## 2020-08-10 DIAGNOSIS — Z9889 Other specified postprocedural states: Secondary | ICD-10-CM

## 2020-08-10 DIAGNOSIS — F17200 Nicotine dependence, unspecified, uncomplicated: Secondary | ICD-10-CM

## 2020-08-10 LAB — WET PREP FOR TRICH, YEAST, CLUE
Trichomonas Exam: NEGATIVE
Yeast Exam: NEGATIVE

## 2020-08-10 MED ORDER — ETONOGESTREL 68 MG ~~LOC~~ IMPL
68.0000 mg | DRUG_IMPLANT | Freq: Once | SUBCUTANEOUS | Status: AC
  Administered 2020-08-10: 68 mg via SUBCUTANEOUS

## 2020-08-10 MED ORDER — MULTIVITAMINS PO CAPS: 1.0000 | ORAL_CAPSULE | Freq: Every day | ORAL | 0 refills | Status: AC

## 2020-08-10 NOTE — Progress Notes (Signed)
[place]et [place] results reviewed. Per standing orders no treatment indicated. Jaimie Pippins, RN  

## 2020-08-10 NOTE — Progress Notes (Signed)
[facility] [address] Main Number: [phone]    Family Planning Visit- Initial Visit  Subjective:  Wendy Holmes is a 34 y.o.SWF exsmoker  [id number] (14,7)  being seen today for an initial well woman visit and to discuss family planning options.  She is currently using Nexplanon for pregnancy prevention. Patient reports she does not want a pregnancy in the next year.  Patient has the following medical conditions has Manic depression (HCC)/Bipolar dx'd age 74; Bipolar 2 disorder (HCC); Obesity BMI=34.1; Smoker--last use 05/2020; Cocaine abuse (HCC)--last use 2019; Marijuana use; and H/O LEEP 09/2011 on their problem list.  Chief Complaint  Patient presents with  . Contraception  . Procedure    Patient reports wants Nexplanon removal/reinsertion.  Last PE, pap 06/2017 at [facility].  Nexplanon inserted at [facility] 06/2017.  LMP 06/09/20.  Last sex 06/25/20 without condom; with current partner x 1.5 years.  1 sex partner in last 3 mo.  Last MJ 2020.  Last cocaine 2 years ago.  Last ETOH 07/25/20 (3 mixed drinks) 1x/mo.  Hx 2012 abnormal pap with LEEP 09/2011.  Pt thinks normal pap 06/2017 at [facility].  HIV 05/13/18.  Patient denies any chronic conditions  Body mass index is 34.11 kg/m. - Patient is eligible for diabetes screening based on BMI and age >60?  not applicable HA1C ordered? not applicable  Patient reports 1 of partners in last year. Desires STI screening?  No - doesn't want bloodwork  Has patient been screened once for HCV in the past?  No  No results found for: HCVAB  Does the patient have current drug use (including MJ), have a partner with drug use, and/or has been incarcerated since last result? No  If yes-- Screen for HCV through [facility]   Does the patient meet criteria for HBV testing? No  Criteria:  -Household, sexual or needle sharing contact with HBV -History of drug use -HIV positive -Those  with known Hep C   Health Maintenance Due  Topic Date Due  . Hepatitis C Screening  Never done  . COVID-19 Vaccine (1) Never done  . TETANUS/TDAP  Never done  . PAP SMEAR-Modifier  Never done  . INFLUENZA VACCINE  07/23/2020    Review of Systems  Respiratory: Positive for shortness of breath (05/2020 pt states due to asthma attack).   All other systems reviewed and are negative.   The following portions of the patient's history were reviewed and updated as appropriate: allergies, current medications, past family history, past medical history, past social history, past surgical history and problem list. Problem list updated.   See flowsheet for other program required questions.  Objective:   Vitals:   08/10/20 1511  BP: 125/76  Weight: 205 lb (93 kg)  Height: 5\' 5"  (1.651 m)    Physical Exam Constitutional:      Appearance: Normal appearance. She is obese.  HENT:     Head: Normocephalic and atraumatic.     Mouth/Throat:     Mouth: Mucous membranes are moist.  Eyes:     Conjunctiva/sclera: Conjunctivae normal.  Cardiovascular:     Rate and Rhythm: Normal rate and regular rhythm.  Pulmonary:     Effort: Pulmonary effort is normal.     Breath sounds: Normal breath sounds.  Chest:     Breasts:        Right: Normal.        Left: Normal.  Abdominal:  Palpations: Abdomen is soft.     Comments: Increased adipose, soft without tenderness, poor tone  Genitourinary:    General: Normal vulva.     Exam position: Lithotomy position.     Vagina: Vaginal discharge (increased white creamy leukorrhea, ph<4.5) present.     Cervix: Friability (very friable to pap) present.     Uterus: Normal.      Adnexa: Right adnexa normal and left adnexa normal.     Rectum: Normal.  Musculoskeletal:        General: Normal range of motion.     Cervical back: Normal range of motion and neck supple.  Skin:    General: Skin is warm and dry.  Neurological:     Mental Status: She is alert.   Psychiatric:        Mood and Affect: Mood normal.       Assessment and Plan:  [name] is a 34 y.o. female presenting to the [facility] Department for an initial well woman exam/family planning visit  Contraception counseling: Reviewed all forms of birth control options in the tiered based approach. available including abstinence; over the counter/barrier methods; hormonal contraceptive medication including pill, patch, ring, injection,contraceptive implant, ECP; hormonal and nonhormonal IUDs; permanent sterilization options including vasectomy and the various tubal sterilization modalities. Risks, benefits, and typical effectiveness rates were reviewed.  Questions were answered.  Written information was also given to the patient to review.  Patient desires Nexplanon, this was prescribed for patient. She will follow up in prn for surveillance.  She was told to call with any further questions, or with any concerns about this method of contraception.  Emphasized use of condoms 100% of the time for STI prevention.  Patient was offered ECP. ECP was not accepted by the patient. ECP counseling was not given - see RN documentation  1. Obesity, unspecified classification, unspecified obesity type, unspecified whether serious comorbidity present   2. Family planning Treat wet mount per standing orders Immunization nurse consult - WET PREP FOR TRICH, YEAST, CLUE - Chlamydia/Gonorrhea [facility] - IGP, Aptima HPV - etonogestrel (NEXPLANON) implant 68 mg  3. Encounter for surveillance of implantable subdermal contraceptive Nexplanon Removal and Insertion  Patient identified, informed consent performed, consent signed.   Patient does understand that irregular bleeding is a very common side effect of this medication. She was advised to have backup contraception for one week after replacement of the implant. Patient deemed to meet WHO criteria for being reasonably certain she is not  pregnant.  Appropriate time out taken. Nexplanon site identified. Area prepped in usual sterile fashon. 3 ml of 1% lidocaine with epinephrine was used to anesthetize the area at the distal end of the implant. A small stab incision was made right beside the implant on the distal portion. The Nexplanon rod was grasped using hemostats and removed without difficulty. There was minimal blood loss. There were no complications.   Confirmed correct location of insertion site. The insertion site was identified 8-10 cm (3-4 inches) from the medial epicondyle of the humerus and 3-5 cm (1.25-2 inches) posterior to (below) the sulcus (groove) between the biceps and triceps muscles of the patient's left arm. New Nexplanon removed from packaging, Device confirmed in needle, then inserted full length of needle and withdrawn per handbook instructions. Nexplanon was able to palpated in the patient's left arm; patient palpated the insert herself.  There was minimal blood loss. Patient insertion site covered with guaze and a pressure bandage to  reduce any bruising. The patient tolerated the procedure well and was given post procedure instructions.   Nexplanon:   Counseled patient to take OTC analgesic starting as soon as lidocaine starts to wear off and take regularly for at least 48 hr to decrease discomfort.  Specifically to take with food or milk to decrease stomach upset and for IB 600 mg (3 tablets) every 6 hrs; IB 800 mg (4 tablets) every 8 hrs; or Aleve 2 tablets every 12 hrs.    4. Smoker--last use 05/2020 Counseled not to smoke  5. Cocaine abuse (HCC)--last use 2019 Counseled not to use  6. Bipolar disorder in partial remission, most recent episode unspecified type (HCC)   7. Marijuana use Last use 2020  8. H/O LEEP 09/2011      Return in about 1 year (around 08/10/2021).  Future Appointments  Date Time Provider Department Center  08/14/2020 10:30 AM [facility]     Alberteen Spindle, CNM

## 2020-08-10 NOTE — Progress Notes (Signed)
Here today for PE and Nexplanon removal/reinsertion. No Epic or centricity records on PE's or Pap Smears. Patient states she had a Physical, Pap Smear and Nexplanon Insertion at [facility] in 06/2017. Wants STD screening. Declines bloodwork. Tawny Hopping, RN

## 2020-08-14 ENCOUNTER — Ambulatory Visit: Payer: Medicaid Other | Attending: Internal Medicine

## 2020-08-14 DIAGNOSIS — Z23 Encounter for immunization: Secondary | ICD-10-CM

## 2020-08-14 NOTE — Progress Notes (Signed)
   Covid-19 Vaccination Clinic  Name:  Wendy Holmes    MRN: [id number] DOB: March 13, 1986  08/14/2020  [name] was observed post Covid-19 immunization for 15 minutes without incident. She was provided with Vaccine Information Sheet and instruction to access the V-Safe system.   [name] was instructed to call 911 with any severe reactions post vaccine: Marland Kitchen Difficulty breathing  . Swelling of face and throat  . A fast heartbeat  . A bad rash all over body  . Dizziness and weakness   Immunizations Administered    Name Date Dose VIS Date Route   Pfizer COVID-19 Vaccine 08/14/2020 10:43 AM 0.3 mL 02/16/2019 Intramuscular   Manufacturer: [employer], [employer]   Lot: [id number]   NDC: [address]-2

## 2020-08-15 LAB — IGP, APTIMA HPV
HPV Aptima: NEGATIVE
PAP Smear Comment: 0

## 2020-09-04 ENCOUNTER — Ambulatory Visit: Payer: Medicaid Other

## 2020-10-24 DIAGNOSIS — R768 Other specified abnormal immunological findings in serum: Secondary | ICD-10-CM | POA: Insufficient documentation

## 2020-11-20 ENCOUNTER — Emergency Department
Admission: EM | Admit: 2020-11-20 | Discharge: 2020-11-20 | Disposition: A | Discharge status: Home / Self Care | Payer: Medicaid Other | Attending: Emergency Medicine | Admitting: Emergency Medicine

## 2020-11-20 ENCOUNTER — Other Ambulatory Visit: Payer: Self-pay

## 2020-11-20 DIAGNOSIS — J069 Acute upper respiratory infection, unspecified: Secondary | ICD-10-CM

## 2020-11-20 DIAGNOSIS — J45909 Unspecified asthma, uncomplicated: Secondary | ICD-10-CM | POA: Diagnosis not present

## 2020-11-20 DIAGNOSIS — R059 Cough, unspecified: Secondary | ICD-10-CM | POA: Diagnosis present

## 2020-11-20 DIAGNOSIS — Z20822 Contact with and (suspected) exposure to covid-19: Secondary | ICD-10-CM | POA: Diagnosis not present

## 2020-11-20 DIAGNOSIS — Z87891 Personal history of nicotine dependence: Secondary | ICD-10-CM | POA: Diagnosis not present

## 2020-11-20 LAB — RESP PANEL BY RT-PCR (FLU A&B, COVID) ARPGX2
Influenza A by PCR: NEGATIVE
Influenza B by PCR: NEGATIVE
SARS Coronavirus 2 by RT PCR: NEGATIVE

## 2020-11-20 MED ORDER — PREDNISONE 20 MG PO TABS
60.0000 mg | ORAL_TABLET | Freq: Once | ORAL | Status: AC
  Administered 2020-11-20: 60 mg via ORAL
  Filled 2020-11-20: qty 3

## 2020-11-20 MED ORDER — IPRATROPIUM-ALBUTEROL 0.5-2.5 (3) MG/3ML IN SOLN
3.0000 mL | Freq: Once | RESPIRATORY_TRACT | Status: AC
  Administered 2020-11-20: 3 mL via RESPIRATORY_TRACT
  Filled 2020-11-20: qty 3

## 2020-11-20 MED ORDER — PREDNISONE 50 MG PO TABS: 50.0000 mg | ORAL_TABLET | Freq: Every day | ORAL | 0 refills | Status: DC

## 2020-11-20 NOTE — ED Provider Notes (Signed)
[facility] Emergency Department Provider Note   ____________________________________________    I have reviewed the triage vital signs and the nursing notes.   HISTORY  Chief Complaint Cough and Shortness of Breath     HPI Wendy Holmes is a 34 y.o. female with a history of asthma who presents with complaints of wheezing, cough mild shortness of breath.  She has been using her nebulizers with improvement but only temporary.  She reports family has similar cough and upper respiratory infection.  Had COVID-19 2 months ago.  No calf pain or swelling  Past Medical History:  Diagnosis Date  . Anxiety   . Asthma   . Bipolar 1 disorder (HCC)   . CIN III (cervical intraepithelial neoplasia III)   . Depression   . History of LEEP (loop electrosurgical excision procedure) of cervix complicating pregnancy, second trimester    2012  . Vaginal Pap smear, abnormal     Patient Active Problem List   Diagnosis Date Noted  . Hepatitis B surface antigen positive 10/24/2020  . Obesity BMI=34.1 08/10/2020  . Smoker--last use 05/2020 08/10/2020  . Cocaine abuse (HCC)--last use 2019 08/10/2020  . Marijuana use 08/10/2020  . H/O LEEP 09/2011 08/10/2020  . Asthma 08/10/2020  . Manic depression (HCC)/Bipolar dx'd age 59 04/21/2020  . Bipolar 2 disorder (HCC) 04/21/2020    Past Surgical History:  Procedure Laterality Date  . CHOLECYSTECTOMY    . TONSILLECTOMY    . TYMPANOSTOMY TUBE PLACEMENT     childhood  . URETERAL STENT PLACEMENT      Prior to Admission medications   Medication Sig Start Date End Date Taking? Authorizing Provider  albuterol (PROVENTIL) (2.5 MG/3ML) 0.083% nebulizer solution Take 3 mLs (2.5 mg total) by nebulization every 6 (six) hours as needed for wheezing or shortness of breath. 05/07/20   Orvil Feil, PA-C  etonogestrel (NEXPLANON) 68 MG IMPL implant 1 each by Subdermal route once.    [provider]  Lurasidone HCl 60 MG  TABS Take by mouth.    [provider]  predniSONE (DELTASONE) 50 MG tablet Take 1 tablet (50 mg total) by mouth daily with breakfast. 11/20/20   Jene Every, MD     Allergies Patient has no known allergies.  History reviewed. No pertinent family history.  Social History Social History   Tobacco Use  . Smoking status: Former Smoker    Types: Cigarettes    Quit date: 06/10/2020    Years since quitting: 0.4  . Smokeless tobacco: Never Used  Vaping Use  . Vaping Use: Never used  Substance Use Topics  . Alcohol use: Yes    Alcohol/week: 3.0 standard drinks    Types: 3 Standard drinks or equivalent per week    Comment: 1x/mo  . Drug use: Not Currently    Types: Marijuana, Cocaine    Review of Systems  Constitutional: No fever/chills  ENT: No sore throat.  No congestion Respiratory: Positive cough  Gastrointestinal: No abdominal pain.     Musculoskeletal: Negative for extremity pain Skin: Negative for rash. Neurological: Negative for headaches     ____________________________________________   PHYSICAL EXAM:  VITAL SIGNS: ED Triage Vitals  Enc Vitals Group     BP 11/20/20 1158 108/73     Pulse Rate 11/20/20 1158 86     Resp 11/20/20 1158 (!) 21     Temp 11/20/20 1158 98.7 F (37.1 C)     Temp Source 11/20/20 1158 Oral  SpO2 11/20/20 1158 98 %     Weight 11/20/20 1131 90.7 kg (200 lb)     Height 11/20/20 1131 1.626 m (5\' 4" )     Head Circumference --      Peak Flow --      Pain Score 11/20/20 1131 4     Pain Loc --      Pain Edu? --      Excl. in [place]? --      Constitutional: Alert and oriented. No acute distress. Pleasant and interactive Eyes: Conjunctivae are normal.  Head: Atraumatic. Nose: No congestion/rhinnorhea. Mouth/Throat: Mucous membranes are moist.   Cardiovascular: Normal rate, regular rhythm.  Respiratory: Normal respiratory effort.  No retractions.  Scattered mild wheezes Genitourinary: deferred Musculoskeletal: No lower  extremity tenderness nor edema.   Neurologic:  Normal speech and language. No gross focal neurologic deficits are appreciated.   Skin:  Skin is warm, dry and intact. No rash noted.   ____________________________________________   LABS (all labs ordered are listed, but only abnormal results are displayed)  Labs Reviewed  RESP PANEL BY RT-PCR (FLU A&B, COVID) [id number]   ____________________________________________  EKG   ____________________________________________  RADIOLOGY  None ____________________________________________   PROCEDURES  Procedure(s) performed: No  Procedures   Critical Care performed: No ____________________________________________   INITIAL IMPRESSION / ASSESSMENT AND PLAN / ED COURSE  Pertinent labs & imaging results that were available during my care of the patient were reviewed by me and considered in my medical decision making (see chart for details).  Patient presents with upper respiratory infection likely exacerbating asthma, afebrile here.  Family members also being evaluated for similar upper respiratory complaints.  Treated with DuoNeb, prednisone, will DC with prednisone, Covid swab sent.   ____________________________________________   FINAL CLINICAL IMPRESSION(S) / ED DIAGNOSES  Final diagnoses:  Viral URI with cough      NEW MEDICATIONS STARTED DURING THIS VISIT:  Discharge Medication List as of 11/20/2020 12:22 PM       Note:  This document was prepared using Dragon voice recognition software and may include unintentional dictation errors.   11/22/2020, MD 11/20/20 [phone]

## 2020-11-20 NOTE — ED Notes (Signed)
Pt signed physical discharge form. Pt ambulated to lobby. 

## 2020-11-20 NOTE — ED Triage Notes (Signed)
Pt states she started w/ cough on Friday and had to take nebulizers/inhaler more often than prescribed. Pt c/o chest pain from cough and headache. Pt threw up last night. Pt denies fever. Pt is congested, cough noted.

## 2021-01-11 ENCOUNTER — Ambulatory Visit: Payer: Medicaid Other

## 2021-01-16 ENCOUNTER — Ambulatory Visit: Payer: Medicaid Other

## 2021-02-02 ENCOUNTER — Ambulatory Visit: Payer: Medicaid Other

## 2021-03-08 ENCOUNTER — Ambulatory Visit: Payer: Medicaid Other

## 2021-05-20 ENCOUNTER — Encounter: Payer: Self-pay | Admitting: Emergency Medicine

## 2021-05-20 ENCOUNTER — Other Ambulatory Visit: Payer: Self-pay

## 2021-05-20 ENCOUNTER — Ambulatory Visit
Admission: EM | Admit: 2021-05-20 | Discharge: 2021-05-20 | Disposition: A | Discharge status: Home / Self Care | Payer: Medicaid Other | Attending: Physician Assistant | Admitting: Physician Assistant

## 2021-05-20 DIAGNOSIS — R8290 Unspecified abnormal findings in urine: Secondary | ICD-10-CM

## 2021-05-20 DIAGNOSIS — M545 Low back pain, unspecified: Secondary | ICD-10-CM | POA: Insufficient documentation

## 2021-05-20 LAB — URINALYSIS, COMPLETE (UACMP) WITH MICROSCOPIC
Bilirubin Urine: NEGATIVE
Glucose, UA: NEGATIVE mg/dL
Hgb urine dipstick: NEGATIVE
Ketones, ur: NEGATIVE mg/dL
Leukocytes,Ua: NEGATIVE
Nitrite: POSITIVE — AB
Protein, ur: NEGATIVE mg/dL
Specific Gravity, Urine: 1.02 (ref 1.005–1.030)
pH: 7 (ref 5.0–8.0)

## 2021-05-20 MED ORDER — METHYLPREDNISOLONE 4 MG PO TBPK
ORAL_TABLET | ORAL | 0 refills | Status: AC
Start: 2021-05-20 — End: 2021-05-26

## 2021-05-20 MED ORDER — KETOROLAC TROMETHAMINE 60 MG/2ML IM SOLN
60.0000 mg | Freq: Once | INTRAMUSCULAR | Status: AC
  Administered 2021-05-20: 60 mg via INTRAMUSCULAR

## 2021-05-20 MED ORDER — CEPHALEXIN 500 MG PO CAPS: 500.0000 mg | ORAL_CAPSULE | Freq: Two times a day (BID) | ORAL | 0 refills | Status: AC

## 2021-05-20 MED ORDER — TIZANIDINE HCL 4 MG PO TABS
4.0000 mg | ORAL_TABLET | Freq: Three times a day (TID) | ORAL | 0 refills | Status: AC | PRN
Start: 2021-05-20 — End: 2021-05-30

## 2021-05-20 NOTE — ED Triage Notes (Signed)
Patient c/o lower hip and lower back pain off and on for a month.  Patient states that this morning her pain was worse.

## 2021-05-20 NOTE — Discharge Instructions (Addendum)
BACK PAIN: Stressed avoiding painful activities . RICE (REST, ICE, COMPRESSION, ELEVATION) guidelines reviewed. May alternate ice and heat. Consider use of muscle rubs, Salonpas patches, etc. Use medications as directed including muscle relaxers if prescribed. Take anti-inflammatory medications as prescribed or OTC NSAIDs/Tylenol.  F/u with PCP in 7-10 days for reexamination, and please feel free to call or return to the urgent care at any time for any questions or concerns you may have and we will be happy to help you!   BACK PAIN RED FLAGS: If the back pain acutely worsens or there are any red flag symptoms such as numbness/tingling, leg weakness, saddle anesthesia, or loss of bowel/bladder control, go immediately to the ER. Follow up with [place] as scheduled or sooner if the pain does not begin to resolve or if it worsens before the follow up    You may have a condition requiring you to follow up with Orthopedics (concerned about another disc issue) so please call one of the following office for appointment:   [facility] [address] [place], [place] [address] Phone: [phone]  [facility] [address], [place], [place] [address] Phone: [phone]

## 2021-05-20 NOTE — ED Provider Notes (Signed)
[facility]    CSN: [id number] Arrival date & time: 05/20/21  0936      History   Chief Complaint Chief Complaint  Patient presents with  . Back Pain    HPI Wendy Holmes is a 35 y.o. female presenting for low back pain off and on x2 months.  Patient denies any injury.  She says that today her pain has gotten a lot worse.  She does it hurts all the way across lower back and into the left hip.  Pain is worse with walking and putting weight on the left leg.  Pain also increases when she lifts the left leg.  She says that the pain is a constant aching pain across her back with occasional sharp pains whenever she turns or twists.  Increased pain with extension, flexion and rotation.  Patient has not taken anything today for pain.  She says she was taking ibuprofen and Tylenol as needed before which seemed to help but now her pain is worse.  Patient reports "broken disks in my back a couple years ago."  She says that she had to be in a back brace for 1 year.  She is denying any leg weakness or falls, saddle anesthesia or loss of bowel or bladder control.  No dysuria, urinary frequency urgency or blood in the urine.  No other complaints or concerns today.  HPI  Past Medical History:  Diagnosis Date  . Anxiety   . Asthma   . Bipolar 1 disorder (HCC)   . CIN III (cervical intraepithelial neoplasia III)   . Depression   . History of LEEP (loop electrosurgical excision procedure) of cervix complicating pregnancy, second trimester    2012  . Vaginal Pap smear, abnormal     Patient Active Problem List   Diagnosis Date Noted  . Hepatitis B surface antigen positive 10/24/2020  . Obesity BMI=34.1 08/10/2020  . Smoker--last use 05/2020 08/10/2020  . Cocaine abuse (HCC)--last use 2019 08/10/2020  . Marijuana use 08/10/2020  . H/O LEEP 09/2011 08/10/2020  . Asthma 08/10/2020  . Manic depression (HCC)/Bipolar dx'd age 71 04/21/2020  . Bipolar 2 disorder (HCC) 04/21/2020    Past  Surgical History:  Procedure Laterality Date  . CHOLECYSTECTOMY    . TONSILLECTOMY    . TYMPANOSTOMY TUBE PLACEMENT     childhood  . URETERAL STENT PLACEMENT      OB History    Gravida  2   Para  2   Term  2   Preterm  0   AB  0   Living  2     SAB  0   IAB  0   Ectopic  0   Multiple  0   Live Births  2            Home Medications    Prior to Admission medications   Medication Sig Start Date End Date Taking? Authorizing Provider  cephALEXin (KEFLEX) 500 MG capsule Take 1 capsule (500 mg total) by mouth 2 (two) times daily for 7 days. 05/20/21 05/27/21 Yes Shirlee Latch, PA-C  etonogestrel (NEXPLANON) 68 MG IMPL implant 1 each by Subdermal route once.   Yes [provider]  Lurasidone HCl 60 MG TABS Take by mouth.   Yes [provider]  methylPREDNISolone (MEDROL DOSEPAK) 4 MG TBPK tablet Take p.o. according to Dosepak instructions 05/20/21 05/26/21 Yes Eusebio Friendly B, PA-C  tiZANidine (ZANAFLEX) 4 MG tablet Take 1 tablet (4 mg total) by  mouth every 8 (eight) hours as needed for up to 10 days. 05/20/21 05/30/21 Yes Eusebio FriendlyEaves, Sumire Halbleib B, PA-C  albuterol (PROVENTIL) (2.5 MG/3ML) 0.083% nebulizer solution Take 3 mLs (2.5 mg total) by nebulization every 6 (six) hours as needed for wheezing or shortness of breath. 05/07/20   Orvil FeilWoods, Jaclyn M, PA-C    Family History History reviewed. No pertinent family history.  Social History Social History   Tobacco Use  . Smoking status: Former Smoker    Types: Cigarettes    Quit date: 06/10/2020    Years since quitting: 0.9  . Smokeless tobacco: Never Used  Vaping Use  . Vaping Use: Never used  Substance Use Topics  . Alcohol use: Yes    Alcohol/week: 3.0 standard drinks    Types: 3 Standard drinks or equivalent per week    Comment: 1x/mo  . Drug use: Not Currently    Types: Marijuana, Cocaine     Allergies   Patient has no known allergies.   Review of Systems Review of Systems  Constitutional:  Negative for fatigue and fever.  Gastrointestinal: Negative for abdominal pain, nausea and vomiting.  Genitourinary: Negative for dysuria and frequency.  Musculoskeletal: Positive for back pain. Negative for gait problem.  Neurological: Negative for weakness and numbness.     Physical Exam Triage Vital Signs ED Triage Vitals  Enc Vitals Group     BP 05/20/21 0946 116/77     Pulse Rate 05/20/21 0946 79     Resp 05/20/21 0946 14     Temp 05/20/21 0946 98.6 F (37 C)     Temp Source 05/20/21 0946 Oral     SpO2 05/20/21 0946 100 %     Weight 05/20/21 0944 204 lb (92.5 kg)     Height 05/20/21 0944 5\' 4"  (1.626 m)     Head Circumference --      Peak Flow --      Pain Score 05/20/21 0944 5     Pain Loc --      Pain Edu? --      Excl. in GC? --    No data found.  Updated Vital Signs BP 116/77 (BP Location: Left Arm)   Pulse 79   Temp 98.6 F (37 C) (Oral)   Resp 14   Ht 5\' 4"  (1.626 m)   Wt 204 lb (92.5 kg)   SpO2 100%   BMI 35.02 kg/m      Physical Exam Vitals and nursing note reviewed.  Constitutional:      General: She is not in acute distress.    Appearance: Normal appearance. She is not ill-appearing or toxic-appearing.  HENT:     Head: Normocephalic and atraumatic.  Eyes:     General: No scleral icterus.       Right eye: No discharge.        Left eye: No discharge.     Conjunctiva/sclera: Conjunctivae normal.  Cardiovascular:     Rate and Rhythm: Normal rate and regular rhythm.     Heart sounds: Normal heart sounds.  Pulmonary:     Effort: Pulmonary effort is normal. No respiratory distress.     Breath sounds: Normal breath sounds.  Abdominal:     Palpations: Abdomen is soft.     Tenderness: There is no right CVA tenderness or left CVA tenderness.  Musculoskeletal:     Cervical back: Neck supple.     Lumbar back: Tenderness (TTP diffusely of lower back-- L5-S1 and bilateral paralumbar muscles. TTP of  left gluteus as well) present. Decreased range of  motion. Positive right straight leg raise test and positive left straight leg raise test.  Skin:    General: Skin is dry.  Neurological:     General: No focal deficit present.     Mental Status: She is alert. Mental status is at baseline.     Motor: No weakness.     Gait: Gait normal.  Psychiatric:        Mood and Affect: Mood normal.        Behavior: Behavior normal.        Thought Content: Thought content normal.      UC Treatments / Results  Labs (all labs ordered are listed, but only abnormal results are displayed) Labs Reviewed  URINALYSIS, COMPLETE (UACMP) WITH MICROSCOPIC - Abnormal; Notable for the following components:      Result Value   APPearance HAZY (*)    Nitrite POSITIVE (*)    Bacteria, UA MANY (*)    All other components within normal limits  URINE CULTURE    EKG   Radiology No results found.  Procedures Procedures (including critical care time)  Medications Ordered in UC Medications  ketorolac (TORADOL) injection 60 mg (60 mg Intramuscular Given 05/20/21 1006)    Initial Impression / Assessment and Plan / UC Course  I have reviewed the triage vital signs and the nursing notes.  Pertinent labs & imaging results that were available during my care of the patient were reviewed by me and considered in my medical decision making (see chart for details).   35 year old female presenting for lower back pain off and on x2 months which has become worse today.  The back pain is exacerbated by movement and she has limited range of motion due to pain.  Admits to history of back problems.  Vital signs are all normal and stable.  Patient is overall well-appearing but does appear to be in pain and uncomfortable in the exam room.   Urinalysis obtained by nursing staff does show positive nitrites and many bacteria.  We will culture urine.  I have given patient a prescription for Keflex to fill if the other medications are not helping or the culture is  positive.  Suspect her pain is due to musculoskeletal cause, specifically disc derangement.  Treating at this time with Medrol.  Patient given 60 mg IM ketorolac in clinic.  Also sent tizanidine and encouraged her to use heat and Tylenol if needed for pain relief.  Advised to follow-up with Ortho if not improving with the corticosteroids.  ED precautions for back pain reviewed patient.  Work note given.   Final Clinical Impressions(s) / UC Diagnoses   Final diagnoses:  Acute low back pain, unspecified back pain laterality, unspecified whether sciatica present  Abnormal urinalysis     Discharge Instructions     BACK PAIN: Stressed avoiding painful activities . RICE (REST, ICE, COMPRESSION, ELEVATION) guidelines reviewed. May alternate ice and heat. Consider use of muscle rubs, Salonpas patches, etc. Use medications as directed including muscle relaxers if prescribed. Take anti-inflammatory medications as prescribed or OTC NSAIDs/Tylenol.  F/u with PCP in 7-10 days for reexamination, and please feel free to call or return to the urgent care at any time for any questions or concerns you may have and we will be happy to help you!   BACK PAIN RED FLAGS: If the back pain acutely worsens or there are any red flag symptoms such as numbness/tingling, leg weakness, saddle anesthesia,  or loss of bowel/bladder control, go immediately to the ER. Follow up with [place] as scheduled or sooner if the pain does not begin to resolve or if it worsens before the follow up    You may have a condition requiring you to follow up with Orthopedics (concerned about another disc issue) so please call one of the following office for appointment:   [facility] [address] [place], [place] [address] Phone: [phone]  [facility] [address], [place], [place] [address] Phone: [phone]     ED Prescriptions    Medication Sig Dispense Auth. Provider   methylPREDNISolone (MEDROL DOSEPAK) 4 MG TBPK tablet  Take p.o. according to Dosepak instructions 21 tablet Eusebio Friendly B, PA-C   tiZANidine (ZANAFLEX) 4 MG tablet Take 1 tablet (4 mg total) by mouth every 8 (eight) hours as needed for up to 10 days. 25 tablet Eusebio Friendly B, PA-C   cephALEXin (KEFLEX) 500 MG capsule Take 1 capsule (500 mg total) by mouth 2 (two) times daily for 7 days. 14 capsule Shirlee Latch, PA-C     I have reviewed the PDMP during this encounter.   Shirlee Latch, PA-C 05/20/21 1015

## 2021-05-23 LAB — URINE CULTURE: Culture: >=100000 — AB

## 2021-07-29 ENCOUNTER — Other Ambulatory Visit: Payer: Self-pay

## 2021-07-29 ENCOUNTER — Emergency Department
Admission: EM | Admit: 2021-07-29 | Discharge: 2021-07-29 | Disposition: A | Discharge status: Home / Self Care | Payer: Medicaid Other | Attending: Emergency Medicine | Admitting: Emergency Medicine

## 2021-07-29 ENCOUNTER — Encounter: Payer: Self-pay | Admitting: Emergency Medicine

## 2021-07-29 DIAGNOSIS — J45909 Unspecified asthma, uncomplicated: Secondary | ICD-10-CM | POA: Diagnosis not present

## 2021-07-29 DIAGNOSIS — L509 Urticaria, unspecified: Secondary | ICD-10-CM | POA: Insufficient documentation

## 2021-07-29 DIAGNOSIS — Z87891 Personal history of nicotine dependence: Secondary | ICD-10-CM | POA: Insufficient documentation

## 2021-07-29 DIAGNOSIS — R21 Rash and other nonspecific skin eruption: Secondary | ICD-10-CM | POA: Diagnosis present

## 2021-07-29 MED ORDER — LORATADINE 10 MG PO TABS
10.0000 mg | ORAL_TABLET | ORAL | Status: AC
  Administered 2021-07-29: 10 mg via ORAL

## 2021-07-29 MED ORDER — DEXAMETHASONE 6 MG PO TABS
10.0000 mg | ORAL_TABLET | Freq: Once | ORAL | Status: AC
  Administered 2021-07-29: 10 mg via ORAL
  Filled 2021-07-29: qty 1

## 2021-07-29 NOTE — ED Provider Notes (Signed)
[facility] Emergency Department Provider Note   ____________________________________________   Event Date/Time   First MD Initiated Contact with Patient 07/29/21 1839     (approximate)  I have reviewed the triage vital signs and the nursing notes.   HISTORY  Chief Complaint Rash    HPI Wendy Holmes is a 35 y.o. female reports a history of asthma  Was at work, she was in a hot environment 105 degrees she started noticed some small bumps and itching on both of her shoulders.  She notified her supervisor who recommended she come for evaluation or get a work note before returning to work  Denies any recent illness no fevers chills or cough.  She is not short of breath she does not have any swelling on her mouth or difficulty swallowing or feeling she have any trouble breathing.  Denies asthma symptoms no recent illness.  She reports the areas of small bumps that are slightly itchy over both of her shoulders and has never had this occur before.  Symptoms started on her way into work not too long after she ate breaded fish but that is not unusual at all for her diet.  Cannot think of any exposures or allergens.  Questions if this might be heat related.  Patient wants to make sure it is not "monkey pox"   Past Medical History:  Diagnosis Date   Anxiety    Asthma    Bipolar 1 disorder (HCC)    CIN III (cervical intraepithelial neoplasia III)    Depression    History of LEEP (loop electrosurgical excision procedure) of cervix complicating pregnancy, second trimester    2012   Vaginal Pap smear, abnormal     Patient Active Problem List   Diagnosis Date Noted   Hepatitis B surface antigen positive 10/24/2020   Obesity BMI=34.1 08/10/2020   Smoker--last use 05/2020 08/10/2020   Cocaine abuse (HCC)--last use 2019 08/10/2020   Marijuana use 08/10/2020   H/O LEEP 09/2011 08/10/2020   Asthma 08/10/2020   Manic depression (HCC)/Bipolar dx'd age 32  04/21/2020   Bipolar 2 disorder (HCC) 04/21/2020    Past Surgical History:  Procedure Laterality Date   CHOLECYSTECTOMY     TONSILLECTOMY     TYMPANOSTOMY TUBE PLACEMENT     childhood   URETERAL STENT PLACEMENT      Prior to Admission medications   Medication Sig Start Date End Date Taking? Authorizing Provider  albuterol (PROVENTIL) (2.5 MG/3ML) 0.083% nebulizer solution Take 3 mLs (2.5 mg total) by nebulization every 6 (six) hours as needed for wheezing or shortness of breath. 05/07/20   Orvil Feil, PA-C  etonogestrel (NEXPLANON) 68 MG IMPL implant 1 each by Subdermal route once.    [provider]  Lurasidone HCl 60 MG TABS Take by mouth.    [provider]    Allergies Patient has no known allergies.  No family history on file.  Social History Social History   Tobacco Use   Smoking status: Former    Types: Cigarettes    Quit date: 06/10/2020    Years since quitting: 1.1   Smokeless tobacco: Never  Vaping Use   Vaping Use: Never used  Substance Use Topics   Alcohol use: Yes    Alcohol/week: 3.0 standard drinks    Types: 3 Standard drinks or equivalent per week    Comment: 1x/mo   Drug use: Not Currently    Types: Marijuana, Cocaine    Review of Systems  Constitutional: No fever/chills ENT: No sore throat.  No throat swelling. Cardiovascular: Denies chest pain. Respiratory: Denies shortness of breath. Gastrointestinal: No abdominal pain.  Denies pregnancy.  Has Implanon Musculoskeletal: No muscle Skin: Negative for rash. Neurological: Negative for headaches, areas of focal weakness or numbness.    ____________________________________________   PHYSICAL EXAM:  VITAL SIGNS: ED Triage Vitals  Enc Vitals Group     BP 07/29/21 1750 133/80     Pulse Rate 07/29/21 1750 81     Resp 07/29/21 1750 20     Temp 07/29/21 1750 98.7 F (37.1 C)     Temp Source 07/29/21 1750 Oral     SpO2 07/29/21 1750 100 %     Weight 07/29/21 1737 190 lb  (86.2 kg)     Height 07/29/21 1737 5\' 4"  (1.626 m)     Head Circumference --      Peak Flow --      Pain Score 07/29/21 1737 0     Pain Loc --      Pain Edu? --      Excl. in GC? --     Constitutional: Alert and oriented. Well appearing and in no acute distress. Eyes: Conjunctivae are normal. Head: Atraumatic. Nose: No congestion/rhinnorhea. Mouth/Throat: Mucous membranes are moist.  Airway widely patent without any edema.  Speaks in full clear voice.  No muffled. Neck: No stridor.  Cardiovascular: Normal rate, regular rhythm. Grossly normal heart sounds.  Good peripheral circulation. Respiratory: Normal respiratory effort.  No retractions. Lungs CTAB. Gastrointestinal: Soft and nontender. No distention. Musculoskeletal: No lower extremity tenderness nor edema. Neurologic:  Normal speech and language. No gross focal neurologic deficits are appreciated.  Skin:  Skin is warm, dry and intact. No rash noted except for occasional small urticarial lesions over both shoulders which itch, also a few speckled across her mid and upper back which the patient was unaware of..  No pox-like skin lesions. Psychiatric: Mood and affect are normal. Speech and behavior are normal.  ____________________________________________   LABS (all labs ordered are listed, but only abnormal results are displayed)  Labs Reviewed - No data to display ____________________________________________  EKG   ____________________________________________  RADIOLOGY   ____________________________________________   PROCEDURES  Procedure(s) performed: None  Procedures  Critical Care performed: No  ____________________________________________   INITIAL IMPRESSION / ASSESSMENT AND PLAN / ED COURSE  Pertinent labs & imaging results that were available during my care of the patient were reviewed by me and considered in my medical decision making (see chart for details).   Exam consistent with urticarial  rash, etiology is somewhat unclear but may be allergic highest on differential at this point.  No infectious symptoms no evidence of infection.  No clinical evidence to point towards monkey pox which the patient is concerned about.  Very reassuring exam.  No evidence of systemic sepsis allergic reaction such as anaphylaxis.  Seems to be limited to the skin only and minor areas.  Awake alert well-appearing.  Treat with antihistamine and Decadron.  Advised patient of careful return precautions also recommendation that she may wish to follow-up with [facility] for allergist evaluation in the future.  Return precautions and treatment recommendations and follow-up discussed with the patient who is agreeable with the plan.         ____________________________________________   FINAL CLINICAL IMPRESSION(S) / ED DIAGNOSES  Final diagnoses:  Urticaria        Note:  This document was prepared using Dragon voice recognition software and  may include unintentional dictation errors       Sharyn Creamer, MD 07/29/21 1906

## 2021-07-29 NOTE — ED Notes (Signed)
Pt presents to the ED for rash to bilateral upper arms on both sides pt denies any pain or itchy. Pt states she tried to got to work and they sent her here. Pt is A&Ox4 and NAD. Ambulatory to triage room.

## 2021-07-29 NOTE — ED Triage Notes (Signed)
Pt reports about an hour ago had a rash that showed up on both of her arms. Pt reports no itching or pain

## 2021-08-08 ENCOUNTER — Emergency Department: Payer: Medicaid Other

## 2021-08-08 ENCOUNTER — Emergency Department
Admission: EM | Admit: 2021-08-08 | Discharge: 2021-08-08 | Disposition: A | Discharge status: Home / Self Care | Payer: Medicaid Other | Attending: Emergency Medicine | Admitting: Emergency Medicine

## 2021-08-08 ENCOUNTER — Other Ambulatory Visit: Payer: Self-pay

## 2021-08-08 DIAGNOSIS — R202 Paresthesia of skin: Secondary | ICD-10-CM | POA: Insufficient documentation

## 2021-08-08 DIAGNOSIS — M545 Low back pain, unspecified: Secondary | ICD-10-CM | POA: Diagnosis present

## 2021-08-08 DIAGNOSIS — R200 Anesthesia of skin: Secondary | ICD-10-CM | POA: Insufficient documentation

## 2021-08-08 DIAGNOSIS — J45909 Unspecified asthma, uncomplicated: Secondary | ICD-10-CM | POA: Diagnosis not present

## 2021-08-08 DIAGNOSIS — M5442 Lumbago with sciatica, left side: Secondary | ICD-10-CM | POA: Diagnosis not present

## 2021-08-08 DIAGNOSIS — F17210 Nicotine dependence, cigarettes, uncomplicated: Secondary | ICD-10-CM | POA: Insufficient documentation

## 2021-08-08 LAB — URINALYSIS, COMPLETE (UACMP) WITH MICROSCOPIC
Bilirubin Urine: NEGATIVE
Glucose, UA: NEGATIVE mg/dL
Hgb urine dipstick: NEGATIVE
Ketones, ur: NEGATIVE mg/dL
Leukocytes,Ua: NEGATIVE
Nitrite: NEGATIVE
Protein, ur: NEGATIVE mg/dL
Specific Gravity, Urine: 1.008 (ref 1.005–1.030)
pH: 6 (ref 5.0–8.0)

## 2021-08-08 IMAGING — MR MR LUMBAR SPINE W/O CM
5 series · 31 of 48 positions shown · non-contrast
Comparison: Radiograph [DATE].

CLINICAL DATA: Low back pain, > 6 wks; Low back pain, progressive
neurologic deficit.

EXAM:
MRI LUMBAR SPINE WITHOUT CONTRAST
TECHNIQUE: Multiplanar, multisequence MR imaging of the lumbar spine was
performed. No intravenous contrast was administered.

[Series 5: T2 · sagittal · 4.0mm · 0.81mm/px · 6 of 15 slices shown (1 of 2)]
[im 1/15]
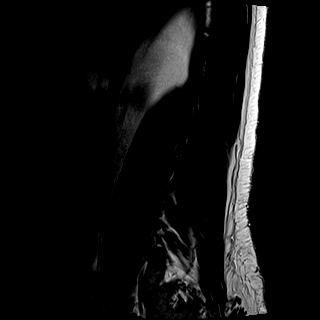
[im 3/15]
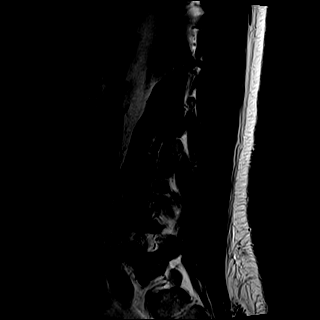
[im 6/15]
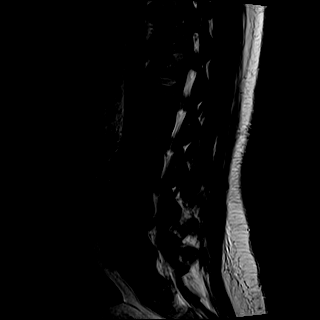
[im 9/15]
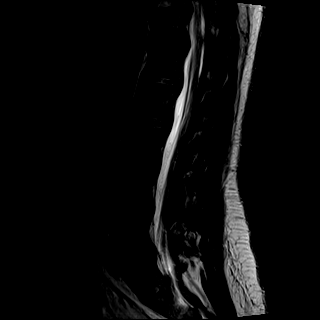
[im 12/15]
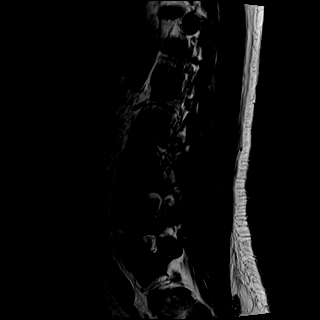
[im 15/15]
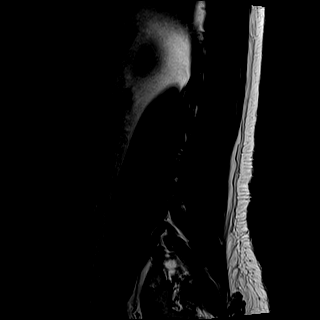

[Series 6: T1 · sagittal · 4.0mm · 0.81mm/px · 7 of 15 slices shown (1 of 2)]
[im 1/15]
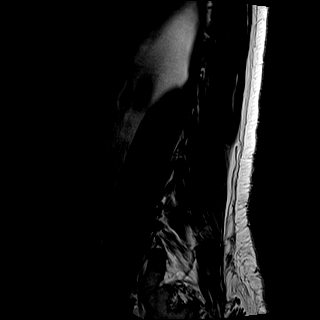
[im 3/15]
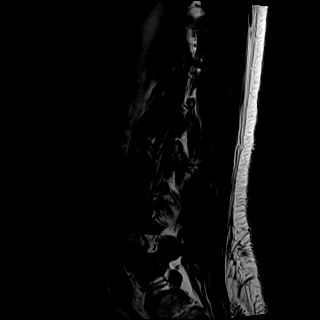
[im 5/15]
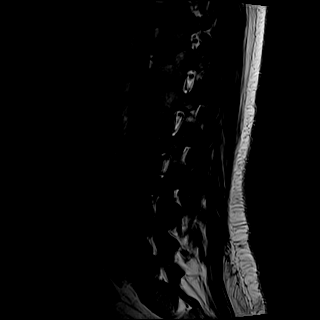
[im 8/15]
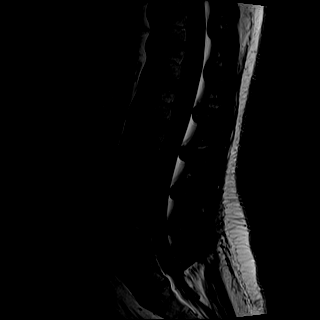
[im 10/15]
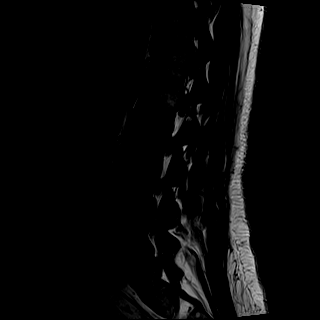
[im 12/15]
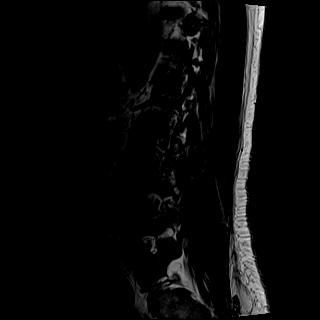
[im 15/15]
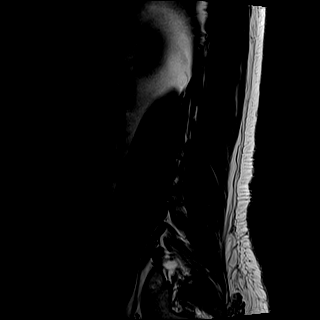

[Series 7: STIR · sagittal · 4.0mm · 0.41mm/px · 2 of 15 slices shown]
[im 1/15]
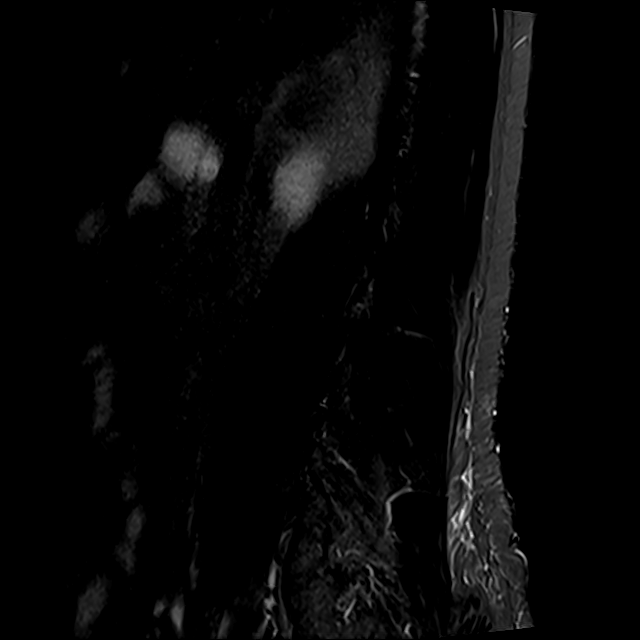
[im 3/15]
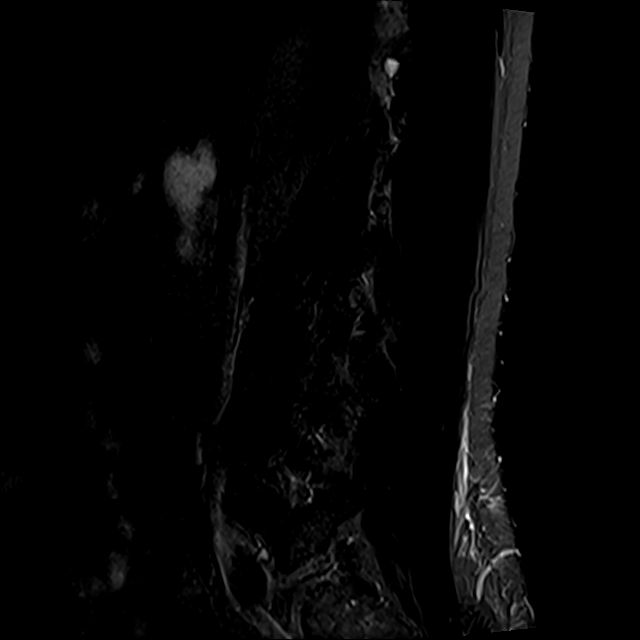

[Series 8: T2 · axial · 4.0mm · 0.78mm/px · z∈[-81,+101]mm · 8 of 30 slices shown (2 of 2)]
[im 1/30]
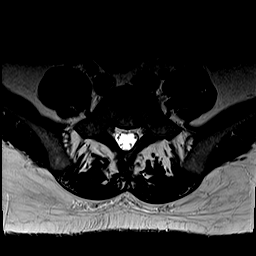
[im 5/30]
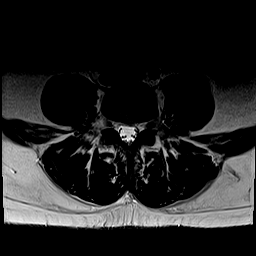
[im 9/30]
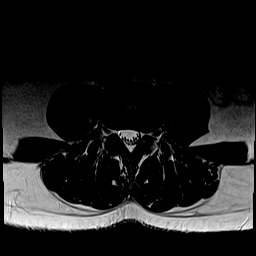
[im 14/30]
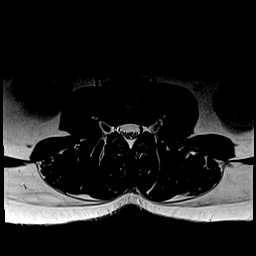
[im 16/30]
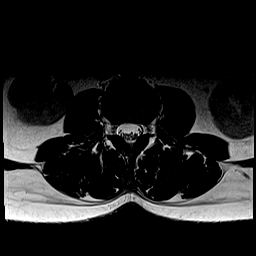
[im 21/30]
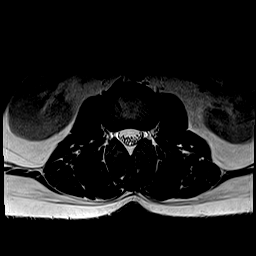
[im 25/30]
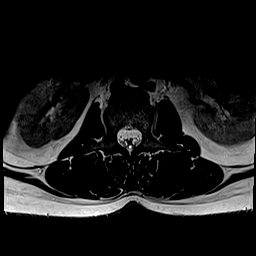
[im 30/30]
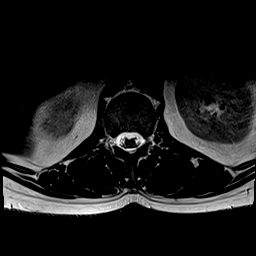

[Series 9: T1 · axial · 4.0mm · 0.39mm/px · z∈[-81,+101]mm · 8 of 30 slices shown (2 of 2)]
[im 1/30]
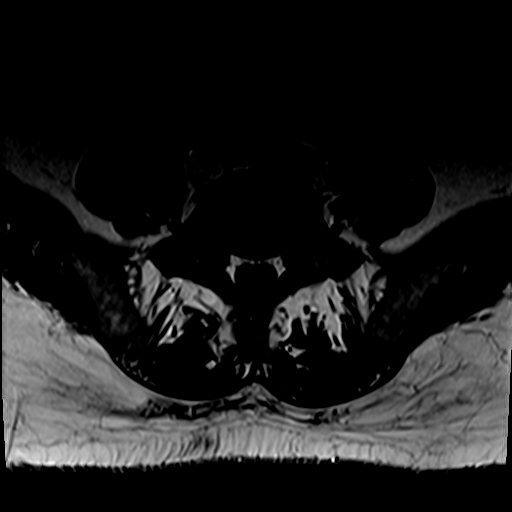
[im 5/30]
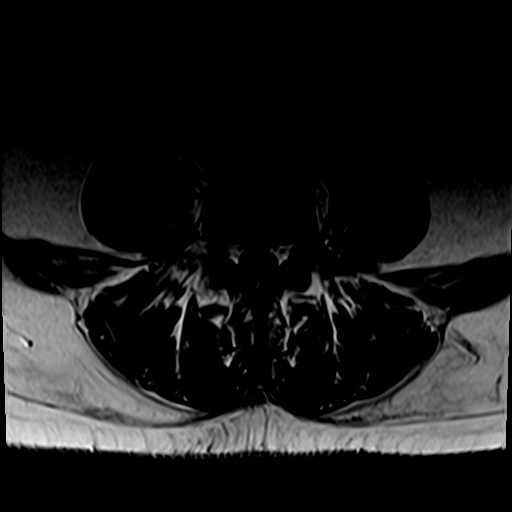
[im 9/30]
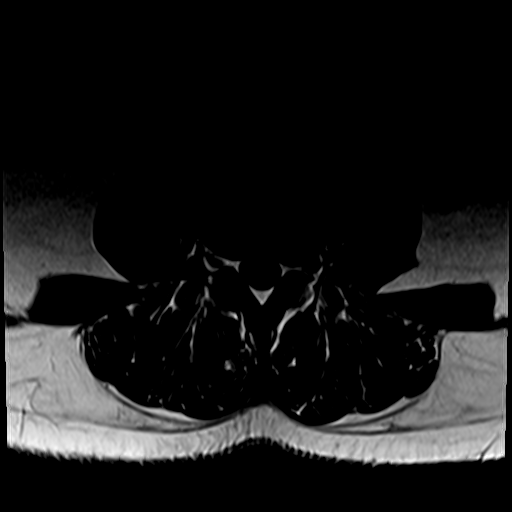
[im 14/30]
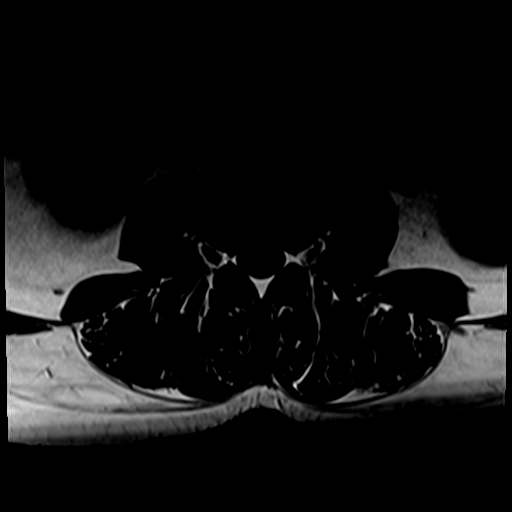
[im 16/30]
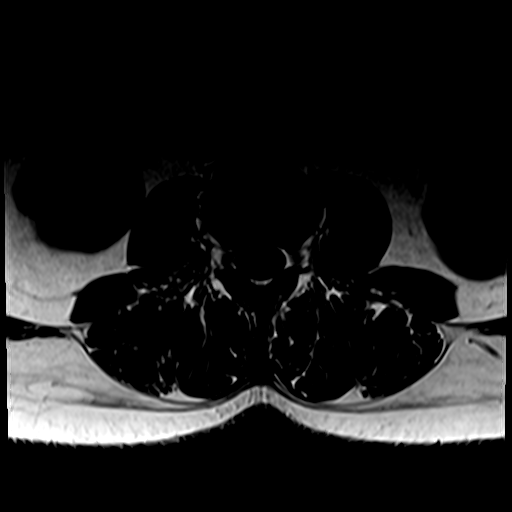
[im 21/30]
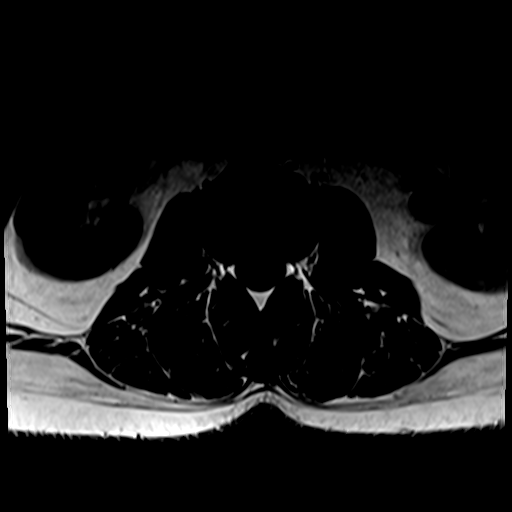
[im 25/30]
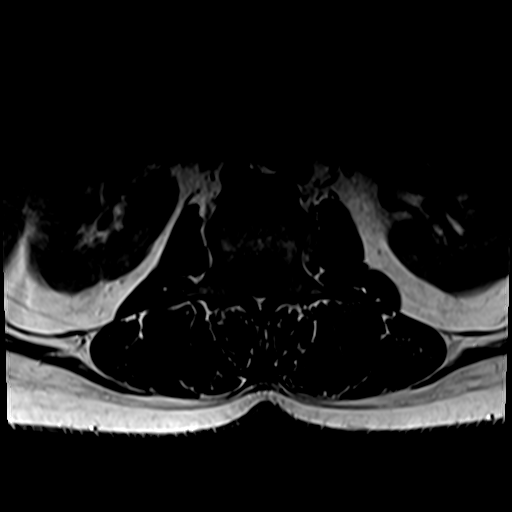
[im 30/30]
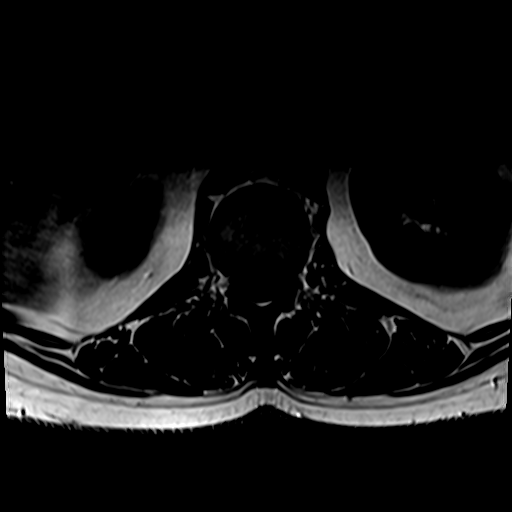

[31 of 48 positions shown; findings below may reference images not displayed]

FINDINGS: Segmentation: A transitional lumbosacral vertebra is assumed to
represent the S1 level. A rudimentary disc is seen at S1-2. Careful
correlation with this numbering strategy prior to any procedural
intervention would be recommended.

Alignment:  Physiologic.

Vertebrae: Chronic compression fractures of the superior endplate of
T12 and L1 with associated Schmorl nodes. No retropulsion. Marrow
edema is seen in the visualized portion of the T11 inferior
endplate. This may be degenerative. However other etiologies cannot
be excluded given that most of the T11 vertebral body is not
included in the current images. No evidence of discitis or
aggressive bone lesion.

Conus medullaris and cauda equina: Conus extends to the L2 level.
Conus and cauda equina appear normal.

Paraspinal and other soft tissues: Negative.

Disc levels:

T12-L1:No spinal canal or neural foraminal stenosis.

L1-2:No spinal canal or neural foraminal stenosis.

L2-3:No spinal canal or neural foraminal stenosis.

L3-4:No spinal canal or neural foraminal stenosis.

L4-5:Minimal disc bulge and mild facet degenerative changes.No
significant spinal canal or neural foraminal stenosis.

L5-S1:Mild facet degenerative changes.No spinal canal or neural
foraminal stenosis.
IMPRESSION: 1. Mild chronic compression fractures of the T12 and L1 superior
endplate to associated Schmorl nodes. No retropulsion.
2. Marrow edema in the T11 inferior endplate. While this may be
degenerative, other etiologies cannot be excluded given that most of
the T11 vertebral body is not included in the current study.
3. Mild degenerative changes of the lower lumbar spine without
significant spinal canal or neural foraminal stenosis at any level.

## 2021-08-08 MED ORDER — TRAMADOL HCL 50 MG PO TABS: 50.0000 mg | ORAL_TABLET | Freq: Four times a day (QID) | ORAL | 0 refills | Status: DC | PRN

## 2021-08-08 MED ORDER — METHYLPREDNISOLONE 4 MG PO TBPK: ORAL_TABLET | ORAL | 0 refills | Status: AC

## 2021-08-08 MED ORDER — ONDANSETRON HCL 4 MG/2ML IJ SOLN
4.0000 mg | Freq: Once | INTRAMUSCULAR | Status: AC
  Administered 2021-08-08: 4 mg via INTRAVENOUS
  Filled 2021-08-08: qty 2

## 2021-08-08 MED ORDER — DEXAMETHASONE SODIUM PHOSPHATE 10 MG/ML IJ SOLN
10.0000 mg | Freq: Once | INTRAMUSCULAR | Status: AC
  Administered 2021-08-08: 10 mg via INTRAVENOUS
  Filled 2021-08-08: qty 1

## 2021-08-08 MED ORDER — MORPHINE SULFATE (PF) 4 MG/ML IV SOLN
4.0000 mg | Freq: Once | INTRAVENOUS | Status: AC
  Administered 2021-08-08: 4 mg via INTRAVENOUS
  Filled 2021-08-08: qty 1

## 2021-08-08 MED ORDER — LIDOCAINE 5 % EX PTCH
1.0000 | MEDICATED_PATCH | CUTANEOUS | Status: DC
  Administered 2021-08-08: 1 via TRANSDERMAL
  Filled 2021-08-08: qty 1

## 2021-08-08 MED ORDER — BACLOFEN 10 MG PO TABS: 10.0000 mg | ORAL_TABLET | Freq: Three times a day (TID) | ORAL | 0 refills | Status: AC

## 2021-08-08 NOTE — Discharge Instructions (Signed)
Follow-up with your regular doctor as needed.  Follow-up with [facility] neurosurgery for evaluation of your continued low back pain.  Return emergency department worsening.  Take medications as prescribed.  Apply ice to the lower back.  Use lidocaine patches as needed.

## 2021-08-08 NOTE — ED Notes (Signed)
Pt states no change in pain level. Additional warm blankets provided.

## 2021-08-08 NOTE — ED Triage Notes (Signed)
Pt c/o lower back pain for the past week , denies injury, has an appt with [facility]

## 2021-08-08 NOTE — ED Notes (Signed)
Pt provided with phone to be screened for MRI

## 2021-08-08 NOTE — ED Provider Notes (Signed)
[facility] Emergency Department Provider Note  ____________________________________________   Event Date/Time   First MD Initiated Contact with Patient 08/08/21 1204     (approximate)  I have reviewed the triage vital signs and the nursing notes.   HISTORY  Chief Complaint Back Pain    HPI Wendy Holmes is a 35 y.o. female  C/o low back pain for 7 day, no known injury, pain is worse with movement, increased with bending over, has numbness, tingling in the left leg, denies changes in bowel/urinary habits,  Using otc meds without relief Remainder ros neg patient was seen at [facility].  X-ray was negative.  Patient was started on a steroid pack and Flexeril.  Has had no relief with these medications and the back pain has increased with radiation to the left leg.   Past Medical History:  Diagnosis Date   Anxiety    Asthma    Bipolar 1 disorder (HCC)    CIN III (cervical intraepithelial neoplasia III)    Depression    History of LEEP (loop electrosurgical excision procedure) of cervix complicating pregnancy, second trimester    2012   Vaginal Pap smear, abnormal     Patient Active Problem List   Diagnosis Date Noted   Hepatitis B surface antigen positive 10/24/2020   Obesity BMI=34.1 08/10/2020   Smoker--last use 05/2020 08/10/2020   Cocaine abuse (HCC)--last use 2019 08/10/2020   Marijuana use 08/10/2020   H/O LEEP 09/2011 08/10/2020   Asthma 08/10/2020   Manic depression (HCC)/Bipolar dx'd age 30 04/21/2020   Bipolar 2 disorder (HCC) 04/21/2020    Past Surgical History:  Procedure Laterality Date   CHOLECYSTECTOMY     TONSILLECTOMY     TYMPANOSTOMY TUBE PLACEMENT     childhood   URETERAL STENT PLACEMENT      Prior to Admission medications   Medication Sig Start Date End Date Taking? Authorizing Provider  baclofen (LIORESAL) 10 MG tablet Take 1 tablet (10 mg total) by mouth 3 (three) times daily for 7 days. 08/08/21 08/15/21 Yes  Sadat Sliwa, Roselyn Bering, PA-C  methylPREDNISolone (MEDROL DOSEPAK) 4 MG TBPK tablet Take 6 pills on day one then decrease by 1 pill each day 08/08/21  Yes Nyeema Want, Roselyn Bering, PA-C  traMADol (ULTRAM) 50 MG tablet Take 1 tablet (50 mg total) by mouth every 6 (six) hours as needed. 08/08/21  Yes Rayan Ines, Roselyn Bering, PA-C  albuterol (PROVENTIL) (2.5 MG/3ML) 0.083% nebulizer solution Take 3 mLs (2.5 mg total) by nebulization every 6 (six) hours as needed for wheezing or shortness of breath. 05/07/20   Orvil Feil, PA-C  etonogestrel (NEXPLANON) 68 MG IMPL implant 1 each by Subdermal route once.    [provider]  Lurasidone HCl 60 MG TABS Take by mouth.    [provider]    Allergies Patient has no known allergies.  History reviewed. No pertinent family history.  Social History Social History   Tobacco Use   Smoking status: Every Day    Types: Cigarettes    Last attempt to quit: 06/10/2020    Years since quitting: 1.1   Smokeless tobacco: Never  Vaping Use   Vaping Use: Never used  Substance Use Topics   Alcohol use: Yes    Alcohol/week: 3.0 standard drinks    Types: 3 Standard drinks or equivalent per week    Comment: 1x/mo   Drug use: Not Currently    Types: Marijuana, Cocaine    Review of Systems  Constitutional:  No fever/chills Eyes: No visual changes. ENT: No sore throat. Respiratory: Denies cough Genitourinary: Negative for dysuria. Musculoskeletal: Positive for back pain. Skin: Negative for rash.    ____________________________________________   PHYSICAL EXAM:  VITAL SIGNS: ED Triage Vitals  Enc Vitals Group     BP 08/08/21 1020 (!) 142/109     Pulse Rate 08/08/21 1020 70     Resp 08/08/21 1020 18     Temp 08/08/21 1020 98.4 F (36.9 C)     Temp Source 08/08/21 1020 Oral     SpO2 08/08/21 1020 100 %     Weight 08/08/21 1023 200 lb (90.7 kg)     Height 08/08/21 1023 5\' 4"  (1.626 m)     Head Circumference --      Peak Flow --      Pain Score  08/08/21 1023 8     Pain Loc --      Pain Edu? --      Excl. in [place]? --     Constitutional: Alert and oriented. Well appearing and in no acute distress. Eyes: Conjunctivae are normal.  Head: Atraumatic. Nose: No congestion/rhinnorhea. Mouth/Throat: Mucous membranes are moist.   Neck:  supple no lymphadenopathy noted Cardiovascular: Normal rate, regular rhythm. Heart sounds are normal Respiratory: Normal respiratory effort.  No retractions, lungs c t a  Abd: soft nontender bs normal all 4 quad GU: deferred Musculoskeletal: FROM all extremities, warm and well perfused.  Decreased rom of back due to discomfort, lumbar spine tender, positive slr, 5/5 strength in great toes b/l, 5/5 strength in lower legs, n/v intact Neurologic:  Normal speech and language.  Skin:  Skin is warm, dry and intact. No rash noted. Psychiatric: Mood and affect are normal. Speech and behavior are normal.  ____________________________________________   LABS (all labs ordered are listed, but only abnormal results are displayed)  Labs Reviewed  URINALYSIS, COMPLETE (UACMP) WITH MICROSCOPIC - Abnormal; Notable for the following components:      Result Value   Color, Urine STRAW (*)    APPearance CLEAR (*)    Bacteria, UA RARE (*)    All other components within normal limits   ____________________________________________   ____________________________________________  RADIOLOGY  MRI lumbar spine  ____________________________________________   PROCEDURES  Procedure(s) performed: No  Procedures    ____________________________________________   INITIAL IMPRESSION / ASSESSMENT AND PLAN / ED COURSE  Pertinent labs & imaging results that were available during my care of the patient were reviewed by me and considered in my medical decision making (see chart for details).   The patient is a 35 year old female presents with progressing lower back pain.  See HPI.  Physical exam shows patient be  stable Since the patient has had a negative x-ray at [facility] and tried outpatient steroids and muscle relaxers, will give the patient pain medication here along with ordering an MRI.  MRI does not show any nerve impingement.  Reviewed by me confirmed by radiology.  Radiologist comments there is some endplate marrow edema.  Chronic T-spine fracture.  UA is negative for infection  I did explain all the findings to the patient.  I feel that due to the radiculopathy she can follow-up with neurosurgery.  Injections may be an option for her.  I think that the marrow edema is mainly from trauma.  This can also be evaluated by neurosurgery.  She was given a prescription for a Medrol Dosepak, baclofen, and a Lidoderm patch was placed on her lower spine prior to  discharge.  She is to return the emergency department worsening.  She is given a work note and discharged stable condition.  As part of my medical decision making, I reviewed the following data within the electronic MEDICAL RECORD NUMBER Nursing notes reviewed and incorporated, Labs reviewed , Old chart reviewed, Radiograph reviewed , Notes from prior ED visits, and [place] Controlled Substance Database  ____________________________________________   FINAL CLINICAL IMPRESSION(S) / ED DIAGNOSES  Final diagnoses:  Acute midline low back pain with left-sided sciatica      NEW MEDICATIONS STARTED DURING THIS VISIT:  Discharge Medication List as of 08/08/2021  2:33 PM     START taking these medications   Details  baclofen (LIORESAL) 10 MG tablet Take 1 tablet (10 mg total) by mouth 3 (three) times daily for 7 days., Starting Wed 08/08/2021, Until Wed 08/15/2021, Normal    methylPREDNISolone (MEDROL DOSEPAK) 4 MG TBPK tablet Take 6 pills on day one then decrease by 1 pill each day, Normal    traMADol (ULTRAM) 50 MG tablet Take 1 tablet (50 mg total) by mouth every 6 (six) hours as needed., Starting Wed 08/08/2021, Normal         Note:   This document was prepared using Dragon voice recognition software and may include unintentional dictation errors.     Faythe Ghee, PA-C 08/08/21 1456    Shaune Pollack, MD 08/09/21 [phone]

## 2021-08-31 ENCOUNTER — Other Ambulatory Visit: Payer: Self-pay | Admitting: Neurosurgery

## 2021-08-31 DIAGNOSIS — M5412 Radiculopathy, cervical region: Secondary | ICD-10-CM

## 2021-09-13 ENCOUNTER — Ambulatory Visit: Payer: Medicaid Other

## 2021-09-29 ENCOUNTER — Ambulatory Visit
Admission: RE | Admit: 2021-09-29 | Discharge: 2021-09-29 | Disposition: A | Discharge status: Home / Self Care | Payer: Medicaid Other | Source: Ambulatory Visit | Attending: Neurosurgery | Admitting: Neurosurgery

## 2021-09-29 ENCOUNTER — Other Ambulatory Visit: Payer: Self-pay

## 2021-09-29 DIAGNOSIS — M5412 Radiculopathy, cervical region: Secondary | ICD-10-CM | POA: Insufficient documentation

## 2021-09-29 IMAGING — MR MR CERVICAL SPINE W/O CM
5 series · 38 of 48 positions shown · non-contrast
Comparison: Radiographs of the cervical spine [DATE].

CLINICAL DATA: Cervical radiculopathy. Additional history provided
by scanning technologist: Patient reports neck pain for 3 months,
pain radiates into both shoulders.

EXAM:
MRI CERVICAL SPINE WITHOUT CONTRAST
TECHNIQUE: Multiplanar, multisequence MR imaging of the cervical spine was
performed. No intravenous contrast was administered.

[Series 5: T2 · sagittal · 3.0mm · 0.62mm/px · 7 of 13 slices shown (1 of 2)]
[im 1/13]
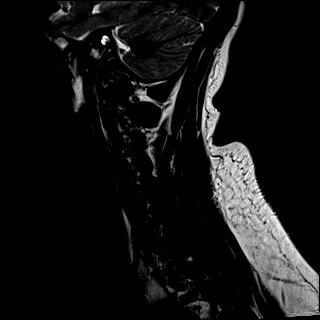
[im 3/13]
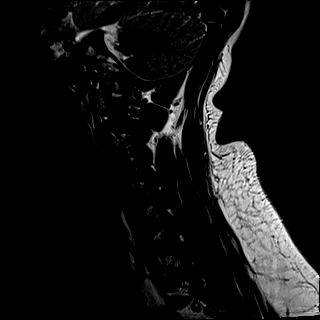
[im 5/13]
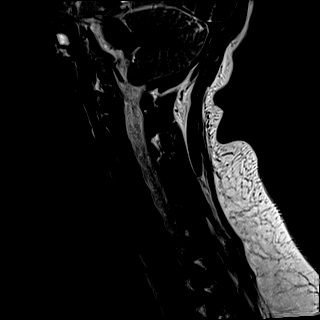
[im 7/13]
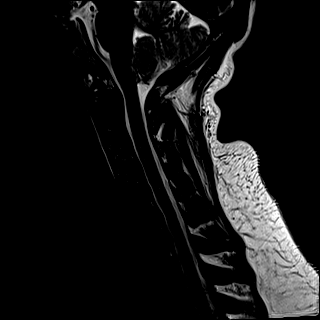
[im 9/13]
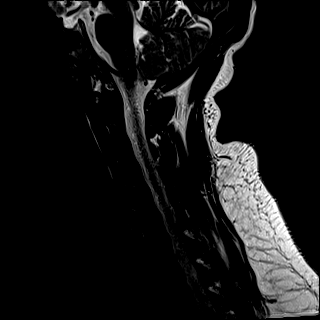
[im 11/13]
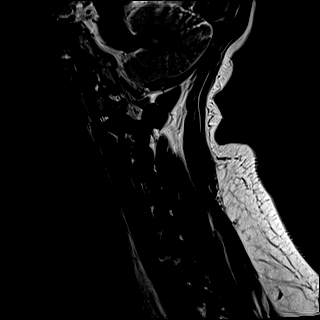
[im 13/13]
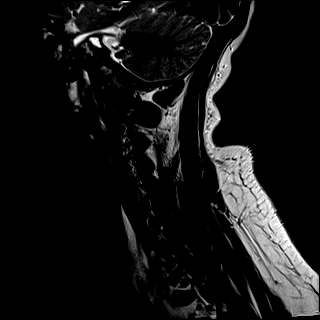

[Series 6: FLAIR · sagittal · 3.0mm · 0.78mm/px · 7 of 13 slices shown]
[im 1/13]
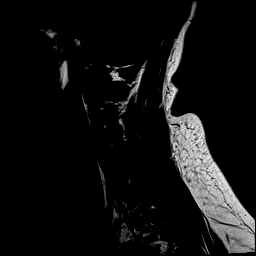
[im 3/13]
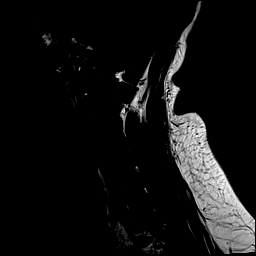
[im 5/13]
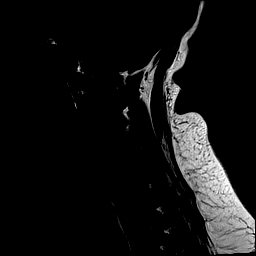
[im 7/13]
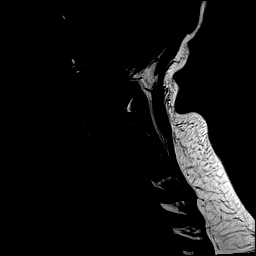
[im 9/13]
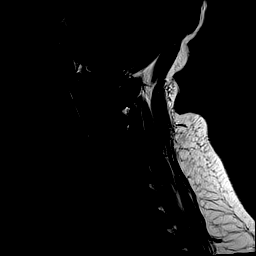
[im 11/13]
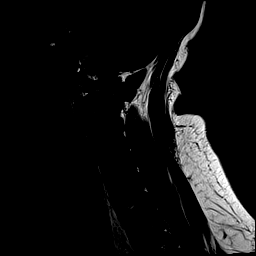
[im 13/13]
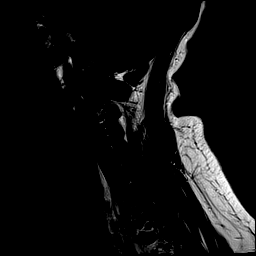

[Series 8: T2 · axial · 3.0mm · 0.70mm/px · z∈[-120,-29]mm · 10 of 29 slices shown (2 of 2)]
[im 1/29]
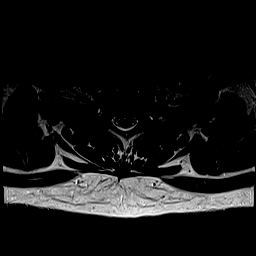
[im 3/29]
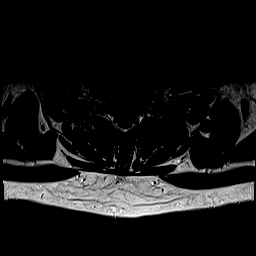
[im 5/29]
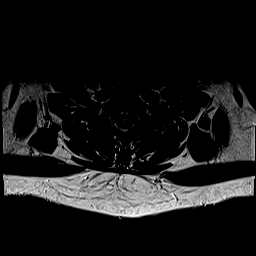
[im 7/29]
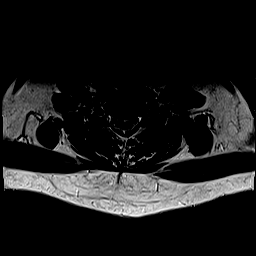
[im 9/29]
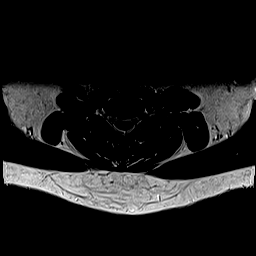
[im 13/29]
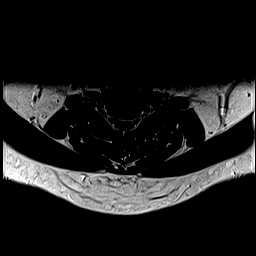
[im 16/29]
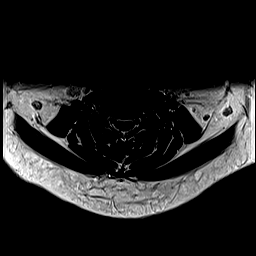
[im 20/29]
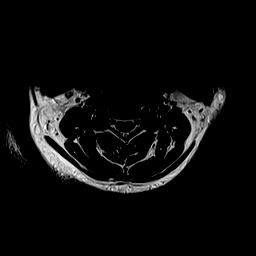
[im 24/29]
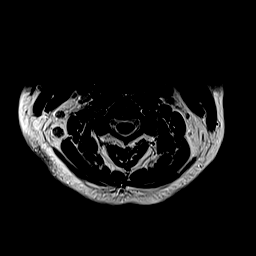
[im 29/29]
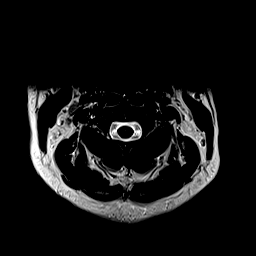

[Series 9: ax mpgr · axial · 3.0mm · 0.35mm/px · z∈[-120,-29]mm · 8 of 29 slices shown]
[im 1/29]
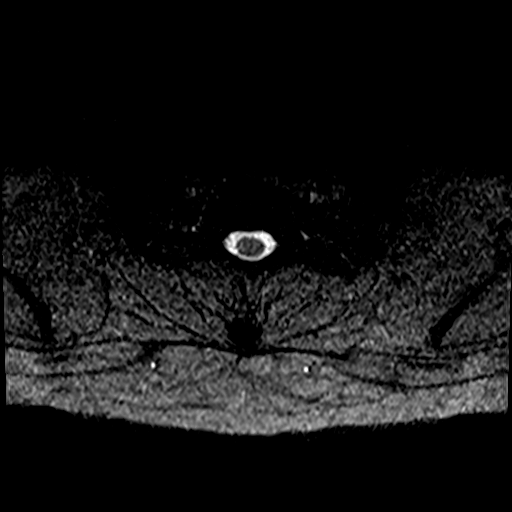
[im 5/29]
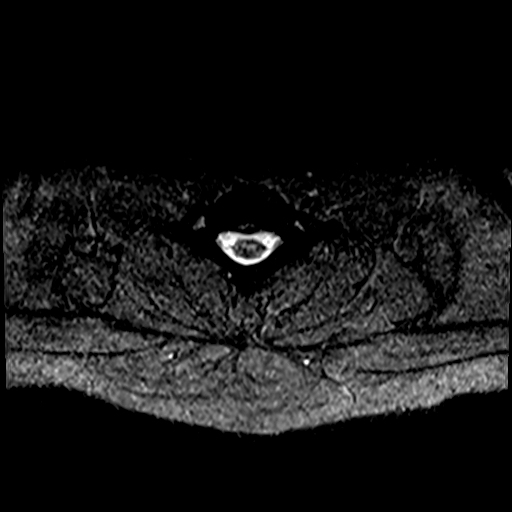
[im 9/29]
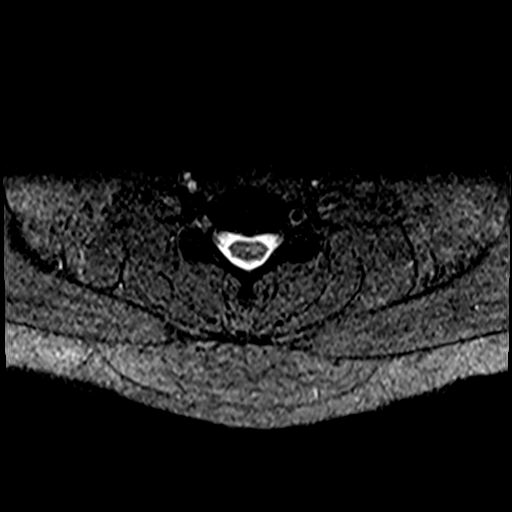
[im 13/29]
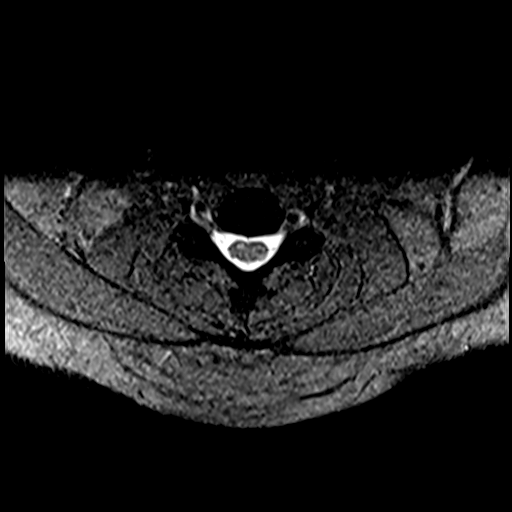
[im 16/29]
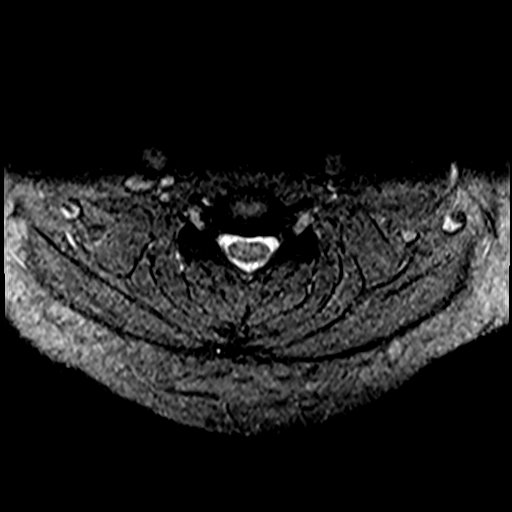
[im 20/29]
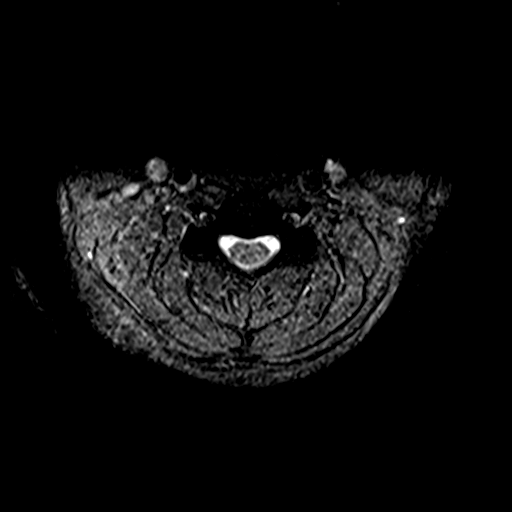
[im 24/29]
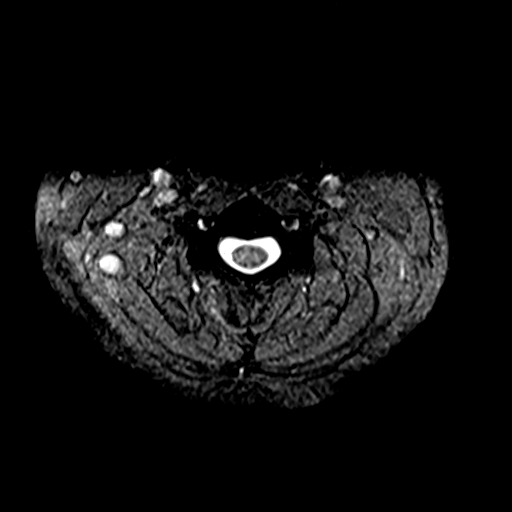
[im 29/29]
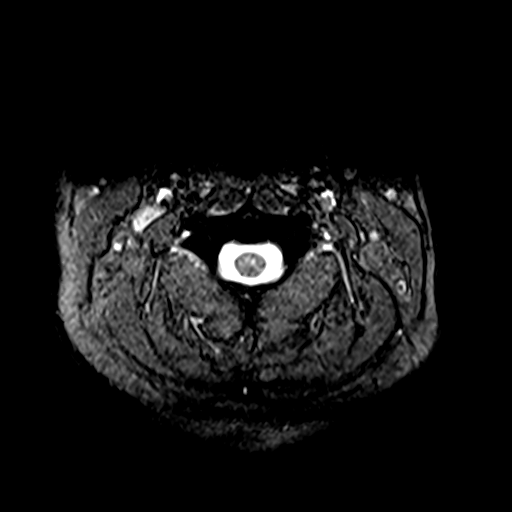

[Series 10: STIR · sagittal · 3.0mm · 0.62mm/px · 6 of 13 slices shown]
[im 1/13]
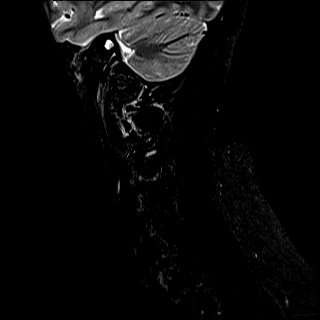
[im 3/13]
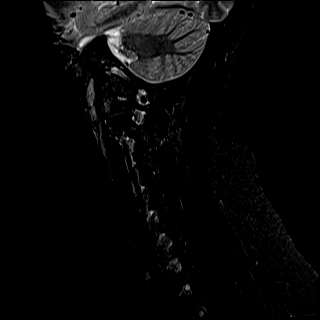
[im 5/13]
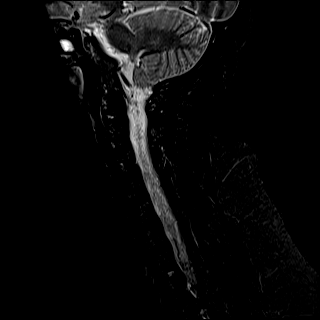
[im 8/13]
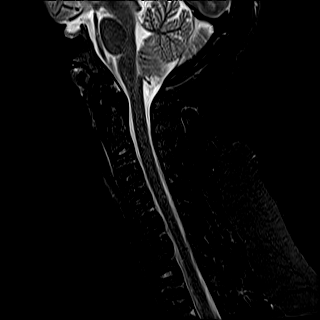
[im 10/13]
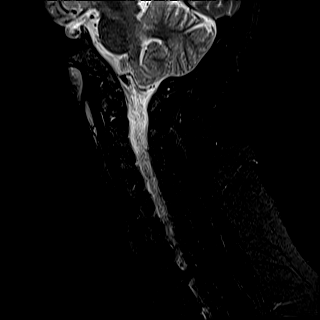
[im 13/13]
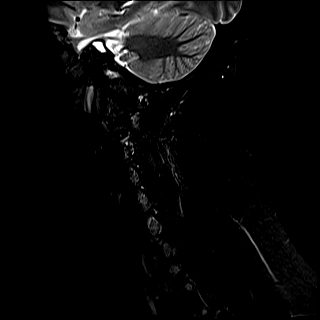

[38 of 48 positions shown; findings below may reference images not displayed]

FINDINGS: Mild intermittent motion degradation.

Alignment: Straightening of the expected cervical lordosis. No
significant spondylolisthesis.

Vertebrae: Vertebral body height is maintained. Mild marrow edema
within the C1 anterior arch and dens, likely degenerative.
Elsewhere, no significant marrow edema or focal suspicious osseous
lesion is identified.

Posterior Fossa, vertebral arteries, paraspinal tissues: Small right
sphenoid sinus mucous retention cyst. Partially empty sella turcica.
No abnormality identified within included portions of the posterior
fossa. Flow voids preserved within the imaged cervical vertebral
arteries. Paraspinal soft tissues unremarkable.

Disc levels:

Mild multilevel disc degeneration, greatest at C5-C6 and C6-C7.

C2-C3: No significant disc herniation or stenosis.

C3-C4: No significant disc herniation or spinal canal stenosis.
Minimal uncovertebral hypertrophy on the right with resultant
minimal relative right neural foraminal narrowing.

C4-C5: No significant disc herniation or stenosis.

C5-C6: Shallow disc bulge. No significant spinal canal or foraminal
stenosis.

C6-C7: Shallow disc bulge. No significant spinal canal or foraminal
stenosis.

C7-T1: No significant disc herniation or stenosis.
IMPRESSION: Cervical spondylosis, as outlined. No significant spinal canal
stenosis. Uncovertebral hypertrophy results in minimal relative
narrowing of the right C3-C4 neural foramen.

Mild marrow edema within the C1 anterior arch and within the dens,
likely degenerative.

Straightening of the expected cervical lordosis.

## 2022-03-13 ENCOUNTER — Ambulatory Visit: Payer: Medicaid Other

## 2022-08-24 ENCOUNTER — Other Ambulatory Visit: Payer: Self-pay

## 2022-08-24 ENCOUNTER — Emergency Department: Payer: Medicaid Other

## 2022-08-24 ENCOUNTER — Emergency Department
Admission: EM | Admit: 2022-08-24 | Discharge: 2022-08-24 | Disposition: A | Discharge status: Home / Self Care | Payer: Medicaid Other | Attending: Emergency Medicine | Admitting: Emergency Medicine

## 2022-08-24 DIAGNOSIS — E876 Hypokalemia: Secondary | ICD-10-CM | POA: Insufficient documentation

## 2022-08-24 DIAGNOSIS — R0789 Other chest pain: Secondary | ICD-10-CM | POA: Diagnosis present

## 2022-08-24 DIAGNOSIS — J45909 Unspecified asthma, uncomplicated: Secondary | ICD-10-CM | POA: Insufficient documentation

## 2022-08-24 LAB — BASIC METABOLIC PANEL
Anion gap: 10 (ref 5–15)
BUN: 14 mg/dL (ref 6–20)
CO2: 24 mmol/L (ref 22–32)
Calcium: 9 mg/dL (ref 8.9–10.3)
Chloride: 104 mmol/L (ref 98–111)
Creatinine, Ser: 0.88 mg/dL (ref 0.44–1.00)
GFR, Estimated: >60 mL/min (ref >60)
Glucose, Bld: 105 mg/dL — ABNORMAL HIGH (ref 70–99)
Potassium: 3.1 mmol/L — ABNORMAL LOW (ref 3.5–5.1)
Sodium: 138 mmol/L (ref 135–145)

## 2022-08-24 LAB — CBC
HCT: 43.6 % (ref 36.0–46.0)
Hemoglobin: 14.4 g/dL (ref 12.0–15.0)
MCH: 29.8 pg (ref 26.0–34.0)
MCHC: 33 g/dL (ref 30.0–36.0)
MCV: 90.3 fL (ref 80.0–100.0)
Platelets: 291 10*3/uL (ref 150–400)
RBC: 4.83 MIL/uL (ref 3.87–5.11)
RDW: 12.7 % (ref 11.5–15.5)
WBC: 8.7 10*3/uL (ref 4.0–10.5)
nRBC: 0 % (ref 0.0–0.2)

## 2022-08-24 LAB — TROPONIN I (HIGH SENSITIVITY): Troponin I (High Sensitivity): 3 ng/L (ref <18)

## 2022-08-24 MED ORDER — POTASSIUM CHLORIDE CRYS ER 20 MEQ PO TBCR
40.0000 meq | EXTENDED_RELEASE_TABLET | Freq: Once | ORAL | Status: AC
  Administered 2022-08-24: 40 meq via ORAL
  Filled 2022-08-24: qty 2

## 2022-08-24 NOTE — Discharge Instructions (Addendum)
Take acetaminophen 650 mg and ibuprofen 400 mg every 6 hours for pain.  Take with food. Your potassium level was slightly low - check with your doctor about diet changes and when to recheck the levels in the blood.   Thank you for choosing [place] for your health care today!  Please see your primary doctor this week for a follow up appointment.   Sometimes, in the early stages of certain disease courses it is difficult to detect in the emergency department evaluation -- so, it is important that you continue to monitor your symptoms and call your doctor right away or return to the emergency department if you develop any new or worsening symptoms.  It was my pleasure to care for you today.   Daneil Dan Modesto Charon, MD

## 2022-08-24 NOTE — ED Triage Notes (Signed)
Pt presents to ED with c/o of CP that radiates to L arm, pt describes it as tingling. Pt states this started the night before last.

## 2022-08-24 NOTE — ED Provider Notes (Signed)
[facility] Provider Note    Event Date/Time   First MD Initiated Contact with Patient 08/24/22 1526     (approximate)   History   Chest Pain   HPI  Wendy Holmes is a 36 y.o. female   Past medical history of bipolar, anxiety, asthma presents with 3 days of chest pain with radiation to the left arm and left arm tingling.  Nonexertional, no exacerbating or alleviating symptoms.  Denies respiratory symptoms including cough or fever.  Symptoms mostly resolved at this time.  She has had no history of blood clots, low leg swelling or pain, no immobilization, no oral contraceptive use.  History was obtained via patient and her daughter at bedside      Physical Exam   Triage Vital Signs: ED Triage Vitals  Enc Vitals Group     BP 08/24/22 1333 124/81     Pulse Rate 08/24/22 1333 79     Resp 08/24/22 1333 17     Temp 08/24/22 1333 98.6 F (37 C)     Temp Source 08/24/22 1333 Oral     SpO2 08/24/22 1333 96 %     Weight --      Height --      Head Circumference --      Peak Flow --      Pain Score 08/24/22 1336 5     Pain Loc --      Pain Edu? --      Excl. in GC? --     Most recent vital signs: Vitals:   08/24/22 1333 08/24/22 1612  BP: 124/81 118/78  Pulse: 79 82  Resp: 17 18  Temp: 98.6 F (37 C) 98.4 F (36.9 C)  SpO2: 96% 99%    General: Awake, no distress.  CV:  Good peripheral perfusion.  Radial pulses equal and intact Resp:  Normal effort.  Sounds clear bilaterally Abd:  No distention.  Other:  Nontender chest wall.  No focal neurological deficits including motor function to arms and legs, facial asymmetry, pupils equal round and reactive.  She has some subjective occasional tingling sensation in the left arm associated with her chest pain.   ED Results / Procedures / Treatments   Labs (all labs ordered are listed, but only abnormal results are displayed) Labs Reviewed  BASIC METABOLIC PANEL - Abnormal; Notable for the  following components:      Result Value   Potassium 3.1 (*)    Glucose, Bld 105 (*)    All other components within normal limits  CBC  POC URINE PREG, ED  TROPONIN I (HIGH SENSITIVITY)     I reviewed labs and they are notable for hypokalemia 3.1, stable from prior.  Troponin nonelevated  EKG  ED ECG REPORT I, Pilar Jarvis, the attending physician, personally viewed and interpreted this ECG.   Date: 08/24/2022  EKG Time: 1333  Rate: 77  Rhythm: normal EKG, normal sinus rhythm, unchanged from previous tracings  Axis: normal  Intervals:none  ST&T Change: No ischemic changes    RADIOLOGY I independently reviewed and interpreted chest x-ray and see no focal opacity or pneumothorax   PROCEDURES:  Critical Care performed: No  Procedures   MEDICATIONS ORDERED IN ED: Medications  potassium chloride SA (KLOR-CON M) CR tablet 40 mEq (40 mEq Oral Given 08/24/22 1611)     IMPRESSION / MDM / ASSESSMENT AND PLAN / ED COURSE  I reviewed the triage vital signs and the nursing notes.  Differential diagnosis includes, but is not limited to, ACS, considered but less likely dissection, PE, respiratory infection    MDM: Patient with symptoms concerning for ACS given chest pain with radiation to left arm, though atypical and that is nonexertional, low risk factors for cardiac disease.  No tachycardia, hypoxia, shortness of breath less likely PE.  Pain atypical for dissection.  Troponin negative, low cardiac risk factors, onset 3 days ago.   She is stable at this time, will plan to follow-up with primary doctor and return for any worsening.  Patient's presentation is most consistent with acute complicated illness / injury requiring diagnostic workup.       FINAL CLINICAL IMPRESSION(S) / ED DIAGNOSES   Final diagnoses:  Atypical chest pain  Hypokalemia     Rx / DC Orders   ED Discharge Orders     None        Note:  This document was  prepared using Dragon voice recognition software and may include unintentional dictation errors.    Pilar Jarvis, MD 08/24/22 [phone]

## 2022-12-27 ENCOUNTER — Ambulatory Visit
Admission: EM | Admit: 2022-12-27 | Discharge: 2022-12-27 | Disposition: A | Discharge status: Home / Self Care | Payer: Medicaid Other | Attending: Emergency Medicine | Admitting: Emergency Medicine

## 2022-12-27 ENCOUNTER — Encounter: Payer: Self-pay | Admitting: Emergency Medicine

## 2022-12-27 DIAGNOSIS — Z1152 Encounter for screening for COVID-19: Secondary | ICD-10-CM | POA: Diagnosis not present

## 2022-12-27 DIAGNOSIS — R059 Cough, unspecified: Secondary | ICD-10-CM | POA: Diagnosis not present

## 2022-12-27 DIAGNOSIS — Z79899 Other long term (current) drug therapy: Secondary | ICD-10-CM | POA: Insufficient documentation

## 2022-12-27 DIAGNOSIS — J069 Acute upper respiratory infection, unspecified: Secondary | ICD-10-CM | POA: Diagnosis present

## 2022-12-27 LAB — SARS CORONAVIRUS 2 BY RT PCR: SARS Coronavirus 2 by RT PCR: NEGATIVE

## 2022-12-27 MED ORDER — LOPERAMIDE HCL 2 MG PO CAPS: 2.0000 mg | ORAL_CAPSULE | Freq: Four times a day (QID) | ORAL | 0 refills | Status: AC | PRN

## 2022-12-27 MED ORDER — ONDANSETRON 4 MG PO TBDP: 4.0000 mg | ORAL_TABLET | Freq: Three times a day (TID) | ORAL | 0 refills | Status: AC | PRN

## 2022-12-27 NOTE — ED Triage Notes (Signed)
Patient c/o cough, diarrhea, and vomitting that started 2 days ago.  Patient denies fevers.

## 2022-12-27 NOTE — ED Provider Notes (Signed)
[facility]    CSN: [id number] Arrival date & time: 12/27/22  0830      History   Chief Complaint Chief Complaint  Patient presents with   Cough   Diarrhea   Emesis    HPI Wendy Holmes is a 37 y.o. female.   Patient presents for evaluation of cough, nausea, vomiting, diarrhea and bodyaches beginning 2 days ago.  Last occurrence of vomiting this morning, emesis described as food and bile.  Last occurrence of diarrhea this morning, stool is described as watery.  Able to tolerate food and liquids.  Known sick contacts.  Has not attempted treatment.  Denies fever, chills, body aches, abdominal pain.     Past Medical History:  Diagnosis Date   Anxiety    Asthma    Bipolar 1 disorder (HCC)    CIN III (cervical intraepithelial neoplasia III)    Depression    History of LEEP (loop electrosurgical excision procedure) of cervix complicating pregnancy, second trimester    2012   Vaginal Pap smear, abnormal     Patient Active Problem List   Diagnosis Date Noted   Hepatitis B surface antigen positive 10/24/2020   Obesity BMI=34.1 08/10/2020   Smoker--last use 05/2020 08/10/2020   Cocaine abuse (HCC)--last use 2019 08/10/2020   Marijuana use 08/10/2020   H/O LEEP 09/2011 08/10/2020   Asthma 08/10/2020   Manic depression (HCC)/Bipolar dx'd age 55 04/21/2020   Bipolar 2 disorder (HCC) 04/21/2020    Past Surgical History:  Procedure Laterality Date   CHOLECYSTECTOMY     TONSILLECTOMY     TYMPANOSTOMY TUBE PLACEMENT     childhood   URETERAL STENT PLACEMENT      OB History     Gravida  2   Para  2   Term  2   Preterm  0   AB  0   Living  2      SAB  0   IAB  0   Ectopic  0   Multiple  0   Live Births  2            Home Medications    Prior to Admission medications   Medication Sig Start Date End Date Taking? Authorizing Provider  etonogestrel (NEXPLANON) 68 MG IMPL implant 1 each by Subdermal route once.   Yes [provider]  albuterol (PROVENTIL) (2.5 MG/3ML) 0.083% nebulizer solution Take 3 mLs (2.5 mg total) by nebulization every 6 (six) hours as needed for wheezing or shortness of breath. 05/07/20   Orvil Feil, PA-C  Lurasidone HCl 60 MG TABS Take by mouth.    [provider]  methylPREDNISolone (MEDROL DOSEPAK) 4 MG TBPK tablet Take 6 pills on day one then decrease by 1 pill each day 08/08/21   Faythe Ghee, PA-C  traMADol (ULTRAM) 50 MG tablet Take 1 tablet (50 mg total) by mouth every 6 (six) hours as needed. 08/08/21   Sherrie Mustache Roselyn Bering, PA-C    Family History History reviewed. No pertinent family history.  Social History Social History   Tobacco Use   Smoking status: Every Day    Types: Cigarettes    Last attempt to quit: 06/10/2020    Years since quitting: 2.5   Smokeless tobacco: Never  Vaping Use   Vaping Use: Never used  Substance Use Topics   Alcohol use: Yes    Alcohol/week: 3.0 standard drinks of alcohol    Types: 3 Standard drinks or equivalent per week  Comment: 1x/mo   Drug use: Not Currently    Types: Marijuana, Cocaine     Allergies   Patient has no known allergies.   Review of Systems Review of Systems  HENT: Negative.    Respiratory:  Positive for cough. Negative for apnea, choking, chest tightness, shortness of breath, wheezing and stridor.   Gastrointestinal:  Positive for diarrhea, nausea and vomiting. Negative for abdominal distention, abdominal pain, anal bleeding, blood in stool, constipation and rectal pain.  Musculoskeletal:  Positive for myalgias. Negative for arthralgias, back pain, gait problem, joint swelling, neck pain and neck stiffness.  Skin: Negative.      Physical Exam Triage Vital Signs ED Triage Vitals  Enc Vitals Group     BP 12/27/22 0845 127/75     Pulse Rate 12/27/22 0845 75     Resp 12/27/22 0845 14     Temp 12/27/22 0845 98.7 F (37.1 C)     Temp Source 12/27/22 0845 Oral     SpO2 12/27/22 0845 97 %      Weight 12/27/22 0841 199 lb 15.3 oz (90.7 kg)     Height 12/27/22 0841 5\' 4"  (1.626 m)     Head Circumference --      Peak Flow --      Pain Score 12/27/22 0841 6     Pain Loc --      Pain Edu? --      Excl. in [place]? --    No data found.  Updated Vital Signs BP 127/75 (BP Location: Left Arm)   Pulse 75   Temp 98.7 F (37.1 C) (Oral)   Resp 14   Ht 5\' 4"  (1.626 m)   Wt 199 lb 15.3 oz (90.7 kg)   SpO2 97%   BMI 34.32 kg/m   Visual Acuity Right Eye Distance:   Left Eye Distance:   Bilateral Distance:    Right Eye Near:   Left Eye Near:    Bilateral Near:     Physical Exam Constitutional:      Appearance: Normal appearance.  HENT:     Right Ear: Tympanic membrane, ear canal and external ear normal.     Left Ear: Tympanic membrane, ear canal and external ear normal.     Nose: Nose normal.     Mouth/Throat:     Mouth: Mucous membranes are moist.     Pharynx: Oropharynx is clear.  Eyes:     Extraocular Movements: Extraocular movements intact.  Cardiovascular:     Rate and Rhythm: Normal rate and regular rhythm.     Pulses: Normal pulses.     Heart sounds: Normal heart sounds.  Pulmonary:     Effort: Pulmonary effort is normal.     Breath sounds: Normal breath sounds.  Abdominal:     General: Abdomen is flat. Bowel sounds are increased.     Palpations: Abdomen is soft.     Tenderness: There is abdominal tenderness in the epigastric area and left lower quadrant.  Skin:    General: Skin is warm and dry.  Neurological:     Mental Status: She is alert and oriented to person, place, and time. Mental status is at baseline.  Psychiatric:        Mood and Affect: Mood normal.        Behavior: Behavior normal.    UC Treatments / Results  Labs (all labs ordered are listed, but only abnormal results are displayed) Labs Reviewed  SARS CORONAVIRUS 2 BY RT PCR  EKG   Radiology No results found.  Procedures Procedures (including critical care time)  Medications  Ordered in UC Medications - No data to display  Initial Impression / Assessment and Plan / UC Course  I have reviewed the triage vital signs and the nursing notes.  Pertinent labs & imaging results that were available during my care of the patient were reviewed by me and considered in my medical decision making (see chart for details).  Viral URI with cough  Patient is in no signs of distress nor toxic appearing.  Vital signs are stable.  Low suspicion for pneumonia, pneumothorax or bronchitis and therefore will defer imaging. COVID test is negative.Prescribed Zofran and Imodium. May use additional over-the-counter medications as needed for supportive care.  May follow-up with urgent care as needed if symptoms persist or worsen.   Final Clinical Impressions(s) / UC Diagnoses   Final diagnoses:  None   Discharge Instructions   None    ED Prescriptions   None    PDMP not reviewed this encounter.   Hans Eden, [place] 12/27/22 [phone]

## 2022-12-27 NOTE — Discharge Instructions (Signed)
Your symptoms today are most likely being caused by a virus and should steadily improve in time it can take up to 7 to 10 days before you truly start to see a turnaround however things will get better  COVID test negative  You may use Zofran every 8 hours to help calm vomiting  You may use Imodium every 6 hours to help slow diarrhea    You can take Tylenol and/or Ibuprofen as needed for fever reduction and pain relief.   For cough: honey 1/2 to 1 teaspoon (you can dilute the honey in water or another fluid).  You can also use guaifenesin and dextromethorphan for cough. You can use a humidifier for chest congestion and cough.  If you don't have a humidifier, you can sit in the bathroom with the hot shower running.      For sore throat: try warm salt water gargles, cepacol lozenges, throat spray, warm tea or water with lemon/honey, popsicles or ice, or OTC cold relief medicine for throat discomfort.   For congestion: take a daily anti-histamine like Zyrtec, Claritin, and a oral decongestant, such as pseudoephedrine.  You can also use Flonase 1-2 sprays in each nostril daily.   It is important to stay hydrated: drink plenty of fluids (water, gatorade/powerade/pedialyte, juices, or teas) to keep your throat moisturized and help further relieve irritation/discomfort.

## 2024-03-29 ENCOUNTER — Other Ambulatory Visit: Payer: Self-pay

## 2024-03-29 ENCOUNTER — Ambulatory Visit (INDEPENDENT_AMBULATORY_CARE_PROVIDER_SITE_OTHER): Admitting: Psychiatry

## 2024-03-29 ENCOUNTER — Encounter: Payer: Self-pay | Admitting: Psychiatry

## 2024-03-29 VITALS — BP 127/85 | HR 66 | Temp 97.7°F | Ht 64.0 in | Wt 157.2 lb

## 2024-03-29 DIAGNOSIS — F422 Mixed obsessional thoughts and acts: Secondary | ICD-10-CM

## 2024-03-29 DIAGNOSIS — F411 Generalized anxiety disorder: Secondary | ICD-10-CM

## 2024-03-29 DIAGNOSIS — F314 Bipolar disorder, current episode depressed, severe, without psychotic features: Secondary | ICD-10-CM | POA: Diagnosis not present

## 2024-03-29 MED ORDER — LAMOTRIGINE 25 MG PO TABS: 25.0000 mg | ORAL_TABLET | Freq: Every day | ORAL | 0 refills | Status: DC

## 2024-03-29 MED ORDER — LAMOTRIGINE 25 MG PO TABS
25.0000 mg | ORAL_TABLET | Freq: Every day | ORAL | 0 refills | Status: DC
Start: 2024-03-29 — End: 2024-03-29

## 2024-03-29 MED ORDER — HYDROXYZINE HCL 25 MG PO TABS: 25.0000 mg | ORAL_TABLET | Freq: Three times a day (TID) | ORAL | 0 refills | Status: DC | PRN

## 2024-03-29 NOTE — Addendum Note (Signed)
 Addended by: Danise Mina on: 03/29/2024 09:26 AM   Modules accepted: Orders
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
## 2024-04-13 ENCOUNTER — Ambulatory Visit (INDEPENDENT_AMBULATORY_CARE_PROVIDER_SITE_OTHER): Admitting: Psychiatry

## 2024-04-13 ENCOUNTER — Encounter: Payer: Self-pay | Admitting: Psychiatry

## 2024-04-13 VITALS — BP 118/82 | HR 68 | Temp 98.2°F | Ht 64.0 in | Wt 152.6 lb

## 2024-04-13 DIAGNOSIS — F411 Generalized anxiety disorder: Secondary | ICD-10-CM

## 2024-04-13 DIAGNOSIS — F422 Mixed obsessional thoughts and acts: Secondary | ICD-10-CM

## 2024-04-13 DIAGNOSIS — F314 Bipolar disorder, current episode depressed, severe, without psychotic features: Secondary | ICD-10-CM | POA: Diagnosis not present

## 2024-04-13 MED ORDER — LAMOTRIGINE 25 MG PO TABS: 50.0000 mg | ORAL_TABLET | Freq: Every day | ORAL | 0 refills | Status: DC

## 2024-04-13 MED ORDER — HYDROXYZINE HCL 25 MG PO TABS: 25.0000 mg | ORAL_TABLET | Freq: Three times a day (TID) | ORAL | 2 refills | Status: DC

## 2024-04-13 NOTE — Progress Notes (Addendum)
 BH MD/PA/NP OP Progress Note  04/13/2024 8:19 AM Wendy Holmes  MRN:  [id number]  Chief Complaint: "I'm feeling better, I've been out fishing 4 times now"  HPI: 38 year old female here for appointment for follow-up at [facility]  Patient reports that she is noticing an improvement she is not as tearful as her initial presentation reporting that ever since starting the medication she has not been as tearful.  Patient still appears fidgety and anxious reporting that she has been trying to do better for herself by applying for jobs but peoples keeps thinking that she is too fidgety.  Patient endorses she can tell the difference of the medication and states that she felt better since last being seen here for initial appointment in which she was able to get out and do more by spending time with her family over Easter as well as getting out to fish more and to do chores with her chickens.  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  Patient reports that she has been trying to reconcile with her daughter in which she is feeling more confident in herself and be able to communicate with her family instead of pushing them away.  Patient still endorses that she wants to go get autism testing as well and therapy.  Based on patient's presentation patient would benefit greatly from DBT style therapy and is recommended to see Nellie Banas here at [facility] office in which she was placed on the wait list.  Patient reports no side effects or adverse reactions from Lamictal .  Patient also reports that she has been taking hydroxyzine  at least once every other day and states that it does help with controlling her anxiety and spiraling.  Patient states that the medication is effective and she wants to continue.  Patient does endorse that she is having irritability towards sounds and was not like this before medication.   Patient was educated that we are going at seek the therapeutic dose for Lamictal  and consider other possibilities once medications at therapeutic level.  Patient will increase Lamictal  to 50 mg once daily patient has been reeducated on this side effects and adverse reactions and to call the clinic should she develop a rash and to stop the medication.  Patient will be recommended for therapy with Nellie Banas at the [facility] office.  Patient also will follow up in 2 weeks. Visit Diagnosis:    ICD-10-CM   1. Bipolar disorder with severe depression (HCC)  F31.4     2. Generalized anxiety disorder  F41.1     3. Mixed obsessional thoughts and acts  F42.2       Past Psychiatric History:  Previous Psych Hospitalizations: Denies   Outpatient treatment: Seen a psychiatrist in 2017, unable to recall the name, currently being manages with primary care with [facility].   Medications Current: albuteral inhaler for asthma.   Medication Trials: - Latuda 40mg , side effects, stopped taking after 8 months, nausea vomiting - Seroquel - 2013, took 2 doses after waking up after taking medications and becoming violent. - Klonopin - 2018, took too many doses in one day, and then encountered stressors and attempted to take a overdose to commit suicide, in which she did not go to the ER, but she reports she took a handful.    Suicide & Violence: - Previous suicide attempt in 2018, did not go  to the hospital.   Psychotherapy: Not currently participating in therapy.     Legal: Denies  Past Medical History:  Past Medical History:  Diagnosis Date   Anxiety    Asthma    Bipolar 1 disorder (HCC)    CIN III (cervical intraepithelial neoplasia III)    Depression    History of LEEP (loop electrosurgical excision procedure) of cervix complicating pregnancy, second trimester    2012   Vaginal Pap smear, abnormal     Past Surgical History:  Procedure Laterality Date   CHOLECYSTECTOMY     TONSILLECTOMY      TYMPANOSTOMY TUBE PLACEMENT     childhood   URETERAL STENT PLACEMENT      Family Psychiatric History: Denies any biological history from her mother and father. Reports nephew has autism.   Family History: No family history on file.  Social History:  Social History   Socioeconomic History   Marital status: Divorced    Spouse name: Not on file   Number of children: 2   Years of education: 12   Highest education level: Some college, no degree  Occupational History   Not on file  Tobacco Use   Smoking status: Former    Current packs/day: 0.00    Types: Cigarettes    Quit date: 06/10/2020    Years since quitting: 3.8   Smokeless tobacco: Never  Vaping Use   Vaping status: Never Used  Substance and Sexual Activity   Alcohol use: Not Currently    Alcohol/week: 3.0 standard drinks of alcohol    Types: 3 Standard drinks or equivalent per week   Drug use: Yes    Types: Marijuana, Cocaine   Sexual activity: Yes    Partners: Male    Birth control/protection: Implant  Other Topics Concern   Not on file  Social History Narrative   Not on file   Social Drivers of Health   Financial Resource Strain: Not on file  Food Insecurity: Not on file  Transportation Needs: Not on file  Physical Activity: Not on file  Stress: Not on file  Social Connections: Not on file    Allergies: No Known Allergies  Metabolic Disorder Labs: No results found for: "HGBA1C", "MPG" No results found for: "PROLACTIN" No results found for: "CHOL", "TRIG", "HDL", "CHOLHDL", "VLDL", "LDLCALC" Lab Results  Component Value Date   TSH 2.16 08/13/2012    Therapeutic Level Labs: No results found for: "LITHIUM" No results found for: "VALPROATE" No results found for: "CBMZ"  Current Medications: Current Outpatient Medications  Medication Sig Dispense Refill   albuterol  (PROVENTIL ) (2.5 MG/3ML) 0.083% nebulizer solution Take 3 mLs (2.5 mg total) by nebulization every 6 (six) hours as needed for  wheezing or shortness of breath. 75 mL 12   etonogestrel  (NEXPLANON ) 68 MG IMPL implant 1 each by Subdermal route once.     hydrOXYzine  (ATARAX ) 25 MG tablet Take 1 tablet (25 mg total) by mouth 3 (three) times daily as needed. 30 tablet 0   lamoTRIgine  (LAMICTAL ) 25 MG tablet Take 1 tablet (25 mg total) by mouth daily. 14 tablet 0   loperamide  (IMODIUM ) 2 MG capsule Take 1 capsule (2 mg total) by mouth 4 (four) times daily as needed for diarrhea or loose stools. (Patient not taking: Reported on 03/29/2024) 12 capsule 0   methylPREDNISolone  (MEDROL  DOSEPAK) 4 MG TBPK tablet Take 6 pills on day one then decrease by 1 pill each day (Patient not taking: Reported on 03/29/2024) 21 tablet 0  ondansetron  (ZOFRAN -ODT) 4 MG disintegrating tablet Take 1 tablet (4 mg total) by mouth every 8 (eight) hours as needed for nausea or vomiting. (Patient not taking: Reported on 03/29/2024) 20 tablet 0   traMADol  (ULTRAM ) 50 MG tablet Take 1 tablet (50 mg total) by mouth every 6 (six) hours as needed. (Patient not taking: Reported on 03/29/2024) 15 tablet 0   No current facility-administered medications for this visit.     Musculoskeletal: Strength & Muscle Tone: within normal limits Gait & Station: normal Patient leans: N/A  Psychiatric Specialty Exam: Review of Systems  Constitutional: Negative.   HENT: Negative.    Eyes: Negative.   Respiratory: Negative.    Cardiovascular: Negative.   Gastrointestinal: Negative.   Endocrine: Negative.   Genitourinary: Negative.   Musculoskeletal: Negative.   Skin: Negative.   Allergic/Immunologic: Negative.   Neurological: Negative.   Hematological: Negative.   Psychiatric/Behavioral:  The patient is nervous/anxious.     There were no vitals taken for this visit.There is no height or weight on file to calculate BMI.  General Appearance: Well Groomed  Eye Contact:  Good  Speech:  Clear and Coherent  Volume:  Normal  Mood:  Anxious and Depressed  Affect:  Depressed   Thought Process:  Coherent  Orientation:  Full (Time, Place, and Person)  Thought Content: Logical   Suicidal Thoughts:  No  Homicidal Thoughts:  No  Memory:  Immediate;   Good Recent;   Good Remote;   Good  Judgement:  Good  Insight:  Good  Psychomotor Activity:  Normal  Concentration:  Concentration: Good and Attention Span: Good  Recall:  Good  Fund of Knowledge: Good  Language: Good  Akathisia:  No  Handed:  Right  AIMS (if indicated):   Assets:  Desire for Improvement Physical Health Social Support  ADL's:  Intact  Cognition: WNL  Sleep:  Good   Screenings: PHQ2-9    Flowsheet Row Office Visit from 03/29/2024 in [facility] Office Visit from 08/10/2020 in [facility]  PHQ-2 Total Score 6 0  PHQ-9 Total Score 25 --      Flowsheet Row Office Visit from 03/29/2024 in [facility] ED from 12/27/2022 in [facility]  ED from 08/24/2022 in [facility] Emergency Department at [facility]  C-SSRS RISK CATEGORY Low Risk No Risk No Risk        Assessment and Plan:   Assessment and Plan:    Assessment - Diagnosis: Bipolar disorder with severe depression (HCC) [F31.4]  2. Generalized anxiety disorder [F41.1]  3. Mixed obsessional thoughts and acts [F42.2]  Differential Diagnosis: Autism, referred to testing.  - Progress: Follow-up visit, pt still appears anxious - Risk Factors: Suicidal risk, worsening symptoms,   Plan - Medications:  Increase Lamictal  to 50 mg by mouth once daily for 2 weeks.  Patient was educated on the importance of monitoring for rashes and to stop taking medication should a rash develop and to contact the clinic.  Patient also reported nausea and vomiting as well as headache and sedation advised to take at night as well as to wait for medication on the symptoms, please notify clinic if symptoms last longer than 1  week. Continue hydroxyzine  25 mg by mouth 3 times a day as needed for anxiety.  Patient educated on the signs sedation of medications and advised not to take the medication and drive.  Patient also advised to notify the  clinic should she take the medication 3 times a day for consecutive days. - Psychotherapy: Patient provided a psychotherapy referral list. Pl - Education: Patient educated on importance of monitoring for rationing with Lamictal .  Patient educated on suicide risk in which she agrees that she will call 911 or go to emergency department should she have suicidal thoughts with or without a plan.  To educate on behaviors and to continue journaling and monitoring of her behaviors regarding texting which she states has been pushing people away.  Advised to follow the recommendation and treatment plan of the therapists while working around these OCD tendencies and behaviors. - Follow-Up: Patient will follow up in 2 weeks - Referrals: Therapy referral was provided. - Safety Planning: Patient was educated on her depression and to notify the clinic should she have worsening symptoms.  Patient also notified that should she have suicidal ideation with or without a plan to call 911 or go to the closest emergency department.  Patient denies having a firearm within the home.  Patient/Guardian was advised Release of Information must be obtained prior to any record release in order to collaborate their care with an outside provider. Patient/Guardian was advised if they have not already done so to contact the registration department to sign all necessary forms in order for us  to release information regarding their care.   Consent: Patient/Guardian gives verbal consent for treatment and assignment of benefits for services provided during this visit. Patient/Guardian expressed understanding and agreed to proceed.   This office note has been dictated. This dictation was prepared using Tree [employer]. As a result, errors may occur. When identified, these errors have been corrected. While every attempt is made to correct errors during dictation, errors may still exist.   Arlana Labor, NP 04/13/2024, 8:19 AM

## 2024-04-29 ENCOUNTER — Ambulatory Visit (INDEPENDENT_AMBULATORY_CARE_PROVIDER_SITE_OTHER): Admitting: Psychiatry

## 2024-04-29 ENCOUNTER — Encounter: Payer: Self-pay | Admitting: Psychiatry

## 2024-04-29 VITALS — BP 128/86 | HR 93 | Temp 99.2°F | Ht 64.25 in | Wt 157.0 lb

## 2024-04-29 DIAGNOSIS — R443 Hallucinations, unspecified: Secondary | ICD-10-CM | POA: Diagnosis not present

## 2024-04-29 DIAGNOSIS — F411 Generalized anxiety disorder: Secondary | ICD-10-CM | POA: Diagnosis not present

## 2024-04-29 DIAGNOSIS — F422 Mixed obsessional thoughts and acts: Secondary | ICD-10-CM | POA: Diagnosis not present

## 2024-04-29 DIAGNOSIS — F314 Bipolar disorder, current episode depressed, severe, without psychotic features: Secondary | ICD-10-CM | POA: Diagnosis not present

## 2024-04-29 MED ORDER — LAMOTRIGINE 100 MG PO TABS: 100.0000 mg | ORAL_TABLET | Freq: Every day | ORAL | 0 refills | Status: DC

## 2024-04-29 MED ORDER — OLANZAPINE 5 MG PO TABS: 5.0000 mg | ORAL_TABLET | Freq: Every day | ORAL | 0 refills | Status: DC

## 2024-04-29 NOTE — Progress Notes (Addendum)
 BH MD/PA/NP OP Progress Note  04/29/2024 9:10 AM Wendy Holmes  MRN:  [id number]  Chief Complaint: " I can't get a job and my daughter is not talking to me"  HPI: 38 year old female presenting to [facility] for follow-up.  Patient presents to the appointment tearful and crying stating that she was originally supposed to be coming to the appointment with her daughter but her daughter refused stating that she does not feel like going out today.  Patient took this as "abandonment" and states that this has been a big stressor and she almost did not show for the appointment today.  Patient reports that she has been taking the medication as prescribed and reports that she is seeing slight improvements but states that she is still having to get around these emotions of abandonment from her family which she has been taking a hard within the last 2 weeks.  Patient reports that she is also having difficulty finding a job reporting that the job was accepted but then she rejected the job after the unborn process started at home in which she felt overwhelmed.  Patient asked if she could go on short-term disability in which it weeks is explained to her that [employer] office does not provide disability notices unless seen by our office for 6 months.  Patient verbalized agreement understanding of this policy.  Patient reports that she continues to having uncontrollable crying due to the situational stressors between her daughter as well as her family but reports that she is feeling more in control in regards of her compulsions of wanting to text at extreme amounts with her family.  Though she reports that she is now having auditory hallucinations in which her dishwasher and her laundry machine seem to be on but it is not on.  Patient reports that the family members have also reported of not hearing sounds that she is hearing stating that she can hear TV clicking at night and it keeps her up and preventing her from sleeping.  Patient was  encouraged to continue medications as well as behavior modification to remind self that she is responsible for her actions and statements and that she cannot control what other people say and to be more understanding in which if people do not want to join her like today with her daughter to find a replacement method of going to either [employer] or keeping herself busy with her chores at home or her life stuck in which she cares for chickens.  Based on this assessment interview is recommended for the patient to continue medications with increasing Lamictal  100 mg due to being tolerated as well as seeing slight improvements, patient will also continue hydroxyzine  25 mg 3 times a day as needed for anxiety.  Due to the complaint of hallucinations auditory and visual, patient is reporting visual hallucinations of which the ground feels like he is getting closer and seeing the lines and cuts in people's faces, and auditory hallucinations in which she is hearing the TV clicking as well as machines on when they are not in the middle of the night.  It is recommended for the patient to start on Zyprexa 5 mg once a day at night before bed.  Patient will also be follow up within a week due to patient's presentation as well as severity of symptoms.  Patient denies SI, HI.  Patient was also encouraged to look for a therapist or [facility], or any other online therapist database that accepts Medicaid while  she waits for a therapist here at [facility].  Patient verbalized agreement understanding and patient states she is glad that she will be meeting weekly.  Visit Diagnosis:    ICD-10-CM   1. Bipolar disorder with severe depression (HCC)  F31.4 lamoTRIgine  (LAMICTAL ) 100 MG tablet    2. Generalized anxiety disorder  F41.1     3. Mixed obsessional thoughts and acts  F42.2     4. Hallucinations  R44.3 OLANZapine (ZYPREXA) 5 MG tablet      Past Psychiatric History:  Previous Psych Hospitalizations: Denies    Outpatient treatment: Seen a psychiatrist in 2017, unable to recall the name, currently being manages with primary care with [facility].   Medications Current: -Lamictal  100mg  once nightly, for bipolar depression -Hydroxyzine  25mg  TID as needed for anxiety -Zyprexa 5mg  once a day before bed, for hallucinations.   Medication Trials: - Latuda 40mg , side effects, stopped taking after 8 months, nausea vomiting - Seroquel - 2013, took 2 doses after waking up after taking medications and becoming violent. - Klonopin - 2018, took too many doses in one day, and then encountered stressors and attempted to take a overdose to commit suicide, in which she did not go to the ER, but she reports she took a handful.    Suicide & Violence: - Previous suicide attempt in 2018, did not go to the hospital.   Psychotherapy: Not currently participating in therapy.     Legal: Denies  Past Medical History:  Past Medical History:  Diagnosis Date   Anxiety    Asthma    Bipolar 1 disorder (HCC)    CIN III (cervical intraepithelial neoplasia III)    Depression    History of LEEP (loop electrosurgical excision procedure) of cervix complicating pregnancy, second trimester    2012   Vaginal Pap smear, abnormal     Past Surgical History:  Procedure Laterality Date   CHOLECYSTECTOMY     TONSILLECTOMY     TYMPANOSTOMY TUBE PLACEMENT     childhood   URETERAL STENT PLACEMENT      Family Psychiatric History: Denies any biological history from her mother and father. Reports nephew has autism.   Family History: No family history on file.  Social History:  Social History   Socioeconomic History   Marital status: Divorced    Spouse name: Not on file   Number of children: 2   Years of education: 12   Highest education level: Some college, no degree  Occupational History   Not on file  Tobacco Use   Smoking status: Former    Current packs/day: 0.00    Types: Cigarettes    Quit date: 06/10/2020     Years since quitting: 3.8   Smokeless tobacco: Never  Vaping Use   Vaping status: Never Used  Substance and Sexual Activity   Alcohol use: Not Currently    Alcohol/week: 3.0 standard drinks of alcohol    Types: 3 Standard drinks or equivalent per week   Drug use: Not Currently    Types: Marijuana, Cocaine   Sexual activity: Not Currently    Partners: Male    Birth control/protection: Implant  Other Topics Concern   Not on file  Social History Narrative   Not on file   Social Drivers of Health   Financial Resource Strain: Not on file  Food Insecurity: Not on file  Transportation Needs: Not on file  Physical Activity: Not on file  Stress: Not on file  Social Connections: Not on file  Allergies: No Known Allergies  Metabolic Disorder Labs: No results found for: "HGBA1C", "MPG" No results found for: "PROLACTIN" No results found for: "CHOL", "TRIG", "HDL", "CHOLHDL", "VLDL", "LDLCALC" Lab Results  Component Value Date   TSH 2.16 08/13/2012    Therapeutic Level Labs: No results found for: "LITHIUM" No results found for: "VALPROATE" No results found for: "CBMZ"  Current Medications: Current Outpatient Medications  Medication Sig Dispense Refill   albuterol  (PROVENTIL ) (2.5 MG/3ML) 0.083% nebulizer solution Take 3 mLs (2.5 mg total) by nebulization every 6 (six) hours as needed for wheezing or shortness of breath. 75 mL 12   etonogestrel  (NEXPLANON ) 68 MG IMPL implant 1 each by Subdermal route once.     hydrOXYzine  (ATARAX ) 25 MG tablet Take 1 tablet (25 mg total) by mouth 3 (three) times daily. 30 tablet 2   lamoTRIgine  (LAMICTAL ) 25 MG tablet Take 2 tablets (50 mg total) by mouth daily. 60 tablet 0   loperamide  (IMODIUM ) 2 MG capsule Take 1 capsule (2 mg total) by mouth 4 (four) times daily as needed for diarrhea or loose stools. (Patient not taking: Reported on 04/29/2024) 12 capsule 0   methylPREDNISolone  (MEDROL  DOSEPAK) 4 MG TBPK tablet Take 6 pills on day one then  decrease by 1 pill each day 21 tablet 0   ondansetron  (ZOFRAN -ODT) 4 MG disintegrating tablet Take 1 tablet (4 mg total) by mouth every 8 (eight) hours as needed for nausea or vomiting. 20 tablet 0   traMADol  (ULTRAM ) 50 MG tablet Take 1 tablet (50 mg total) by mouth every 6 (six) hours as needed. (Patient not taking: Reported on 04/29/2024) 15 tablet 0   No current facility-administered medications for this visit.     Musculoskeletal: Strength & Muscle Tone: within normal limits Gait & Station: normal Patient leans: N/A  Psychiatric Specialty Exam: Review of Systems  Constitutional: Negative.   HENT: Negative.    Eyes: Negative.   Respiratory: Negative.    Cardiovascular: Negative.   Gastrointestinal: Negative.   Endocrine: Negative.   Genitourinary: Negative.   Musculoskeletal: Negative.   Skin: Negative.   Allergic/Immunologic: Negative.   Neurological: Negative.   Hematological: Negative.   Psychiatric/Behavioral:  Positive for behavioral problems and dysphoric mood. The patient is nervous/anxious.     Blood pressure 128/86, pulse 93, temperature 99.2 F (37.3 C), height 5' 4.25" (1.632 m), weight 157 lb (71.2 kg), SpO2 100%.Body mass index is 26.74 kg/m.  General Appearance: Well Groomed  Eye Contact:  Good  Speech:  Clear and Coherent  Volume:  Increased  Mood:  Anxious  Affect:  Tearful  Thought Process:  Coherent  Orientation:  Full (Time, Place, and Person)  Thought Content: Logical   Suicidal Thoughts:  No  Homicidal Thoughts:  No  Memory:  Immediate;   Good Recent;   Good Remote;   Good  Judgement:  Good  Insight:  Good  Psychomotor Activity:  Normal  Concentration:  Concentration: Good and Attention Span: Good  Recall:  Good  Fund of Knowledge: Good  Language: Good  Akathisia:  No  Handed:  Right  AIMS (if indicated):   Assets:  Financial Resources/Insurance Housing Social Support  ADL's:  Intact  Cognition: WNL  Sleep:  Good    Screenings: GAD-7    Loss adjuster, chartered Office Visit from 04/29/2024 in [facility]  Total GAD-7 Score 21      PHQ2-9    Flowsheet Row Office Visit from 04/29/2024 in [facility]  Office Visit from 04/13/2024 in [facility] Office Visit from 03/29/2024 in [facility] Office Visit from 08/10/2020 in [facility]  PHQ-2 Total Score 6 4 6  0  PHQ-9 Total Score 26 20 25  --      Flowsheet Row Office Visit from 04/29/2024 in [facility] Office Visit from 04/13/2024 in [facility] Office Visit from 03/29/2024 in [facility]  C-SSRS RISK CATEGORY Error: Q3, 4, or 5 should not be populated when Q2 is No Low Risk Low Risk        Assessment and Plan:   Assessment - Diagnosis: Bipolar disorder with severe depression (HCC) [F31.4]  2. Generalized anxiety disorder [F41.1]  3. Mixed obsessional thoughts and acts [F42.2]  4. Hallucinations [R44.3]   Differential Diagnosis: Borderline Personality Disorder, recurrent abandonment   - Progress: Follow-up visit, pt still appears anxious - Risk Factors: Suicidal risk, worsening symptoms  Plan - Medications:  Increase Lamictal  to 100 mg by mouth once daily for 2 weeks.  Patient was educated on the importance of monitoring for rashes and to stop taking medication should a rash develop and to contact the clinic.  Patient also reported nausea and vomiting as well as headache and sedation advised to take at night as well as to wait for medication on the symptoms, please notify clinic if symptoms last longer than 1 week. Start Zyprexa 5mg  once a day at night for hallucination and sleep.  Pt reports having auditory and visual hallucinations. Pt educated about Tardive  dyskinesia and to monitor for new abnormal or uncontrollable movements.    Continue hydroxyzine  25 mg by mouth 3 times a day as needed for anxiety.  Patient educated on the signs sedation of medications and advised not to take the medication and drive.  Patient also advised to notify the clinic should she take the medication 3 times a day for consecutive days. - Psychotherapy: Patient provided a psychotherapy referral list. Pl - Education: Patient educated on importance of monitoring for rationing with Lamictal .  Patient educated on suicide risk in which she agrees that she will call 911 or go to emergency department should she have suicidal thoughts with or without a plan.  To educate on behaviors and to continue journaling and monitoring of her behaviors regarding texting which she states has been pushing people away.  Advised to follow the recommendation and treatment plan of the therapists while working around these OCD tendencies and behaviors. - Follow-Up: Patient will follow up in 2 weeks - Referrals: Therapy referral was provided. - Safety Planning: Patient was educated on her depression and to notify the clinic should she have worsening symptoms.  Patient also notified that should she have suicidal ideation with or without a plan to call 911 or go to the closest emergency department.  Patient denies having a firearm within the home.   Patient/Guardian was advised Release of Information must be obtained prior to any record release in order to collaborate their care with an outside provider. Patient/Guardian was advised if they have not already done so to contact the registration department to sign all necessary forms in order for us  to release information regarding their care.   Consent: Patient/Guardian gives verbal consent for treatment and assignment of benefits for services provided during this visit. Patient/Guardian expressed understanding and agreed to proceed.    Arlana Labor,  NP 04/29/2024, 9:10 AM

## 2024-04-30 ENCOUNTER — Telehealth: Payer: Self-pay

## 2024-04-30 NOTE — Telephone Encounter (Signed)
 Received fax that a prior auth was needed on the olanzapine 

## 2024-04-30 NOTE — Telephone Encounter (Signed)
 went online and submitted the prior auth - pending

## 2024-05-03 NOTE — Telephone Encounter (Signed)
 prior [employer] was approved from 04-30-24 to 04-30-25

## 2024-05-06 ENCOUNTER — Ambulatory Visit: Admitting: Psychiatry

## 2024-05-07 ENCOUNTER — Ambulatory Visit: Payer: Self-pay

## 2024-05-11 ENCOUNTER — Encounter: Payer: Self-pay | Admitting: Psychiatry

## 2024-05-11 ENCOUNTER — Ambulatory Visit (INDEPENDENT_AMBULATORY_CARE_PROVIDER_SITE_OTHER): Admitting: Psychiatry

## 2024-05-11 ENCOUNTER — Other Ambulatory Visit: Payer: Self-pay

## 2024-05-11 VITALS — BP 124/87 | HR 64 | Temp 97.5°F | Ht 64.25 in | Wt 153.8 lb

## 2024-05-11 DIAGNOSIS — R443 Hallucinations, unspecified: Secondary | ICD-10-CM | POA: Diagnosis not present

## 2024-05-11 DIAGNOSIS — F422 Mixed obsessional thoughts and acts: Secondary | ICD-10-CM

## 2024-05-11 DIAGNOSIS — F314 Bipolar disorder, current episode depressed, severe, without psychotic features: Secondary | ICD-10-CM

## 2024-05-11 DIAGNOSIS — F411 Generalized anxiety disorder: Secondary | ICD-10-CM | POA: Diagnosis not present

## 2024-05-11 MED ORDER — LAMOTRIGINE 200 MG PO TABS: 200.0000 mg | ORAL_TABLET | Freq: Every day | ORAL | 2 refills | Status: DC

## 2024-05-11 MED ORDER — OLANZAPINE 7.5 MG PO TABS: 7.5000 mg | ORAL_TABLET | Freq: Every day | ORAL | 1 refills | Status: DC

## 2024-05-11 NOTE — Progress Notes (Signed)
 BH MD/PA/NP OP Progress Note  05/11/2024 9:12 AM Wendy Holmes  MRN:  [id number]  Chief Complaint:  Chief Complaint  Patient presents with   Follow-up   HPI: 38 year old female present to [facility] for follow-up.  Patient presents to the appointment euthymic as well as not tearful as previous appointments.  Patient reports that she is coping much better with her symptoms as well as sleeping better and feeling a decrease in hallucinations.  Patient still endorses having some hallucinations reporting that she can tell noticeable difference.  Patient reports that this week she feels that she is better able to tolerate for disappointment as well as when things do not go her way.  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  Patient seems more optimistic this week and stating that her mood has greatly improved and that she is able to tolerate stress much better.  Patient also reports that she continues to seek for opportunities to reconcile with her daughter as well as finding joy in taking care of her son.  She is reporting that the medications are helping as well as patiently waiting for therapy in which she is on the wait list for Nellie Banas here at [facility].  Based on this assessment interview patient will continue to follow up next week and will increase her Lamictal  to 200 mg once daily.  Patient will also continue taking hydroxyzine  3 times a day as needed for anxiety with the reporting only having to take one dose within the last week.  Patient also will be considered to increase her Zyprexa  to 7.5 mg due to hallucinations she is still experiencing auditory.  Patient will monitor for symptoms throughout the weekend report to the provider should these symptoms improved.  Patient's neck steps are to monitor for anxiety symptoms and to consider anxiety management.  Patient agrees to treatment plan.  Patient denies  SI, HI, AVH.  Patient to follow up in 1 week Visit Diagnosis:    ICD-10-CM   1. Bipolar disorder with severe depression (HCC)  F31.4 Comprehensive metabolic panel with GFR    CBC    Hemoglobin A1C    lamoTRIgine  (LAMICTAL ) 200 MG tablet    2. Generalized anxiety disorder  F41.1     3. Mixed obsessional thoughts and acts  F42.2     4. Hallucinations  R44.3 OLANZapine  (ZYPREXA ) 7.5 MG tablet      Past Psychiatric History:  Previous Psych Hospitalizations: Denies   Outpatient treatment: Seen a psychiatrist in 2017, unable to recall the name, currently being manages with primary care with [facility].   Medications Current: -Lamictal  200mg  once nightly, for bipolar depression -Hydroxyzine  25mg  TID as needed for anxiety -Zyprexa  7.5mg  once a day before bed, for hallucinations.    Medication Trials: - Latuda 40mg , side effects, stopped taking after 8 months, nausea vomiting - Seroquel - 2013, took 2 doses after waking up after taking medications and becoming violent. - Klonopin - 2018, took too many doses in one day, and then encountered stressors and attempted to take a overdose to commit suicide, in which she did not go to the ER, but she reports she took a handful.    Suicide & Violence: - Previous suicide attempt in 2018, did not go to the hospital.   Psychotherapy: Not currently participating in therapy.     Legal: Denies  Past Medical History:  Past  Medical History:  Diagnosis Date   Anxiety    Asthma    Bipolar 1 disorder (HCC)    CIN III (cervical intraepithelial neoplasia III)    Depression    History of LEEP (loop electrosurgical excision procedure) of cervix complicating pregnancy, second trimester    2012   Vaginal Pap smear, abnormal     Past Surgical History:  Procedure Laterality Date   CHOLECYSTECTOMY     TONSILLECTOMY     TYMPANOSTOMY TUBE PLACEMENT     childhood   URETERAL STENT PLACEMENT      Family Psychiatric History: Denies any biological  history from her mother and father. Reports nephew has autism.     Family History: History reviewed. No pertinent family history.  Social History:  Social History   Socioeconomic History   Marital status: Divorced    Spouse name: Not on file   Number of children: 2   Years of education: 12   Highest education level: Some college, no degree  Occupational History   Not on file  Tobacco Use   Smoking status: Former    Current packs/day: 0.00    Types: Cigarettes    Quit date: 06/10/2020    Years since quitting: 3.9   Smokeless tobacco: Never  Vaping Use   Vaping status: Never Used  Substance and Sexual Activity   Alcohol use: Not Currently    Alcohol/week: 3.0 standard drinks of alcohol    Types: 3 Standard drinks or equivalent per week   Drug use: Not Currently    Types: Marijuana, Cocaine   Sexual activity: Not Currently    Partners: Male    Birth control/protection: Implant  Other Topics Concern   Not on file  Social History Narrative   Not on file   Social Drivers of Health   Financial Resource Strain: Not on file  Food Insecurity: Not on file  Transportation Needs: Not on file  Physical Activity: Not on file  Stress: Not on file  Social Connections: Not on file    Allergies: No Known Allergies  Metabolic Disorder Labs: No results found for: "HGBA1C", "MPG" No results found for: "PROLACTIN" No results found for: "CHOL", "TRIG", "HDL", "CHOLHDL", "VLDL", "LDLCALC" Lab Results  Component Value Date   TSH 2.16 08/13/2012    Therapeutic Level Labs: No results found for: "LITHIUM" No results found for: "VALPROATE" No results found for: "CBMZ"  Current Medications: Current Outpatient Medications  Medication Sig Dispense Refill   albuterol  (PROVENTIL ) (2.5 MG/3ML) 0.083% nebulizer solution Take 3 mLs (2.5 mg total) by nebulization every 6 (six) hours as needed for wheezing or shortness of breath. 75 mL 12   etonogestrel  (NEXPLANON ) 68 MG IMPL implant 1  each by Subdermal route once.     hydrOXYzine  (ATARAX ) 25 MG tablet Take 1 tablet (25 mg total) by mouth 3 (three) times daily. 30 tablet 2   lamoTRIgine  (LAMICTAL ) 100 MG tablet Take 1 tablet (100 mg total) by mouth daily. 30 tablet 0   loperamide  (IMODIUM ) 2 MG capsule Take 1 capsule (2 mg total) by mouth 4 (four) times daily as needed for diarrhea or loose stools. 12 capsule 0   methylPREDNISolone  (MEDROL  DOSEPAK) 4 MG TBPK tablet Take 6 pills on day one then decrease by 1 pill each day 21 tablet 0   OLANZapine  (ZYPREXA ) 5 MG tablet Take 1 tablet (5 mg total) by mouth at bedtime. 30 tablet 0   ondansetron  (ZOFRAN -ODT) 4 MG disintegrating tablet Take 1 tablet (4 mg total)  by mouth every 8 (eight) hours as needed for nausea or vomiting. 20 tablet 0   traMADol  (ULTRAM ) 50 MG tablet Take 1 tablet (50 mg total) by mouth every 6 (six) hours as needed. 15 tablet 0   No current facility-administered medications for this visit.     Musculoskeletal: Strength & Muscle Tone: within normal limits Gait & Station: normal Patient leans: N/A  Psychiatric Specialty Exam: Review of Systems  Constitutional: Negative.   HENT: Negative.    Eyes: Negative.   Respiratory: Negative.    Cardiovascular: Negative.   Gastrointestinal: Negative.   Endocrine: Negative.   Genitourinary: Negative.   Musculoskeletal: Negative.   Allergic/Immunologic: Negative.   Neurological: Negative.   Hematological: Negative.   Psychiatric/Behavioral:  Positive for dysphoric mood. The patient is nervous/anxious.     Blood pressure 124/87, pulse 64, temperature (!) 97.5 F (36.4 C), temperature source Temporal, height 5' 4.25" (1.632 m), weight 153 lb 12.8 oz (69.8 kg).Body mass index is 26.19 kg/m.  General Appearance: Well Groomed  Eye Contact:  Good  Speech:  Clear and Coherent  Volume:  Normal  Mood:  Anxious  Affect:  Appropriate  Thought Process:  Coherent  Orientation:  Full (Time, Place, and Person)  Thought  Content: Logical   Suicidal Thoughts:  No  Homicidal Thoughts:  No  Memory:  Immediate;   Good Recent;   Good Remote;   Good  Judgement:  Good  Insight:  Good  Psychomotor Activity:  Normal  Concentration:  Concentration: Good and Attention Span: Good  Recall:  Good  Fund of Knowledge: Good  Language: Good  Akathisia:  No  Handed:  Right  AIMS (if indicated):   Assets:  Desire for Improvement Financial Resources/Insurance Housing  ADL's:  Intact  Cognition: WNL  Sleep:  Good   Screenings: GAD-7    Flowsheet Row Office Visit from 04/29/2024 in [facility]  Total GAD-7 Score 21      PHQ2-9    Flowsheet Row Office Visit from 04/29/2024 in [facility] Office Visit from 04/13/2024 in [facility] Office Visit from 03/29/2024 in [facility] Office Visit from 08/10/2020 in [facility]  PHQ-2 Total Score 6 4 6  0  PHQ-9 Total Score 26 20 25  --      Flowsheet Row Office Visit from 04/29/2024 in [facility] Office Visit from 04/13/2024 in [facility] Office Visit from 03/29/2024 in [facility]  C-SSRS RISK CATEGORY Error: Q3, 4, or 5 should not be populated when Q2 is No Low Risk Low Risk        Assessment and Plan:  Assessment - Diagnosis: Bipolar disorder with severe depression (HCC) [F31.4]  2. Generalized anxiety disorder [F41.1]  3. Mixed obsessional thoughts and acts [F42.2]  4. Hallucinations [R44.3]   Differential Diagnosis: Borderline Personality Disorder, recurrent abandonment   - Progress: Follow-up visit, pt still appears anxious - Risk Factors: Suicidal risk, worsening symptoms  Plan - Medications:  Increase Lamictal  to 200 mg by mouth once daily for 2 weeks.   Patient was educated on the importance of monitoring for rashes and to stop taking medication should a rash develop and to contact the clinic.  Patient also reported nausea and vomiting as well as headache and sedation advised to take at night as well as to wait for medication on the symptoms,  please notify clinic if symptoms last longer than 1 week. Increase Zyprexa  7.5mg  once a day at night for hallucination and sleep.  Pt reports having auditory and visual hallucinations. Pt educated about Tardive dyskinesia and to monitor for new abnormal or uncontrollable movements.    Continue hydroxyzine  25 mg by mouth 3 times a day as needed for anxiety.  Patient educated on the signs sedation of medications and advised not to take the medication and drive.  Patient also advised to notify the clinic should she take the medication 3 times a day for consecutive days. - Psychotherapy: Patient provided a psychotherapy referral list. Pl - Education: Patient educated on importance of monitoring for rationing with Lamictal .  Patient educated on suicide risk in which she agrees that she will call 911 or go to emergency department should she have suicidal thoughts with or without a plan.  To educate on behaviors and to continue journaling and monitoring of her behaviors regarding texting which she states has been pushing people away.  Advised to follow the recommendation and treatment plan of the therapists while working around these OCD tendencies and behaviors. - Follow-Up: Patient will follow up in 2 weeks - Referrals: Therapy referral was provided. - Safety Planning: Patient was educated on her depression and to notify the clinic should she have worsening symptoms.  Patient also notified that should she have suicidal ideation with or without a plan to call 911 or go to the closest emergency department.  Patient denies having a firearm within the home.  Patient/Guardian was advised Release of Information must be obtained  prior to any record release in order to collaborate their care with an outside provider. Patient/Guardian was advised if they have not already done so to contact the registration department to sign all necessary forms in order for us  to release information regarding their care.   Consent: Patient/Guardian gives verbal consent for treatment and assignment of benefits for services provided during this visit. Patient/Guardian expressed understanding and agreed to proceed.    Arlana Labor, NP 05/11/2024, 9:12 AM

## 2024-05-19 ENCOUNTER — Ambulatory Visit: Admitting: Psychiatry

## 2024-05-25 ENCOUNTER — Encounter: Payer: Self-pay | Admitting: Psychiatry

## 2024-05-25 ENCOUNTER — Ambulatory Visit (INDEPENDENT_AMBULATORY_CARE_PROVIDER_SITE_OTHER): Admitting: Psychiatry

## 2024-05-25 ENCOUNTER — Other Ambulatory Visit: Payer: Self-pay

## 2024-05-25 VITALS — BP 137/89 | HR 85 | Temp 98.3°F | Ht 64.25 in | Wt 159.0 lb

## 2024-05-25 DIAGNOSIS — R443 Hallucinations, unspecified: Secondary | ICD-10-CM

## 2024-05-25 DIAGNOSIS — F314 Bipolar disorder, current episode depressed, severe, without psychotic features: Secondary | ICD-10-CM

## 2024-05-25 DIAGNOSIS — F411 Generalized anxiety disorder: Secondary | ICD-10-CM | POA: Diagnosis not present

## 2024-05-25 DIAGNOSIS — F422 Mixed obsessional thoughts and acts: Secondary | ICD-10-CM

## 2024-05-25 MED ORDER — GABAPENTIN 300 MG PO CAPS: 300.0000 mg | ORAL_CAPSULE | Freq: Two times a day (BID) | ORAL | 0 refills | Status: DC

## 2024-05-25 MED ORDER — GABAPENTIN 100 MG PO CAPS: 100.0000 mg | ORAL_CAPSULE | Freq: Two times a day (BID) | ORAL | 0 refills | Status: DC

## 2024-05-25 NOTE — Progress Notes (Signed)
 BH MD/PA/NP OP Progress Note  05/25/2024 11:41 AM Wendy Holmes  MRN:  [id number]  Chief Complaint:  Chief Complaint  Patient presents with   Follow-up   HPI: 38 year old female presenting to [facility] [place] for follow-up.  Patient reports that she is doing much better stating that the biggest difference now is that her auditory hallucinations at night are not present at all and have been taking care of since starting 7 7.5 mg of Zyprexa .  Patient also reports she is getting 8 hours of sleep at night and stating that the medication is working well.  Patient only reports that she is still anxious stating that she broke 2 items of considerable value due to decreased focus and not being able to remember where she placed it.  Patient is reporting that she is still having poor concentration and being forgetful for many things in her day-to-day routines.  Patient reports that she is spending more time with her daughter which was a stressor for her stating that she was able to go to a birthday party as well as being able to drive around town with her while running errands.  Patient also presents to today's appointment with her here done as well as dressed to go out with friends later.  Patient is still endorsing significant amount anxiety reporting that the anxiety seems to be still present despite all the other symptoms being controlled.  Based on this assessment interview patient is recommended to start gabapentin 300 mg 2 times a day for anxiety.  Patient will continue Lamictal  200 mg once daily, Zyprexa  7.5 mg once daily before bed, and hydroxyzine  25 mg as needed for panic attacks and anxiety attacks 3 times a day.  Patient denies SI, HI, AVH.  Patient with no other needs or concerns at this time.  Patient agrees to treatment plan.  Patient will follow up in 2 weeks. Visit Diagnosis:    ICD-10-CM   1. Bipolar disorder with severe depression (HCC)  F31.4     2. Generalized anxiety disorder  F41.1 gabapentin  (NEURONTIN) 300 MG capsule    3. Mixed obsessional thoughts and acts  F42.2     4. Hallucinations  R44.3       Past Psychiatric History:  Previous Psych Hospitalizations: Denies   Outpatient treatment: Seen a psychiatrist in 2017, unable to recall the name, currently being manages with primary care with [facility].   Medications Current: -Lamictal  200mg  once nightly, for bipolar depression -Hydroxyzine  25mg  TID as needed for anxiety -Zyprexa  7.5mg  once a day before bed, for hallucinations.  -Gabapentin 300mg  BID for anxeity   Medication Trials: - Latuda 40mg , side effects, stopped taking after 8 months, nausea vomiting - Seroquel - 2013, took 2 doses after waking up after taking medications and becoming violent. - Klonopin - 2018, took too many doses in one day, and then encountered stressors and attempted to take a overdose to commit suicide, in which she did not go to the ER, but she reports she took a handful.    Suicide & Violence: - Previous suicide attempt in 2018, did not go to the hospital.   Psychotherapy: Not currently participating in therapy.     Legal: Denies    Past Medical History:  Past Medical History:  Diagnosis Date   Anxiety    Asthma    Bipolar 1 disorder (HCC)    CIN III (cervical intraepithelial neoplasia III)    Depression    History of LEEP (loop electrosurgical excision procedure)  of cervix complicating pregnancy, second trimester    2012   Vaginal Pap smear, abnormal     Past Surgical History:  Procedure Laterality Date   CHOLECYSTECTOMY     TONSILLECTOMY     TYMPANOSTOMY TUBE PLACEMENT     childhood   URETERAL STENT PLACEMENT      Family Psychiatric History: Denies any biological history from her mother and father. Reports nephew has autism.   Family History: History reviewed. No pertinent family history.  Social History:  Social History   Socioeconomic History   Marital status: Divorced    Spouse name: Not on file   Number  of children: 2   Years of education: 12   Highest education level: Some college, no degree  Occupational History   Not on file  Tobacco Use   Smoking status: Former    Current packs/day: 0.00    Types: Cigarettes    Quit date: 06/10/2020    Years since quitting: 3.9   Smokeless tobacco: Never  Vaping Use   Vaping status: Never Used  Substance and Sexual Activity   Alcohol use: Not Currently    Alcohol/week: 3.0 standard drinks of alcohol    Types: 3 Standard drinks or equivalent per week   Drug use: Not Currently    Types: Marijuana, Cocaine   Sexual activity: Not Currently    Partners: Male    Birth control/protection: Implant  Other Topics Concern   Not on file  Social History Narrative   Not on file   Social Drivers of Health   Financial Resource Strain: Not on file  Food Insecurity: Not on file  Transportation Needs: Not on file  Physical Activity: Not on file  Stress: Not on file  Social Connections: Not on file    Allergies: No Known Allergies  Metabolic Disorder Labs: No results found for: "HGBA1C", "MPG" No results found for: "PROLACTIN" No results found for: "CHOL", "TRIG", "HDL", "CHOLHDL", "VLDL", "LDLCALC" Lab Results  Component Value Date   TSH 2.16 08/13/2012    Therapeutic Level Labs: No results found for: "LITHIUM" No results found for: "VALPROATE" No results found for: "CBMZ"  Current Medications: Current Outpatient Medications  Medication Sig Dispense Refill   albuterol  (PROVENTIL ) (2.5 MG/3ML) 0.083% nebulizer solution Take 3 mLs (2.5 mg total) by nebulization every 6 (six) hours as needed for wheezing or shortness of breath. 75 mL 12   etonogestrel  (NEXPLANON ) 68 MG IMPL implant 1 each by Subdermal route once.     hydrOXYzine  (ATARAX ) 25 MG tablet Take 1 tablet (25 mg total) by mouth 3 (three) times daily. 30 tablet 2   lamoTRIgine  (LAMICTAL ) 100 MG tablet Take 1 tablet (100 mg total) by mouth daily. 30 tablet 0   lamoTRIgine  (LAMICTAL )  200 MG tablet Take 1 tablet (200 mg total) by mouth daily. 30 tablet 2   loperamide  (IMODIUM ) 2 MG capsule Take 1 capsule (2 mg total) by mouth 4 (four) times daily as needed for diarrhea or loose stools. 12 capsule 0   methylPREDNISolone  (MEDROL  DOSEPAK) 4 MG TBPK tablet Take 6 pills on day one then decrease by 1 pill each day 21 tablet 0   OLANZapine  (ZYPREXA ) 7.5 MG tablet Take 1 tablet (7.5 mg total) by mouth at bedtime. 30 tablet 1   ondansetron  (ZOFRAN -ODT) 4 MG disintegrating tablet Take 1 tablet (4 mg total) by mouth every 8 (eight) hours as needed for nausea or vomiting. 20 tablet 0   traMADol  (ULTRAM ) 50 MG tablet Take 1 tablet (  50 mg total) by mouth every 6 (six) hours as needed. 15 tablet 0   No current facility-administered medications for this visit.     Musculoskeletal: Strength & Muscle Tone: within normal limits Gait & Station: normal Patient leans: N/A  Psychiatric Specialty Exam: Review of Systems  Constitutional: Negative.   HENT: Negative.    Eyes: Negative.   Respiratory: Negative.    Cardiovascular: Negative.   Gastrointestinal: Negative.   Endocrine: Negative.   Genitourinary: Negative.   Musculoskeletal: Negative.   Skin: Negative.   Allergic/Immunologic: Negative.   Neurological: Negative.   Hematological: Negative.   Psychiatric/Behavioral:  The patient is nervous/anxious.     Blood pressure 137/89, pulse 85, temperature 98.3 F (36.8 C), temperature source Temporal, height 5' 4.25" (1.632 m), weight 159 lb (72.1 kg).Body mass index is 27.08 kg/m.  General Appearance: Well Groomed  Eye Contact:  Good  Speech:  Pressured  Volume:  Increased  Mood:  Euthymic  Affect:  Congruent  Thought Process:  Coherent  Orientation:  Full (Time, Place, and Person)  Thought Content: Logical   Suicidal Thoughts:  No  Homicidal Thoughts:  No  Memory:  Immediate;   Good Recent;   Good Remote;   Good  Judgement:  Good  Insight:  Good  Psychomotor Activity:   Normal  Concentration:  Concentration: Good and Attention Span: Good  Recall:  Good  Fund of Knowledge: Good  Language: Good  Akathisia:  No  Handed:  Right  AIMS (if indicated):   Assets:  Financial Resources/Insurance Housing Vocational/Educational  ADL's:  Intact  Cognition: WNL  Sleep:  Good   Screenings: GAD-7    Flowsheet Row Office Visit from 05/11/2024 in [facility] Office Visit from 04/29/2024 in [facility]  Total GAD-7 Score 8 21      PHQ2-9    Flowsheet Row Office Visit from 05/11/2024 in [facility] Office Visit from 04[facility]29/2024 in [facility] Office Visit from 04/13/2024 in [facility] Office Visit from 03/29/2024 in [facility] Office Visit from 08/10/2020 in [facility]  PHQ-2 Total Score 3 6 4 6  0  PHQ-9 Total Score 11 26 20 25  --      Flowsheet Row Office Visit from 05/11/2024 in [facility] Office Visit from 04/29/2024 in [facility] Office Visit from 04/13/2024 in [facility]  C-SSRS RISK CATEGORY Low Risk Error: Q3, 4, or 5 should not be populated when Q2 is No Low Risk        Assessment and Plan:  Assessment - Diagnosis: Bipolar disorder with severe depression (HCC) [F31.4]  2. Generalized anxiety disorder [F41.1]  3. Mixed obsessional thoughts and acts [F42.2]  4. Hallucinations [R44.3]   Differential Diagnosis: Borderline Personality Disorder, recurrent abandonment   - Progress: Patient reporting that she her symptoms are greatly improving stating no auditory hallucinations as well as no sleeping issues stating that she is sleeping from 10 to 7 AM every night.  Patient is  reporting improvement in her daily life but states that she has continued to have anxiety. - Risk Factors: Suicidal risk, worsening symptoms  Plan - Medications:  Continue Lamictal  to 200 mg by mouth once daily.  Patient was educated on the importance of monitoring for rashes and to stop taking medication should a rash develop  and to contact the clinic.  Patient also reported nausea and vomiting as well as headache and sedation advised to take at night as well as to wait for medication on the symptoms, please notify clinic if symptoms last longer than 1 week. Continue Zyprexa  7.5mg  once a day at night for hallucination and sleep.  Pt reports having auditory and visual hallucinations. Pt educated about Tardive dyskinesia and to monitor for new abnormal or uncontrollable movements.    Continue hydroxyzine  25 mg by mouth 3 times a day as needed for anxiety.  Patient educated on the signs sedation of medications and advised not to take the medication and drive.  Patient also advised to notify the clinic should she take the medication 3 times a day for consecutive days. Start Gabapentin 300mg  BID for anxiety, she has been educated on the sedating nature of the medication and advised to take the prescription only as instructed. - Psychotherapy: Patient placed on a wait list here at [facility] and is waiting for in person sessions. - Education: Patient educated on importance of monitoring for rationing with Lamictal .  Patient educated on suicide risk in which she agrees that she will call 911 or go to emergency department should she have suicidal thoughts with or without a plan.  To educate on behaviors and to continue journaling and monitoring of her behaviors regarding texting which she states has been pushing people away.  Advised to follow the recommendation and treatment plan of the therapists while working around these OCD tendencies and behaviors. - Follow-Up: Patient will follow up in 2 weeks - Referrals:  Therapy referral was provided. - Safety Planning: Patient was educated on her depression and to notify the clinic should she have worsening symptoms.  Patient also notified that should she have suicidal ideation with or without a plan to call 911 or go to the closest emergency department.  Patient denies having a firearm within the home.  Patient/Guardian was advised Release of Information must be obtained prior to any record release in order to collaborate their care with an outside provider. Patient/Guardian was advised if they have not already done so to contact the registration department to sign all necessary forms in order for us  to release information regarding their care.   Consent: Patient/Guardian gives verbal consent for treatment and assignment of benefits for services provided during this visit. Patient/Guardian expressed understanding and agreed to proceed.   This office note has been dictated. This dictation was prepared using Air traffic controller. As a result, errors may occur. When identified, these errors have been corrected. While every attempt is made to correct errors during dictation, errors may still exist.   Arlana Labor, NP 05/25/2024, 11:41 AM

## 2024-06-08 ENCOUNTER — Ambulatory Visit: Admitting: Psychiatry

## 2024-06-09 ENCOUNTER — Ambulatory Visit (INDEPENDENT_AMBULATORY_CARE_PROVIDER_SITE_OTHER): Admitting: Psychiatry

## 2024-06-09 ENCOUNTER — Encounter: Payer: Self-pay | Admitting: Psychiatry

## 2024-06-09 VITALS — BP 118/76 | HR 74 | Temp 98.4°F | Ht 64.25 in | Wt 162.8 lb

## 2024-06-09 DIAGNOSIS — F411 Generalized anxiety disorder: Secondary | ICD-10-CM | POA: Diagnosis not present

## 2024-06-09 DIAGNOSIS — F314 Bipolar disorder, current episode depressed, severe, without psychotic features: Secondary | ICD-10-CM

## 2024-06-09 DIAGNOSIS — R443 Hallucinations, unspecified: Secondary | ICD-10-CM

## 2024-06-09 DIAGNOSIS — F422 Mixed obsessional thoughts and acts: Secondary | ICD-10-CM | POA: Diagnosis not present

## 2024-06-09 MED ORDER — LAMOTRIGINE 200 MG PO TABS: 200.0000 mg | ORAL_TABLET | Freq: Every day | ORAL | 1 refills | Status: DC

## 2024-06-09 MED ORDER — OLANZAPINE 7.5 MG PO TABS: 7.5000 mg | ORAL_TABLET | Freq: Every day | ORAL | 1 refills | Status: DC

## 2024-06-09 MED ORDER — GABAPENTIN 300 MG PO CAPS
300.0000 mg | ORAL_CAPSULE | Freq: Two times a day (BID) | ORAL | 1 refills | Status: DC
Start: 2024-06-09 — End: 2024-06-23

## 2024-06-09 NOTE — Progress Notes (Signed)
 [facility] MD/PA/NP OP Progress Note  06/09/2024 2:02 PM Wendy Holmes  MRN:  [id number]  Chief Complaint:  Chief Complaint  Patient presents with   Follow-up   HPI: 38 year old female presents [facility] for follow-up.  Patient reports that she is doing much better and stating the medication as she is taking is appropriate and is helping her manage her symptoms.  Patient reports improved relationship with her daughter and her family stating that she is not feeling the compulsive nature to over the next and having outbursts with her family if things are not as she has planned.  Patient reports that she is doing well in her home and her son is doing well stating that she is looking forward to the summer spending, [place].  Patient continues to state that her worry and hallucinations have gone down since starting Zyprexa  and Zyprexa  sexually helping her 7 to 8 hours of sleep at night.  Based on this assessment interview is recommended for the patient to continue medications as prescribed.  Patient will follow up in 2 weeks.  Patient with no other questions or concerns at this time.  Patient denies SI, HI, AVH.  Patient is in agreement with treatment plan. Visit Diagnosis:    ICD-10-CM   1. Hallucinations  R44.3 OLANZapine  (ZYPREXA ) 7.5 MG tablet    2. Generalized anxiety disorder  F41.1 gabapentin  (NEURONTIN ) 300 MG capsule      Past Psychiatric History:  Previous Psych Hospitalizations: Denies   Outpatient treatment: Seen a psychiatrist in 2017, unable to recall the name, currently being manages with primary care with [facility].   Medications Current: -Lamictal  200mg  once nightly, for bipolar depression -Hydroxyzine  25mg  TID as needed for anxiety -Zyprexa  7.5mg  once a day before bed, for hallucinations.  -Gabapentin  300mg  BID for anxeity   Medication Trials: - Latuda 40mg , side effects, stopped taking after 8 months, nausea vomiting - Seroquel - 2013, took 2 doses after waking up after taking  medications and becoming violent. - Klonopin - 2018, took too many doses in one day, and then encountered stressors and attempted to take a overdose to commit suicide, in which she did not go to the ER, but she reports she took a handful.    Suicide & Violence: - Previous suicide attempt in 2018, did not go to the hospital.   Psychotherapy: Not currently participating in therapy.     Legal: Denies  Past Medical History:  Past Medical History:  Diagnosis Date   Anxiety    Asthma    Bipolar 1 disorder (HCC)    CIN III (cervical intraepithelial neoplasia III)    Depression    History of LEEP (loop electrosurgical excision procedure) of cervix complicating pregnancy, second trimester    2012   Vaginal Pap smear, abnormal     Past Surgical History:  Procedure Laterality Date   CHOLECYSTECTOMY     TONSILLECTOMY     TYMPANOSTOMY TUBE PLACEMENT     childhood   URETERAL STENT PLACEMENT      Family Psychiatric History: No additional  Family History: History reviewed. No pertinent family history.  Social History:  Social History   Socioeconomic History   Marital status: Divorced    Spouse name: Not on file   Number of children: 2   Years of education: 12   Highest education level: Some college, no degree  Occupational History   Not on file  Tobacco Use   Smoking status: Former    Current packs/day: 0.00  Types: Cigarettes    Quit date: 06/10/2020    Years since quitting: 4.0   Smokeless tobacco: Never  Vaping Use   Vaping status: Never Used  Substance and Sexual Activity   Alcohol use: Not Currently    Alcohol/week: 3.0 standard drinks of alcohol    Types: 3 Standard drinks or equivalent per week   Drug use: Not Currently    Types: Marijuana, Cocaine   Sexual activity: Not Currently    Partners: Male    Birth control/protection: Implant  Other Topics Concern   Not on file  Social History Narrative   Not on file   Social Drivers of Health   Financial  Resource Strain: Not on file  Food Insecurity: Not on file  Transportation Needs: Not on file  Physical Activity: Not on file  Stress: Not on file  Social Connections: Not on file    Allergies: No Known Allergies  Metabolic Disorder Labs: No results found for: HGBA1C, MPG No results found for: PROLACTIN No results found for: CHOL, TRIG, HDL, CHOLHDL, VLDL, LDLCALC Lab Results  Component Value Date   TSH 2.16 08/13/2012    Therapeutic Level Labs: No results found for: LITHIUM No results found for: VALPROATE No results found for: CBMZ  Current Medications: Current Outpatient Medications  Medication Sig Dispense Refill   albuterol  (PROVENTIL ) (2.5 MG/3ML) 0.083% nebulizer solution Take 3 mLs (2.5 mg total) by nebulization every 6 (six) hours as needed for wheezing or shortness of breath. 75 mL 12   etonogestrel  (NEXPLANON ) 68 MG IMPL implant 1 each by Subdermal route once.     gabapentin  (NEURONTIN ) 300 MG capsule Take 1 capsule (300 mg total) by mouth 2 (two) times daily. 60 capsule 0   hydrOXYzine  (ATARAX ) 25 MG tablet Take 1 tablet (25 mg total) by mouth 3 (three) times daily. 30 tablet 2   lamoTRIgine  (LAMICTAL ) 100 MG tablet Take 1 tablet (100 mg total) by mouth daily. 30 tablet 0   lamoTRIgine  (LAMICTAL ) 200 MG tablet Take 1 tablet (200 mg total) by mouth daily. 30 tablet 2   loperamide  (IMODIUM ) 2 MG capsule Take 1 capsule (2 mg total) by mouth 4 (four) times daily as needed for diarrhea or loose stools. 12 capsule 0   methylPREDNISolone  (MEDROL  DOSEPAK) 4 MG TBPK tablet Take 6 pills on day one then decrease by 1 pill each day 21 tablet 0   OLANZapine  (ZYPREXA ) 7.5 MG tablet Take 1 tablet (7.5 mg total) by mouth at bedtime. 30 tablet 1   ondansetron  (ZOFRAN -ODT) 4 MG disintegrating tablet Take 1 tablet (4 mg total) by mouth every 8 (eight) hours as needed for nausea or vomiting. 20 tablet 0   traMADol  (ULTRAM ) 50 MG tablet Take 1 tablet (50 mg total)  by mouth every 6 (six) hours as needed. 15 tablet 0   No current facility-administered medications for this visit.     Musculoskeletal: Strength & Muscle Tone: within normal limits Gait & Station: normal Patient leans: N/A  Psychiatric Specialty Exam: Review of Systems  Blood pressure 118/76, pulse 74, temperature 98.4 F (36.9 C), temperature source Temporal, height 5' 4.25 (1.632 m), weight 162 lb 12.8 oz (73.8 kg), SpO2 99%.Body mass index is 27.73 kg/m.  General Appearance: Well Groomed  Eye Contact:  Good  Speech:  Clear and Coherent  Volume:  Normal  Mood:  Anxious  Affect:  Appropriate  Thought Process:  Coherent  Orientation:  Full (Time, Place, and Person)  Thought Content: Logical   Suicidal  Thoughts:  No  Homicidal Thoughts:  No  Memory:  Immediate;   Good Recent;   Good Remote;   Good  Judgement:  Good  Insight:  Good  Psychomotor Activity:  Normal  Concentration:  Concentration: Good  Recall:  Good  Fund of Knowledge: Good  Language: Good  Akathisia:  No  Handed:  Right  AIMS (if indicated):   Assets:  Desire for Improvement Financial Resources/Insurance Housing  ADL's:  Intact  Cognition: WNL  Sleep:  Good   Screenings: GAD-7    Flowsheet Row Office Visit from 05/25/2024 in [facility] Office Visit from 05/11/2024 in [facility] Visit from [facility]/29/2024 in [facility]  Total GAD-7 Score 15 8 21    PHQ2-9    Flowsheet Row Office Visit from 05/25/2024 in [facility] Office Visit from 05/11/2024 in [facility] Office Visit from 04/29/2024 in [facility] Office Visit from 04/13/2024 in [facility] Office Visit from 03/29/2024 in [facility]  PHQ-2 Total Score 2 3 6 4 6   PHQ-9 Total Score 13 11 26 20 25    Flowsheet Row Office Visit from 05/25/2024 in [facility] Office Visit from 05/11/2024 in [facility] Office Visit from 04/29/2024 in [facility]  C-SSRS RISK CATEGORY No Risk Low Risk Error: Q3, 4, or 5 should not be populated when Q2 is No     Assessment and Plan:  Assessment - Diagnosis: Bipolar disorder with severe depression (HCC) [F31.4]  2. Generalized anxiety disorder [F41.1]  3. Mixed obsessional thoughts and acts [F42.2]  4. Hallucinations [R44.3]   Differential Diagnosis: Borderline Personality Disorder, recurrent abandonment   - Progress: Patient reporting that she her symptoms are greatly improving stating no auditory hallucinations as well as no sleeping issues stating that she is sleeping from 10 to 7 AM every night.  Patient is reporting improvement in her daily life but states that she has continued to have anxiety. - Risk Factors: Suicidal risk, worsening symptoms  Plan - Medications:  Continue Lamictal  to 200 mg by mouth once daily.  Patient was educated on the importance of monitoring for rashes and to stop taking medication should a rash develop and to contact the clinic.  Patient also reported nausea and vomiting as well as headache and sedation advised to take at night as well as to wait for medication on the symptoms, please notify clinic if symptoms last longer than 1 week. Continue Zyprexa  7.5mg  once a day at night for hallucination and sleep.  Pt reports having auditory and visual hallucinations. Pt educated about Tardive dyskinesia and to monitor for new abnormal or uncontrollable movements.    Continue hydroxyzine  25 mg by mouth 3 times a day as needed for anxiety.  Patient educated on the signs sedation of medications and advised not to take the medication and drive.   Patient also advised to notify the clinic should she take the medication 3 times a day for consecutive days. Continue Gabapentin  300mg  BID for anxiety, she has been educated on the sedating nature of the medication and advised to take the prescription only as instructed. - Psychotherapy: Patient placed on a wait list here at [facility] and is waiting for in person sessions. - Education: Patient educated on importance of monitoring for  rationing with Lamictal .  Patient educated on suicide risk in which she agrees that she will call 911 or go to emergency department should she have suicidal thoughts with or without a plan.  To educate on behaviors and to continue journaling and monitoring of her behaviors regarding texting which she states has been pushing people away.  Advised to follow the recommendation and treatment plan of the therapists while working around these OCD tendencies and behaviors. - Follow-Up: Patient will follow up in 2 weeks - Referrals: Therapy referral was provided. - Safety Planning: Patient was educated on her depression and to notify the clinic should she have worsening symptoms.  Patient also notified that should she have suicidal ideation with or without a plan to call 911 or go to the closest emergency department.  Patient denies having a firearm within the home.   Patient/Guardian was advised Release of Information must be obtained prior to any record release in order to collaborate their care with an outside provider. Patient/Guardian was advised if they have not already done so to contact the registration department to sign all necessary forms in order for us  to release information regarding their care.   Consent: Patient/Guardian gives verbal consent for treatment and assignment of benefits for services provided during this visit. Patient/Guardian expressed understanding and agreed to proceed.    Dorn Jama Der, NP 06/09/2024, 2:02 PM

## 2024-06-23 ENCOUNTER — Other Ambulatory Visit: Payer: Self-pay

## 2024-06-23 ENCOUNTER — Encounter: Payer: Self-pay | Admitting: Psychiatry

## 2024-06-23 ENCOUNTER — Ambulatory Visit (INDEPENDENT_AMBULATORY_CARE_PROVIDER_SITE_OTHER): Admitting: Psychiatry

## 2024-06-23 ENCOUNTER — Telehealth: Payer: Self-pay | Admitting: Psychiatry

## 2024-06-23 VITALS — BP 119/80 | HR 52 | Temp 97.3°F | Ht 64.25 in | Wt 159.2 lb

## 2024-06-23 DIAGNOSIS — F411 Generalized anxiety disorder: Secondary | ICD-10-CM | POA: Diagnosis not present

## 2024-06-23 DIAGNOSIS — R443 Hallucinations, unspecified: Secondary | ICD-10-CM | POA: Diagnosis not present

## 2024-06-23 DIAGNOSIS — F422 Mixed obsessional thoughts and acts: Secondary | ICD-10-CM

## 2024-06-23 DIAGNOSIS — F314 Bipolar disorder, current episode depressed, severe, without psychotic features: Secondary | ICD-10-CM

## 2024-06-23 MED ORDER — HYDROXYZINE HCL 25 MG PO TABS: 25.0000 mg | ORAL_TABLET | Freq: Three times a day (TID) | ORAL | 2 refills | Status: DC

## 2024-06-23 MED ORDER — GABAPENTIN 300 MG PO CAPS
300.0000 mg | ORAL_CAPSULE | Freq: Two times a day (BID) | ORAL | 1 refills | Status: DC
Start: 2024-06-23 — End: 2024-08-04

## 2024-06-23 MED ORDER — OLANZAPINE 7.5 MG PO TABS: 7.5000 mg | ORAL_TABLET | Freq: Every day | ORAL | 1 refills | Status: DC

## 2024-06-23 MED ORDER — LAMOTRIGINE 200 MG PO TABS: 200.0000 mg | ORAL_TABLET | Freq: Every day | ORAL | 1 refills | Status: DC

## 2024-06-23 NOTE — Progress Notes (Signed)
 BH MD/PA/NP OP Progress Note  06/23/2024 9:09 AM Wendy Holmes  MRN:  [id number]  Chief Complaint:  Chief Complaint  Patient presents with   Follow-up   HPI: 38 year old female presenting [facility] for follow-up.  Patient reports that she has been doing well at home stating that she has been trying to the clutter in her house and has been utilizing checklists and lists to be able to prioritize and to try to get as much done around the house.  Patient also reports that lately due to the medication management she has been getting control of her depression, anxiety and her auditory or visual hallucinations, though is still experiencing very extreme amounts of energy.  Patient reports that she is sleeping 7 to 8 hours a day but as soon as she wakes up she has so much energy that she is almost unable to stop.  Patient does report that this week she felt motivated and decided to apply to [employer] that was 4 minutes from her house in which she applied a right away.  Patient then drove to the store the same day and was offered a job and did not orientation within the week.  Patient reports that she has been scheduled and worked this last Sunday stating that she went through her checklist as required for her job which is only required to go through once but she went through it 3 times.  Patient reports that her OCD tendencies are signed to come out while at work and states that it is causing her some anxiety.  Patient is in an appointment at this time pulling at her pants with her fingers as well as wringing her hands was stating that she just cannot seem to get a control of all the energy that she has.  Based on this assessment interview patient is recommended continue current medication regimen and this case will be shared with Dr. Arfeen as the targeted symptoms have been achieved but patient is experiencing extreme amount of energy.  Patient is in agreement with treatment plan patient will follow up in 2 weeks.   Patient with no other questions or concerns at this time patient denies SI, HI and denies AVH at this time. Visit Diagnosis:    ICD-10-CM   1. Bipolar disorder with severe depression (HCC)  F31.4 lamoTRIgine  (LAMICTAL ) 200 MG tablet    2. Generalized anxiety disorder  F41.1 gabapentin  (NEURONTIN ) 300 MG capsule    hydrOXYzine  (ATARAX ) 25 MG tablet    3. Hallucinations  R44.3 OLANZapine  (ZYPREXA ) 7.5 MG tablet    4. Mixed obsessional thoughts and acts  F42.2       Past Psychiatric History:  Previous Psych Hospitalizations: Denies   Outpatient treatment: Seen a psychiatrist in 2017, unable to recall the name, currently being manages with primary care with [facility].   Medications Current: -Lamictal  200mg  once nightly, for bipolar depression -Hydroxyzine  25mg  TID as needed for anxiety -Zyprexa  7.5mg  once a day before bed, for hallucinations.  -Gabapentin  300mg  BID for anxeity   Medication Trials: - Latuda 40mg , side effects, stopped taking after 8 months, nausea vomiting - Seroquel - 2013, took 2 doses after waking up after taking medications and becoming violent. - Klonopin - 2018, took too many doses in one day, and then encountered stressors and attempted to take a overdose to commit suicide, in which she did not go to the ER, but she reports she took a handful.    Suicide & Violence: - Previous suicide  attempt in 2018, did not go to the hospital.   Psychotherapy: Not currently participating in therapy.     Legal: Denies  Past Medical History:  Past Medical History:  Diagnosis Date   Anxiety    Asthma    Bipolar 1 disorder (HCC)    CIN III (cervical intraepithelial neoplasia III)    Depression    History of LEEP (loop electrosurgical excision procedure) of cervix complicating pregnancy, second trimester    2012   Vaginal Pap smear, abnormal     Past Surgical History:  Procedure Laterality Date   CHOLECYSTECTOMY     TONSILLECTOMY     TYMPANOSTOMY TUBE PLACEMENT      childhood   URETERAL STENT PLACEMENT      Family Psychiatric History: No additional  Family History: History reviewed. No pertinent family history.  Social History:  Social History   Socioeconomic History   Marital status: Divorced    Spouse name: Not on file   Number of children: 2   Years of education: 12   Highest education level: Some college, no degree  Occupational History   Not on file  Tobacco Use   Smoking status: Former    Current packs/day: 0.00    Types: Cigarettes    Quit date: 06/10/2020    Years since quitting: 4.0   Smokeless tobacco: Never  Vaping Use   Vaping status: Never Used  Substance and Sexual Activity   Alcohol use: Not Currently    Alcohol/week: 3.0 standard drinks of alcohol    Types: 3 Standard drinks or equivalent per week   Drug use: Not Currently    Types: Marijuana, Cocaine   Sexual activity: Not Currently    Partners: Male    Birth control/protection: Implant  Other Topics Concern   Not on file  Social History Narrative   Not on file   Social Drivers of Health   Financial Resource Strain: Not on file  Food Insecurity: Not on file  Transportation Needs: Not on file  Physical Activity: Not on file  Stress: Not on file  Social Connections: Not on file    Allergies: No Known Allergies  Metabolic Disorder Labs: No results found for: HGBA1C, MPG No results found for: PROLACTIN No results found for: CHOL, TRIG, HDL, CHOLHDL, VLDL, LDLCALC Lab Results  Component Value Date   TSH 2.16 08/13/2012    Therapeutic Level Labs: No results found for: LITHIUM No results found for: VALPROATE No results found for: CBMZ  Current Medications: Current Outpatient Medications  Medication Sig Dispense Refill   albuterol  (PROVENTIL ) (2.5 MG/3ML) 0.083% nebulizer solution Take 3 mLs (2.5 mg total) by nebulization every 6 (six) hours as needed for wheezing or shortness of breath. 75 mL 12   etonogestrel   (NEXPLANON ) 68 MG IMPL implant 1 each by Subdermal route once.     gabapentin  (NEURONTIN ) 300 MG capsule Take 1 capsule (300 mg total) by mouth 2 (two) times daily. 180 capsule 1   hydrOXYzine  (ATARAX ) 25 MG tablet Take 1 tablet (25 mg total) by mouth 3 (three) times daily. 30 tablet 2   lamoTRIgine  (LAMICTAL ) 200 MG tablet Take 1 tablet (200 mg total) by mouth daily. 90 tablet 1   loperamide  (IMODIUM ) 2 MG capsule Take 1 capsule (2 mg total) by mouth 4 (four) times daily as needed for diarrhea or loose stools. 12 capsule 0   methylPREDNISolone  (MEDROL  DOSEPAK) 4 MG TBPK tablet Take 6 pills on day one then decrease by 1 pill each day  21 tablet 0   OLANZapine  (ZYPREXA ) 7.5 MG tablet Take 1 tablet (7.5 mg total) by mouth at bedtime. 90 tablet 1   ondansetron  (ZOFRAN -ODT) 4 MG disintegrating tablet Take 1 tablet (4 mg total) by mouth every 8 (eight) hours as needed for nausea or vomiting. 20 tablet 0   traMADol  (ULTRAM ) 50 MG tablet Take 1 tablet (50 mg total) by mouth every 6 (six) hours as needed. 15 tablet 0   No current facility-administered medications for this visit.     Musculoskeletal: Strength & Muscle Tone: within normal limits Gait & Station: normal Patient leans: N/A  Psychiatric Specialty Exam: Review of Systems  Constitutional: Negative.   HENT: Negative.    Eyes: Negative.   Respiratory: Negative.    Cardiovascular: Negative.   Gastrointestinal: Negative.   Endocrine: Negative.   Genitourinary: Negative.   Musculoskeletal: Negative.   Skin: Negative.   Allergic/Immunologic: Negative.   Neurological: Negative.   Hematological: Negative.     Blood pressure 119/80, pulse (!) 52, temperature (!) 97.3 F (36.3 C), temperature source Temporal, height 5' 4.25 (1.632 m), weight 159 lb 3.2 oz (72.2 kg).Body mass index is 27.11 kg/m.  General Appearance: Well Groomed  Eye Contact:  Good  Speech:  Clear and Coherent  Volume:  Normal  Mood:  Anxious  Affect:  Appropriate   Thought Process:  Coherent  Orientation:  Full (Time, Place, and Person)  Thought Content: Logical   Suicidal Thoughts:  No  Homicidal Thoughts:  No  Memory:  Immediate;   Good Recent;   Good Remote;   Good  Judgement:  Good  Insight:  Good  Psychomotor Activity:  Normal  Concentration:  Concentration: Good and Attention Span: Good  Recall:  Good  Fund of Knowledge: Good  Language: Good  Akathisia:  No  Handed:  Right  AIMS (if indicated):   Assets:  Desire for Improvement Financial Resources/Insurance Housing  ADL's:  Intact  Cognition: WNL  Sleep:  Good   Screenings: GAD-7    Flowsheet Row Office Visit from 06/09/2024 in [facility] Visit from 05/25/2024 in [facility] Visit from 05/11/2024 in [facility] Visit from 04/29/2024 in [facility]  Total GAD-7 Score 8 15 8 21    PHQ2-9    Flowsheet Row Office Visit from 06/09/2024 in [facility] Office Visit from 05/25/2024 in [facility] Office Visit from 05/11/2024 in [facility] Office Visit from 04/29/2024 in [facility] Office Visit from 04/13/2024 in [facility]  PHQ-2 Total Score 2 2 3 6 4   PHQ-9 Total Score 7 13 11 26 20    Flowsheet Row Office Visit from 06/09/2024 in [facility] Office Visit from 05/25/2024 in [facility] Office Visit from 05/11/2024 in [facility]  C-SSRS RISK CATEGORY No Risk No Risk Low Risk     Assessment and Plan:  Assessment - Diagnosis: Bipolar disorder with severe depression (HCC) [F31.4]  2. Generalized  anxiety disorder [F41.1]  3. Mixed obsessional thoughts and acts [F42.2]  4. Hallucinations [R44.3]   Differential Diagnosis: Borderline Personality Disorder, recurrent abandonment   - Progress: Patient reporting that she her symptoms are greatly improving stating no auditory hallucinations as well as no sleeping issues stating that she  is sleeping from 10 to 7 AM every night.  Patient is reporting improvement in her daily life but states that she has continued to have anxiety. - Risk Factors: Suicidal risk, worsening symptoms  Plan - Medications:  Continue Lamictal  to 200 mg by mouth once daily.  Patient was educated on the importance of monitoring for rashes and to stop taking medication should a rash develop and to contact the clinic.  Patient also reported nausea and vomiting as well as headache and sedation advised to take at night as well as to wait for medication on the symptoms, please notify clinic if symptoms last longer than 1 week. Continue Zyprexa  7.5mg  once a day at night for hallucination and sleep.  Pt reports having auditory and visual hallucinations. Pt educated about Tardive dyskinesia and to monitor for new abnormal or uncontrollable movements.    Continue hydroxyzine  25 mg by mouth 3 times a day as needed for anxiety.  Patient educated on the signs sedation of medications and advised not to take the medication and drive.  Patient also advised to notify the clinic should she take the medication 3 times a day for consecutive days. Continue Gabapentin  300mg  BID for anxiety, she has been educated on the sedating nature of the medication and advised to take the prescription only as instructed. - Psychotherapy: Patient placed on a wait list here at [facility] and is waiting for in person sessions. - Education: Patient educated on importance of monitoring for rationing with Lamictal .  Patient educated on suicide risk in which she agrees that she will call 911 or go to emergency department  should she have suicidal thoughts with or without a plan.  To educate on behaviors and to continue journaling and monitoring of her behaviors regarding texting which she states has been pushing people away.  Advised to follow the recommendation and treatment plan of the therapists while working around these OCD tendencies and behaviors. Discuss case with Dr. Curry regarding restlessness and energy.  - Follow-Up: Patient will follow up in 2 weeks - Referrals: Therapy referral was provided. - Safety Planning: Patient was educated on her depression and to notify the clinic should she have worsening symptoms.  Patient also notified that should she have suicidal ideation with or without a plan to call 911 or go to the closest emergency department.  Patient denies having a firearm within the home.   Patient/Guardian was advised Release of Information must be obtained prior to any record release in order to collaborate their care with an outside provider. Patient/Guardian was advised if they have not already done so to contact the registration department to sign all necessary forms in order for us  to release information regarding their care.   Consent: Patient/Guardian gives verbal consent for treatment and assignment of benefits for services provided during this visit. Patient/Guardian expressed understanding and agreed to proceed.    Dorn Jama Der, NP 06/23/2024, 9:09 AM

## 2024-06-24 ENCOUNTER — Encounter: Payer: Self-pay | Admitting: Psychiatry

## 2024-06-24 NOTE — Progress Notes (Signed)
 To whom it may concern,  Wendy Holmes. Head is under my care at [facility], and I have been informed that she was called for jury duty.  It is in my professional recommendation that Wendy Holmes. [place] should be excused from [employer] as she is currently under treatment and is not fit to serve [employer] as of this date, until 11/22/24 while she is under treatment.    Please feel free to call me at [phone], should you need more clarification or questions.    Wendy Holmes PMHNP-C, DNP [facility] [address] [place], [place] [address] [phone]

## 2024-06-30 ENCOUNTER — Ambulatory Visit: Admitting: Psychiatry

## 2024-07-07 ENCOUNTER — Encounter: Payer: Self-pay | Admitting: Psychiatry

## 2024-07-07 ENCOUNTER — Ambulatory Visit: Admitting: Psychiatry

## 2024-07-07 VITALS — BP 112/78 | HR 55 | Temp 98.3°F | Ht 64.25 in | Wt 162.0 lb

## 2024-07-07 DIAGNOSIS — R443 Hallucinations, unspecified: Secondary | ICD-10-CM

## 2024-07-07 DIAGNOSIS — F314 Bipolar disorder, current episode depressed, severe, without psychotic features: Secondary | ICD-10-CM | POA: Diagnosis not present

## 2024-07-07 DIAGNOSIS — F411 Generalized anxiety disorder: Secondary | ICD-10-CM

## 2024-07-07 DIAGNOSIS — F422 Mixed obsessional thoughts and acts: Secondary | ICD-10-CM | POA: Diagnosis not present

## 2024-07-07 MED ORDER — BENZTROPINE MESYLATE 0.5 MG PO TABS: 1.0000 mg | ORAL_TABLET | Freq: Two times a day (BID) | ORAL | 0 refills | Status: DC

## 2024-07-07 MED ORDER — LAMOTRIGINE 150 MG PO TABS: 150.0000 mg | ORAL_TABLET | Freq: Every day | ORAL | 0 refills | Status: DC

## 2024-07-07 NOTE — Progress Notes (Signed)
 BH MD/PA/NP OP Progress Note  07/07/2024 9:35 AM Wendy Holmes  MRN:  [id number]  Chief Complaint:  Chief Complaint  Patient presents with   Follow-up   HPI: 38 year old female presents [employer].  Patient reports that she is having agitation as well as continued restlessness and fidgety behavior.  Patient states that she missed the last appointment due to the fact she was unable to find a ride and is requesting for resources to be able to get Medicaid ride.  Patient visit has been educated on the number to contact for Medicaid provided rights and provided resources from find help and DSS website.  Patient reports that she got into argument with her father who is her primary transportation stating that her hallucinations visually was argument that caused them to stop talking to each other which has made it difficult for her to find a ride as he was her main transportation.  Patient reports he continues to have symptoms of agitation, fidgetiness and restless behavior and stating that she was unable to return to her work at [employer] only working after 2 days and stopping.  Patient has been advised not to apply for any other jobs until we are able to get her behavior improved.  Patient reports that she is spending most of her time at home focusing on improving her home as well as taking care of her chickens.  Based on this assessment interview is recommended for the patient to stop hydroxyzine  and start Cogentin  0.5 mg twice a day, for agitation and restlessness.  Patient also encouraged to decrease Lamictal  to 150 mg once daily due to possible activation and of the akathisia.  Patient also will continue gabapentin  300 mg twice a day and Zyprexa  7.5 mg once daily at night.  If patient continues to have restlessness and akathisia Zyprexa  may have to be titrated down.  Patient is in agreement with treatment plan.  Patient denies SI, HI, AVH.  Patient with no other questions or concerns at this time.  Patient to  follow up in 1 week.  I personally spent a total of 30 minutes in the care of the patient today including preparing to see the patient, getting/reviewing separately obtained history, performing a medically appropriate exam/evaluation, counseling and educating, documenting clinical information in the EHR, and coordinating care.   Visit Diagnosis:    ICD-10-CM   1. Bipolar disorder with severe depression (HCC)  F31.4     2. Generalized anxiety disorder  F41.1     3. Hallucinations  R44.3     4. Mixed obsessional thoughts and acts  F42.2       Past Psychiatric History:  Previous Psych Hospitalizations: Denies   Outpatient treatment: Seen a psychiatrist in 2017, unable to recall the name, currently being manages with primary care with [facility].   Medications Current: -Lamictal  150mg  once nightly, for bipolar depression -Cogentin  0.5mg  BID -Zyprexa  7.5mg  once a day before bed, for hallucinations.  -Gabapentin  300mg  BID for anxeity   Medication Trials: - Latuda 40mg , side effects, stopped taking after 8 months, nausea vomiting - Seroquel - 2013, took 2 doses after waking up after taking medications and becoming violent. - Klonopin - 2018, took too many doses in one day, and then encountered stressors and attempted to take a overdose to commit suicide, in which she did not go to the ER, but she reports she took a handful.    Suicide & Violence: - Previous suicide attempt in 2018, did not go to  the hospital.   Psychotherapy: Not currently participating in therapy.     Legal: Denies  Past Medical History:  Past Medical History:  Diagnosis Date   Anxiety    Asthma    Bipolar 1 disorder (HCC)    CIN III (cervical intraepithelial neoplasia III)    Depression    History of LEEP (loop electrosurgical excision procedure) of cervix complicating pregnancy, second trimester    2012   Vaginal Pap smear, abnormal     Past Surgical History:  Procedure Laterality Date    CHOLECYSTECTOMY     TONSILLECTOMY     TYMPANOSTOMY TUBE PLACEMENT     childhood   URETERAL STENT PLACEMENT      Family Psychiatric History: No Additional  Family History: History reviewed. No pertinent family history.  Social History:  Social History   Socioeconomic History   Marital status: Divorced    Spouse name: Not on file   Number of children: 2   Years of education: 12   Highest education level: Some college, no degree  Occupational History   Not on file  Tobacco Use   Smoking status: Former    Current packs/day: 0.00    Types: Cigarettes    Quit date: 06/10/2020    Years since quitting: 4.0   Smokeless tobacco: Never  Vaping Use   Vaping status: Never Used  Substance and Sexual Activity   Alcohol use: Not Currently    Alcohol/week: 3.0 standard drinks of alcohol    Types: 3 Standard drinks or equivalent per week   Drug use: Not Currently    Types: Marijuana, Cocaine   Sexual activity: Not Currently    Partners: Male    Birth control/protection: Implant  Other Topics Concern   Not on file  Social History Narrative   Not on file   Social Drivers of Health   Financial Resource Strain: Not on file  Food Insecurity: Not on file  Transportation Needs: Not on file  Physical Activity: Not on file  Stress: Not on file  Social Connections: Not on file    Allergies: No Known Allergies  Metabolic Disorder Labs: No results found for: HGBA1C, MPG No results found for: PROLACTIN No results found for: CHOL, TRIG, HDL, CHOLHDL, VLDL, LDLCALC Lab Results  Component Value Date   TSH 2.16 08/13/2012    Therapeutic Level Labs: No results found for: LITHIUM No results found for: VALPROATE No results found for: CBMZ  Current Medications: Current Outpatient Medications  Medication Sig Dispense Refill   albuterol  (PROVENTIL ) (2.5 MG/3ML) 0.083% nebulizer solution Take 3 mLs (2.5 mg total) by nebulization every 6 (six) hours as needed for  wheezing or shortness of breath. 75 mL 12   etonogestrel  (NEXPLANON ) 68 MG IMPL implant 1 each by Subdermal route once.     gabapentin  (NEURONTIN ) 300 MG capsule Take 1 capsule (300 mg total) by mouth 2 (two) times daily. 180 capsule 1   hydrOXYzine  (ATARAX ) 25 MG tablet Take 1 tablet (25 mg total) by mouth 3 (three) times daily. 30 tablet 2   lamoTRIgine  (LAMICTAL ) 200 MG tablet Take 1 tablet (200 mg total) by mouth daily. 90 tablet 1   loperamide  (IMODIUM ) 2 MG capsule Take 1 capsule (2 mg total) by mouth 4 (four) times daily as needed for diarrhea or loose stools. 12 capsule 0   methylPREDNISolone  (MEDROL  DOSEPAK) 4 MG TBPK tablet Take 6 pills on day one then decrease by 1 pill each day 21 tablet 0   OLANZapine  (ZYPREXA )  7.5 MG tablet Take 1 tablet (7.5 mg total) by mouth at bedtime. 90 tablet 1   ondansetron  (ZOFRAN -ODT) 4 MG disintegrating tablet Take 1 tablet (4 mg total) by mouth every 8 (eight) hours as needed for nausea or vomiting. 20 tablet 0   traMADol  (ULTRAM ) 50 MG tablet Take 1 tablet (50 mg total) by mouth every 6 (six) hours as needed. 15 tablet 0   No current facility-administered medications for this visit.     Musculoskeletal: Strength & Muscle Tone: within normal limits Gait & Station: normal Patient leans: N/A  Psychiatric Specialty Exam: Review of Systems  Constitutional: Negative.   HENT: Negative.    Eyes: Negative.   Respiratory: Negative.    Cardiovascular: Negative.   Gastrointestinal: Negative.   Endocrine: Negative.   Genitourinary: Negative.   Musculoskeletal: Negative.   Skin: Negative.   Allergic/Immunologic: Negative.   Neurological: Negative.   Hematological: Negative.   Psychiatric/Behavioral:  Positive for agitation, decreased concentration and hallucinations. The patient is nervous/anxious.     Blood pressure 112/78, pulse (!) 55, temperature 98.3 F (36.8 C), temperature source Temporal, height 5' 4.25 (1.632 m), weight 162 lb (73.5 kg),  last menstrual period 07/07/2024, SpO2 99%.Body mass index is 27.59 kg/m.  General Appearance: Fairly Groomed  Eye Contact:  Good  Speech:  Negative and Clear and Coherent  Volume:  Normal  Mood:  Anxious and Irritable  Affect:  Appropriate  Thought Process:  Coherent  Orientation:  Full (Time, Place, and Person)  Thought Content: Logical   Suicidal Thoughts:  No  Homicidal Thoughts:  No  Memory:  Immediate;   Good Recent;   Good Remote;   Good  Judgement:  Fair  Insight:  Fair  Psychomotor Activity:  Normal  Concentration:  Concentration: Fair and Attention Span: Fair  Recall:  Good  Fund of Knowledge: Good  Language: Good  Akathisia:  Yes  Handed:  Right  AIMS (if indicated):   Assets:  Desire for Improvement Financial Resources/Insurance Housing  ADL's:  Intact  Cognition: WNL  Sleep:  Good   Screenings: GAD-7    Flowsheet Row Office Visit from 06/09/2024 in [facility] Office Visit from 05/25/2024 in [facility] Office Visit from 05/11/2024 in [facility] Office Visit from 04/29/2024 in [facility]  Total GAD-7 Score 8 15 8 21    PHQ2-9    Flowsheet Row Office Visit from 06/09/2024 in [facility] Office Visit from 05/25/2024 in [facility] Office Visit from 05/11/2024 in [facility] Office Visit from 04/29/2024 in [facility] Office Visit from 04/13/2024 in [facility]  PHQ-2 Total Score 2 2 3 6 4   PHQ-9 Total Score 7 13 11 26 20    Flowsheet Row Office Visit from 06/09/2024 in [facility] Office Visit from 05/25/2024 in [facility]  Visit from 05/11/2024 in [facility]  C-SSRS RISK CATEGORY No Risk No Risk Low Risk     Assessment and Plan:  Assessment - Diagnosis: Bipolar disorder with severe depression (HCC) [F31.4]  2. Generalized anxiety disorder [F41.1]  3. Mixed obsessional thoughts and acts [F42.2]  4. Hallucinations [R44.3]   Differential Diagnosis: Borderline Personality Disorder, recurrent abandonment   - Progress: Patient reporting that she her symptoms are  greatly improving stating no auditory hallucinations as well as no sleeping issues stating that she is sleeping from 10 to 7 AM every night.  Patient is reporting improvement in her daily life but states that she has continued to have anxiety. - Risk Factors: Suicidal risk, worsening symptoms  Plan - Medications:  Decrease Lamictal  to 150 mg by mouth once daily.  Patient was educated on the importance of monitoring for rashes and to stop taking medication should a rash develop and to contact the clinic.  Patient also reported nausea and vomiting as well as headache and sedation advised to take at night as well as to wait for medication on the symptoms, please notify clinic if symptoms last longer than 1 week. Continue Zyprexa  7.5mg  once a day at night for hallucination and sleep.  Pt reports having auditory and visual hallucinations. Pt educated about Tardive dyskinesia and to monitor for new abnormal or uncontrollable movements.    Stop Hydroxyzine  Start Cogentin  0.5mg  BID for agitation, fidgety, and restlessness.  Continue Gabapentin  300mg  BID for anxiety, she has been educated on the sedating nature of the medication and advised to take the prescription only as instructed. - Psychotherapy: Patient placed on a wait list here at [facility] and is waiting for in person sessions. - Education: Patient educated on importance of monitoring for rationing with Lamictal .  Patient educated on suicide risk in which she agrees that  she will call 911 or go to emergency department should she have suicidal thoughts with or without a plan.  To educate on behaviors and to continue journaling and monitoring of her behaviors regarding texting which she states has been pushing people away.  Advised to follow the recommendation and treatment plan of the therapists while working around these OCD tendencies and behaviors. Discuss case with Dr. Curry regarding restlessness and energy.  - Follow-Up: Patient will follow up in 2 weeks - Referrals: Therapy referral was provided. - Safety Planning: Patient was educated on her depression and to notify the clinic should she have worsening symptoms.  Patient also notified that should she have suicidal ideation with or without a plan to call 911 or go to the closest emergency department.  Patient denies having a firearm within the home.  Patient/Guardian was advised Release of Information must be obtained prior to any record release in order to collaborate their care with an outside provider. Patient/Guardian was advised if they have not already done so to contact the registration department to sign all necessary forms in order for us  to release information regarding their care.   Consent: Patient/Guardian gives verbal consent for treatment and assignment of benefits for services provided during this visit. Patient/Guardian expressed understanding and agreed to proceed.    Dorn Jama Der, NP 07/07/2024, 9:35 AM

## 2024-07-14 ENCOUNTER — Encounter: Payer: Self-pay | Admitting: Psychiatry

## 2024-07-14 ENCOUNTER — Ambulatory Visit (INDEPENDENT_AMBULATORY_CARE_PROVIDER_SITE_OTHER): Admitting: Psychiatry

## 2024-07-14 ENCOUNTER — Other Ambulatory Visit: Payer: Self-pay

## 2024-07-14 VITALS — BP 121/87 | HR 66 | Temp 97.5°F | Ht 64.25 in | Wt 163.2 lb

## 2024-07-14 DIAGNOSIS — F314 Bipolar disorder, current episode depressed, severe, without psychotic features: Secondary | ICD-10-CM

## 2024-07-14 DIAGNOSIS — F422 Mixed obsessional thoughts and acts: Secondary | ICD-10-CM | POA: Diagnosis not present

## 2024-07-14 DIAGNOSIS — F411 Generalized anxiety disorder: Secondary | ICD-10-CM | POA: Diagnosis not present

## 2024-07-14 MED ORDER — OLANZAPINE 5 MG PO TABS: 5.0000 mg | ORAL_TABLET | Freq: Every day | ORAL | 0 refills | Status: DC

## 2024-07-14 NOTE — Progress Notes (Signed)
 [facility] MD/PA/NP OP Progress Note  07/14/2024 11:02 AM [name]  MRN:  [id number]  Chief Complaint:  Chief Complaint  Patient presents with   Follow-up   HPI: 38 year old female presenting to [facility] for follow-up.  Patient reports that she continues to feel restless fidgety and feeling full of energy.  Patient does endorse though she has not picked up her medications that were recommended last week and states she has picked them up this week and is going to start.  Patient reports that she has stopped her OCD compulsions of wanting to overtaxed and to reach out to individuals and have irritability and agitation whenever not responded to.  The patient reports that that has all stop and now she feels like all the energy is internalized and she is having anxiety and fidgetiness.  Patient is on Lamictal  150 mg once daily that was reduced recently.  Patient was also on Zyprexa  7.5 mg once daily and could be due to the dopamine push from the medication in which based on this evaluation and assessment it is recommended for the patient to reduce to 5 mg once daily of Zyprexa .  Patient reports that things are going well at home stating that she has started talking to her dad again but states that she is concerned as she did have up hallucination episode recently in which she admits understood that a white car was in front of her house when he actually was not.  Patient reports that she was able to resolve this issue with her dad and states that she is on good terms with her father again.  Patient reports she has been focusing on using her energy to clean her house and pain in her house in which she has able to cover the whole house.  She patient has not been applying for new jobs and states that she is trying to get herself feeling better before pursuing that.  Patient reports that she is looking forward to trying to find medications that will help her not be so energetic in which based on this assessment interview  is recommended for the patient to decrease his Zyprexa  to 5 mg once daily.  Patient will also continue Lamictal  150 mg once daily for bipolar.  Patient will start on Cogentin  0.5 mg twice a day as needed for akathisia and restlessness.  Patient is going to discontinue hydroxyzine  as she is starting Cogentin  to avoid anticholinergic symptoms.  The patient will continue taking gabapentin  300 mg twice a day.  Patient with no other questions or concerns at this time.  Patient is going take the medications as prescribed and will follow up in 1 week to determine medication management.  Patient is in agreement with treatment plan.  Patient denies any SI, HI, AVH.  Patient with no questions or concerns at this time.  Patient will follow up in 1 week in person.  Visit Diagnosis:    ICD-10-CM   1. Bipolar disorder with severe depression (HCC)  F31.4 OLANZapine  (ZYPREXA ) 5 MG tablet    2. Generalized anxiety disorder  F41.1     3. Mixed obsessional thoughts and acts  F42.2       Past Psychiatric History:  Previous Psych Hospitalizations: Denies   Outpatient treatment: Seen a psychiatrist in 2017, unable to recall the name, currently being manages with primary care with [facility].   Medications Current: -Lamictal  150mg  once nightly, for bipolar depression -Cogentin  0.5mg  BID -Zyprexa  5mg  once a day before bed, for  hallucinations.  -Gabapentin  300mg  BID for anxeity   Medication Trials: - Latuda 40mg , side effects, stopped taking after 8 months, nausea vomiting - Seroquel - 2013, took 2 doses after waking up after taking medications and becoming violent. - Klonopin - 2018, took too many doses in one day, and then encountered stressors and attempted to take a overdose to commit suicide, in which she did not go to the ER, but she reports she took a handful.    Suicide & Violence: - Previous suicide attempt in 2018, did not go to the hospital.   Psychotherapy: Not currently participating in  therapy.     Legal: Denies  Past Medical History:  Past Medical History:  Diagnosis Date   Anxiety    Asthma    Bipolar 1 disorder (HCC)    CIN III (cervical intraepithelial neoplasia III)    Depression    History of LEEP (loop electrosurgical excision procedure) of cervix complicating pregnancy, second trimester    2012   Vaginal Pap smear, abnormal     Past Surgical History:  Procedure Laterality Date   CHOLECYSTECTOMY     TONSILLECTOMY     TYMPANOSTOMY TUBE PLACEMENT     childhood   URETERAL STENT PLACEMENT      Family Psychiatric History: No additional  Family History: History reviewed. No pertinent family history.  Social History:  Social History   Socioeconomic History   Marital status: Divorced    Spouse name: Not on file   Number of children: 2   Years of education: 12   Highest education level: Some college, no degree  Occupational History   Not on file  Tobacco Use   Smoking status: Former    Current packs/day: 0.00    Types: Cigarettes    Quit date: 06/10/2020    Years since quitting: 4.0   Smokeless tobacco: Never  Vaping Use   Vaping status: Never Used  Substance and Sexual Activity   Alcohol use: Not Currently    Alcohol/week: 3.0 standard drinks of alcohol    Types: 3 Standard drinks or equivalent per week   Drug use: Not Currently    Types: Marijuana, Cocaine   Sexual activity: Not Currently    Partners: Male    Birth control/protection: Implant  Other Topics Concern   Not on file  Social History Narrative   Not on file   Social Drivers of Health   Financial Resource Strain: Not on file  Food Insecurity: Not on file  Transportation Needs: Not on file  Physical Activity: Not on file  Stress: Not on file  Social Connections: Not on file    Allergies: No Known Allergies  Metabolic Disorder Labs: No results found for: HGBA1C, MPG No results found for: PROLACTIN No results found for: CHOL, TRIG, HDL, CHOLHDL,  VLDL, LDLCALC Lab Results  Component Value Date   TSH 2.16 08/13/2012    Therapeutic Level Labs: No results found for: LITHIUM No results found for: VALPROATE No results found for: CBMZ  Current Medications: Current Outpatient Medications  Medication Sig Dispense Refill   albuterol  (PROVENTIL ) (2.5 MG/3ML) 0.083% nebulizer solution Take 3 mLs (2.5 mg total) by nebulization every 6 (six) hours as needed for wheezing or shortness of breath. 75 mL 12   benztropine  (COGENTIN ) 0.5 MG tablet Take 2 tablets (1 mg total) by mouth 2 (two) times daily. 60 tablet 0   etonogestrel  (NEXPLANON ) 68 MG IMPL implant 1 each by Subdermal route once.     gabapentin  (NEURONTIN )  300 MG capsule Take 1 capsule (300 mg total) by mouth 2 (two) times daily. 180 capsule 1   hydrOXYzine  (ATARAX ) 25 MG tablet Take 1 tablet (25 mg total) by mouth 3 (three) times daily. 30 tablet 2   lamoTRIgine  (LAMICTAL ) 150 MG tablet Take 1 tablet (150 mg total) by mouth daily. 30 tablet 0   loperamide  (IMODIUM ) 2 MG capsule Take 1 capsule (2 mg total) by mouth 4 (four) times daily as needed for diarrhea or loose stools. 12 capsule 0   methylPREDNISolone  (MEDROL  DOSEPAK) 4 MG TBPK tablet Take 6 pills on day one then decrease by 1 pill each day 21 tablet 0   OLANZapine  (ZYPREXA ) 7.5 MG tablet Take 1 tablet (7.5 mg total) by mouth at bedtime. 90 tablet 1   ondansetron  (ZOFRAN -ODT) 4 MG disintegrating tablet Take 1 tablet (4 mg total) by mouth every 8 (eight) hours as needed for nausea or vomiting. 20 tablet 0   traMADol  (ULTRAM ) 50 MG tablet Take 1 tablet (50 mg total) by mouth every 6 (six) hours as needed. 15 tablet 0   No current facility-administered medications for this visit.     Musculoskeletal: Strength & Muscle Tone: within normal limits Gait & Station: normal Patient leans: N/A  Psychiatric Specialty Exam: Review of Systems  Constitutional: Negative.   HENT: Negative.    Eyes: Negative.   Respiratory:  Negative.    Cardiovascular: Negative.   Gastrointestinal: Negative.   Endocrine: Negative.   Genitourinary: Negative.   Musculoskeletal: Negative.   Allergic/Immunologic: Negative.   Neurological: Negative.   Hematological: Negative.   Psychiatric/Behavioral:  Positive for behavioral problems. The patient is hyperactive.     Blood pressure 121/87, pulse 66, temperature (!) 97.5 F (36.4 C), temperature source Temporal, height 5' 4.25 (1.632 m), weight 163 lb 3.2 oz (74 kg), last menstrual period 07/07/2024.Body mass index is 27.8 kg/m.  General Appearance: Well Groomed  Eye Contact:  Good  Speech:  Pressured  Volume:  Increased  Mood:  Anxious and Depressed  Affect:  Appropriate  Thought Process:  Coherent  Orientation:  Full (Time, Place, and Person)  Thought Content: Logical   Suicidal Thoughts:  No  Homicidal Thoughts:  No  Memory:  Immediate;   Good Recent;   Good Remote;   Good  Judgement:  Good  Insight:  Good  Psychomotor Activity:  Normal  Concentration:  Concentration: Good and Attention Span: Good  Recall:  Good  Fund of Knowledge: Good  Language: Good  Akathisia:  No  Handed:  Right  AIMS (if indicated): not done  Assets:  Desire for Improvement Financial Resources/Insurance Housing  ADL's:  Intact  Cognition: WNL  Sleep:  Good   Screenings: GAD-7    Flowsheet Row Office Visit from 07/07/2024 in [facility] Office Visit from 06/09/2024 in [facility] Office Visit from 05/25/2024 in [facility] Office Visit from 05/11/2024 in [facility] Office Visit from 04/29/2024 in [facility]  Total GAD-7 Score 15 8 15 8 21    PHQ2-9    Flowsheet Row Office Visit from 07/07/2024 in [facility] Office Visit from 06/09/2024 in [facility] Office Visit from 05/25/2024 in [facility] Office Visit from 05/11/2024 in [facility] Office Visit from 04/29/2024 in [facility]  PHQ-2  Total Score 4 2 2 3 6   PHQ-9 Total Score 16 7 13 11 26    Flowsheet Row Office Visit from 07/07/2024 in [facility] Office Visit from 06/09/2024 in [facility] Office Visit from 05/25/2024 in [facility]  C-SSRS RISK CATEGORY No Risk No Risk No Risk     Assessment and Plan:  Assessment - Diagnosis: Bipolar disorder with severe depression (HCC) [F31.4]  2. Generalized anxiety disorder [F41.1]  3. Mixed obsessional thoughts and acts [F42.2]  4. Hallucinations [R44.3]   Differential Diagnosis: Borderline Personality Disorder, recurrent abandonment   - Progress: Patient reporting that she her symptoms are greatly improving stating no auditory hallucinations as well as no sleeping issues stating that she is sleeping from 10 to 7 AM every night.  Patient is reporting improvement in her daily life but states that she has continued to have anxiety. - Risk Factors: Suicidal risk, worsening symptoms  Plan - Medications:  Continue Lamictal  to 150 mg by mouth once daily.  Patient was educated on the importance of monitoring for rashes and to stop taking medication should a rash develop and to contact the clinic.  Patient also reported nausea and vomiting as well as headache and sedation advised to take at night as well as to wait for medication on the symptoms, please notify clinic if symptoms last longer than 1 week. Decrease to Zyprexa  5mg  once a day at night for hallucination and sleep.  Pt reports having auditory and visual hallucinations. Pt educated about Tardive dyskinesia and to  monitor for new abnormal or uncontrollable movements.    Continue Cogentin  0.5mg  BID for agitation, fidgety, and restlessness.  Continue Gabapentin  300mg  BID for anxiety, she has been educated on the sedating nature of the medication and advised to take the prescription only as instructed. - Psychotherapy: Patient placed on a wait list here at [facility] and is waiting for in person sessions. - Education: Patient educated on importance of monitoring for rationing with Lamictal .  Patient educated on suicide risk in which she agrees that she will call 911 or go to emergency department should she have suicidal thoughts with or without a plan.  To educate on behaviors and to continue journaling and monitoring of her behaviors regarding texting which she states has been pushing people away.  Advised to follow the recommendation and treatment plan of the therapists while working around these OCD tendencies and behaviors. Discuss case with Dr. Curry regarding restlessness and energy.  - Follow-Up: Patient will follow up in 2 weeks - Referrals: Therapy referral was provided. - Safety Planning: Patient was educated on her depression and to notify the clinic should she have worsening symptoms.  Patient also notified that should she have suicidal ideation with or without a plan to call 911 or go to the closest emergency department.  Patient denies having a firearm within the home.    Patient/Guardian was advised Release of Information must be obtained prior to any record release in order to collaborate their care with an outside provider. Patient/Guardian was advised if they have not already done so to contact the registration department to sign all necessary forms in order for us  to release information regarding their care.   Consent: Patient/Guardian gives verbal consent for treatment and assignment of benefits for services provided during this visit. Patient/Guardian expressed understanding and agreed to proceed.     Dorn Jama Der, NP 07/14/2024, 11:02 AM

## 2024-07-20 ENCOUNTER — Encounter: Payer: Self-pay | Admitting: Psychiatry

## 2024-07-20 ENCOUNTER — Ambulatory Visit (INDEPENDENT_AMBULATORY_CARE_PROVIDER_SITE_OTHER): Admitting: Psychiatry

## 2024-07-20 VITALS — BP 122/78 | HR 76 | Temp 98.3°F | Ht 64.25 in | Wt 161.2 lb

## 2024-07-20 DIAGNOSIS — F411 Generalized anxiety disorder: Secondary | ICD-10-CM | POA: Diagnosis not present

## 2024-07-20 DIAGNOSIS — F422 Mixed obsessional thoughts and acts: Secondary | ICD-10-CM

## 2024-07-20 DIAGNOSIS — F314 Bipolar disorder, current episode depressed, severe, without psychotic features: Secondary | ICD-10-CM

## 2024-07-20 NOTE — Progress Notes (Cosign Needed)
 BH MD/PA/NP OP Progress Note  07/20/2024 11:04 AM ODALY PERI  MRN:  [id number]  Chief Complaint:  Chief Complaint  Patient presents with   Follow-up   HPI: 38 year old female presenting to [facility] for follow-up.  Patient presents today stating that she is over sedated stating that the Cogentin  is a little too overwhelming with the gabapentin  stating that she is feeling no drive and is unable to stay outside.  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  Patient reports that she is currently still having anxiety and states that is not improved at all.  Based on the patient's observation during the last several assessments of distractibility, OCD-like tendencies, and her hyperactivity is recommended for the patient to be evaluated for ADHD at the clinic.  Patient will also start her medications soon after evaluation on current medication regimen which she stopped the Cogentin  and gabapentin  due to oversedation and has feelings that is not improving her condition.  Patient will be evaluated for possible ADHD and treatment.  Patient has been investigated whether or not the ADHD is causing her anxiety in which treatment will follow soup and addressing the ADHD with not stimulants or stimulants and at evaluated with therapy.  Patient was educated on this possibility when she is in agreement.  Patient with no other questions or concerns at this time PDMP and has been reviewed.  Patient denies SI, HI, AVH.  Patient will follow up in 1 week.   Patient has been instructed to stop Cogentin  and gabapentin .  Patient will continue taking Lamictal  150 mg once daily and Zyprexa  5 mg once daily for sleep at bipolar disorder.  I personally spent a total of 40 minutes in the care of the patient today including preparing to see the  patient, getting/reviewing separately obtained history, performing a medically appropriate exam/evaluation, counseling and educating, referring and communicating with other health care professionals, documenting clinical information in the EHR, and coordinating care.  Visit Diagnosis:    ICD-10-CM   1. Bipolar disorder with severe depression (HCC)  F31.4     2. Generalized anxiety disorder  F41.1     3. Mixed obsessional thoughts and acts  F42.2       Past Psychiatric History:  Previous Psych Hospitalizations: Denies   Outpatient treatment: Seen a psychiatrist in 2017, unable to recall the name, currently being manages with primary care with [facility].   Medications Current: -Lamictal  150mg  once nightly, for bipolar depression -Zyprexa  5mg  once a day before bed, for hallucinations.    Medication Trials: - Latuda 40mg , side effects, stopped taking after 8 months, nausea vomiting - Seroquel - 2013, took 2 doses after waking up after taking medications and becoming violent. - Klonopin - 2018, took too many doses in one day, and then encountered stressors and attempted to take a overdose to commit suicide, in which she did not go to the ER, but she reports she took a handful.  -Gabapentin  300mg  BID, 07/20/24, poor response - Cogentin  0,5mg  BID, 07/20/24, oversedating   Suicide & Violence: - Previous suicide attempt in 2018, did not go to the hospital.   Psychotherapy: Not currently participating in therapy.     Legal: Denies  Past Medical History:  Past Medical History:  Diagnosis Date   Anxiety    Asthma  Bipolar 1 disorder (HCC)    CIN III (cervical intraepithelial neoplasia III)    Depression    History of LEEP (loop electrosurgical excision procedure) of cervix complicating pregnancy, second trimester    2012   Vaginal Pap smear, abnormal     Past Surgical History:  Procedure Laterality Date   CHOLECYSTECTOMY     TONSILLECTOMY     TYMPANOSTOMY TUBE PLACEMENT      childhood   URETERAL STENT PLACEMENT      Family Psychiatric History: No additional  Family History: History reviewed. No pertinent family history.  Social History:  Social History   Socioeconomic History   Marital status: Divorced    Spouse name: Not on file   Number of children: 2   Years of education: 12   Highest education level: Some college, no degree  Occupational History   Not on file  Tobacco Use   Smoking status: Former    Current packs/day: 0.00    Types: Cigarettes    Quit date: 06/10/2020    Years since quitting: 4.1   Smokeless tobacco: Never  Vaping Use   Vaping status: Never Used  Substance and Sexual Activity   Alcohol use: Not Currently    Alcohol/week: 3.0 standard drinks of alcohol    Types: 3 Standard drinks or equivalent per week   Drug use: Not Currently    Types: Marijuana, Cocaine   Sexual activity: Not Currently    Partners: Male    Birth control/protection: Implant  Other Topics Concern   Not on file  Social History Narrative   Not on file   Social Drivers of Health   Financial Resource Strain: Not on file  Food Insecurity: Not on file  Transportation Needs: Not on file  Physical Activity: Not on file  Stress: Not on file  Social Connections: Not on file    Allergies: No Known Allergies  Metabolic Disorder Labs: No results found for: HGBA1C, MPG No results found for: PROLACTIN No results found for: CHOL, TRIG, HDL, CHOLHDL, VLDL, LDLCALC Lab Results  Component Value Date   TSH 2.16 08/13/2012    Therapeutic Level Labs: No results found for: LITHIUM No results found for: VALPROATE No results found for: CBMZ  Current Medications: Current Outpatient Medications  Medication Sig Dispense Refill   albuterol  (PROVENTIL ) (2.5 MG/3ML) 0.083% nebulizer solution Take 3 mLs (2.5 mg total) by nebulization every 6 (six) hours as needed for wheezing or shortness of breath. 75 mL 12   benztropine  (COGENTIN ) 0.5  MG tablet Take 2 tablets (1 mg total) by mouth 2 (two) times daily. 60 tablet 0   etonogestrel  (NEXPLANON ) 68 MG IMPL implant 1 each by Subdermal route once.     gabapentin  (NEURONTIN ) 300 MG capsule Take 1 capsule (300 mg total) by mouth 2 (two) times daily. 180 capsule 1   lamoTRIgine  (LAMICTAL ) 150 MG tablet Take 1 tablet (150 mg total) by mouth daily. 30 tablet 0   loperamide  (IMODIUM ) 2 MG capsule Take 1 capsule (2 mg total) by mouth 4 (four) times daily as needed for diarrhea or loose stools. 12 capsule 0   methylPREDNISolone  (MEDROL  DOSEPAK) 4 MG TBPK tablet Take 6 pills on day one then decrease by 1 pill each day 21 tablet 0   OLANZapine  (ZYPREXA ) 5 MG tablet Take 1 tablet (5 mg total) by mouth at bedtime. 30 tablet 0   ondansetron  (ZOFRAN -ODT) 4 MG disintegrating tablet Take 1 tablet (4 mg total) by mouth every 8 (eight) hours as  needed for nausea or vomiting. 20 tablet 0   traMADol  (ULTRAM ) 50 MG tablet Take 1 tablet (50 mg total) by mouth every 6 (six) hours as needed. 15 tablet 0   No current facility-administered medications for this visit.     Musculoskeletal: Strength & Muscle Tone: within normal limits Gait & Station: normal Patient leans: N/A  Psychiatric Specialty Exam: Review of Systems  Constitutional: Negative.   HENT: Negative.    Eyes: Negative.   Respiratory: Negative.    Cardiovascular: Negative.   Gastrointestinal: Negative.   Endocrine: Negative.   Genitourinary: Negative.   Musculoskeletal: Negative.   Skin: Negative.   Allergic/Immunologic: Negative.   Neurological: Negative.   Hematological: Negative.   Psychiatric/Behavioral:  Positive for dysphoric mood. The patient is nervous/anxious.     Blood pressure 122/78, pulse 76, temperature 98.3 F (36.8 C), temperature source Temporal, height 5' 4.25 (1.632 m), weight 161 lb 3.2 oz (73.1 kg), last menstrual period 07/07/2024, SpO2 99%.Body mass index is 27.46 kg/m.  General Appearance: Well Groomed   Eye Contact:  Good  Speech:  Clear and Coherent  Volume:  Normal  Mood:  Anxious and Depressed  Affect:  Appropriate  Thought Process:  Coherent  Orientation:  Full (Time, Place, and Person)  Thought Content: Logical   Suicidal Thoughts:  No  Homicidal Thoughts:  No  Memory:  Immediate;   Good Recent;   Good Remote;   Good  Judgement:  Fair  Insight:  Lacking  Psychomotor Activity:  Normal  Concentration:  Concentration: Fair and Attention Span: Fair  Recall:  Good  Fund of Knowledge: Good  Language: Good  Akathisia:  No  Handed:  Right  AIMS (if indicated):   Assets:  Desire for Improvement Financial Resources/Insurance Housing  ADL's:  Intact  Cognition: WNL  Sleep:  Good   Screenings: GAD-7    Flowsheet Row Office Visit from 07/07/2024 in [facility] Visit from 06/09/2024 in [facility] Office Visit from 05/25/2024 in [facility] Office Visit from 05/11/2024 in [facility] Office Visit from 04/29/2024 in [facility]  Total GAD-7 Score 15 8 15 8 21    PHQ2-9    Flowsheet Row Office Visit from 07/07/2024 in [facility] Visit from 06/09/2024 in [facility] Visit from 05/25/2024 in [facility] Visit from 05/11/2024 in [facility] Visit from 04/29/2024 in [facility]  PHQ-2 Total Score 4 2 2 3 6   PHQ-9 Total Score 16 7 13 11 26    Flowsheet Row Office Visit from 07/07/2024 in [facility] Office Visit from 06/09/2024 in [facility] Office Visit from 05/25/2024 in [facility]  C-SSRS RISK CATEGORY No Risk No Risk No Risk     Assessment and Plan:  Assessment - Diagnosis: Bipolar disorder with severe depression (HCC) [F31.4]  2. Generalized anxiety disorder [F41.1]  3. Mixed obsessional thoughts and acts [F42.2]  4. Hallucinations [R44.3]   Differential Diagnosis: Borderline Personality Disorder, recurrent abandonment   - Progress: Patient reporting that she her symptoms are greatly improving stating no auditory hallucinations as well as no sleeping issues stating that she is sleeping from 10 to 7 AM every night.  Patient is reporting improvement  in her daily life but states that she has continued to have anxiety. - Risk Factors: Suicidal risk, worsening symptoms  Plan - Medications:  Continue Lamictal  to 150 mg by mouth once daily.  Patient was educated on the importance of monitoring for rashes and to stop taking medication should a rash develop and to contact the clinic.  Patient also reported nausea and vomiting as well as headache and sedation advised to take at night as well as to wait for medication on the symptoms, please notify clinic if symptoms last longer than 1 week. Continue to Zyprexa  5mg  once a day at night for hallucination and sleep.  Pt reports having auditory and visual hallucinations. Pt educated about Tardive dyskinesia and to monitor for new abnormal or uncontrollable movements.    Stop Cogentin , due to over sedation Stop Gabapentin , pt feels she is not getting any benefit. Stopped on her own.  - Psychotherapy: Patient placed on a wait list here at [facility] and is waiting for in person sessions. - Education: Patient educated on importance of monitoring for rationing with Lamictal .  Patient educated on suicide risk in which she agrees that she will call 911 or go to emergency department should she have suicidal thoughts with or without a plan.  To educate on behaviors and to continue journaling and  monitoring of her behaviors regarding texting which she states has been pushing people away.  Advised to follow the recommendation and treatment plan of the therapists while working around these OCD tendencies and behaviors. Discuss case with Dr. Curry regarding restlessness and energy.  - Follow-Up: Patient will follow up in 2 weeks - Referrals: [facility] for ADHD testing - Safety Planning: Patient was educated on her depression and to notify the clinic should she have worsening symptoms.  Patient also notified that should she have suicidal ideation with or without a plan to call 911 or go to the closest emergency department.  Patient denies having a firearm within the home.  Patient/Guardian was advised Release of Information must be obtained prior to any record release in order to collaborate their care with an outside provider. Patient/Guardian was advised if they have not already done so to contact the registration department to sign all necessary forms in order for us  to release information regarding their care.   Consent: Patient/Guardian gives verbal consent for treatment and assignment of benefits for services provided during this visit. Patient/Guardian expressed understanding and agreed to proceed.    Dorn Jama Der, NP 07/20/2024, 11:04 AM

## 2024-07-27 ENCOUNTER — Ambulatory Visit (INDEPENDENT_AMBULATORY_CARE_PROVIDER_SITE_OTHER): Admitting: Psychiatry

## 2024-07-27 ENCOUNTER — Encounter: Payer: Self-pay | Admitting: Psychiatry

## 2024-07-27 VITALS — BP 118/72 | HR 73 | Temp 98.5°F | Ht 64.25 in | Wt 160.2 lb

## 2024-07-27 DIAGNOSIS — F314 Bipolar disorder, current episode depressed, severe, without psychotic features: Secondary | ICD-10-CM | POA: Diagnosis not present

## 2024-07-27 DIAGNOSIS — F411 Generalized anxiety disorder: Secondary | ICD-10-CM | POA: Diagnosis not present

## 2024-07-27 DIAGNOSIS — F422 Mixed obsessional thoughts and acts: Secondary | ICD-10-CM

## 2024-07-27 DIAGNOSIS — R443 Hallucinations, unspecified: Secondary | ICD-10-CM

## 2024-07-27 NOTE — Progress Notes (Signed)
 BH MD/PA/NP OP Progress Note  07/27/2024 11:00 AM Wendy Holmes  MRN:  [id number]  Chief Complaint:  Chief Complaint  Patient presents with   Follow-up   HPI: 38 year old female presenting [facility] for follow-up.  This week patient presents stating that she is having hallucinations again at night states she can hear stuff and this was after using the Zyprexa  dose in which she was not having any hallucinations at the 7.5 mg once daily.  Patient reports that she is still feeling anxious and still feeling nervous and fidgety and wishes and is seen here in office as she sits for the interview.  Patient also reports that she is not sleeping well stating that she is waking up every other hour and states that she feels she needs more help to sleep.  Patient was shared how she slept when on gabapentin  in which she shared that she was doing well on gabapentin  and would like to consider going back on it.  Based on this assessment interview is recommended for the patient to increase Zyprexa  back to 7.5 mg once daily for sleep, and continue Lamictal  150 mg once daily for bipolar.  Patient is still being investigated for possible ADHD in which after medication is stabilized with the current changes patient will be considered for ADHD medications nonstimulant first for therapy.  Patient is in agreement with treatment plan.  Patient with no other questions or concerns at this time.  Patient denies SI, HI.  Patient will follow up in 1 week. Visit Diagnosis:    ICD-10-CM   1. Bipolar disorder with severe depression (HCC)  F31.4     2. Generalized anxiety disorder  F41.1     3. Mixed obsessional thoughts and acts  F42.2     4. Hallucinations  R44.3       Past Psychiatric History:  Previous Psych Hospitalizations: Denies   Outpatient treatment: Seen a psychiatrist in 2017, unable to recall the name, currently being manages with primary care with [facility].   Medications Current: -Lamictal  150mg  once  nightly, for bipolar depression -Zyprexa  7.5mg  once a day before bed, for hallucinations.  -Gabapentin  300mg  once day at night for sleep and anxiety.    Medication Trials: - Latuda 40mg , side effects, stopped taking after 8 months, nausea vomiting - Seroquel - 2013, took 2 doses after waking up after taking medications and becoming violent. - Klonopin - 2018, took too many doses in one day, and then encountered stressors and attempted to take a overdose to commit suicide, in which she did not go to the ER, but she reports she took a handful.  -Gabapentin  300mg  BID, 07/20/24, poor response - Cogentin  0,5mg  BID, 07/20/24, oversedating   Suicide & Violence: - Previous suicide attempt in 2018, did not go to the hospital.   Psychotherapy: Not currently participating in therapy.     Legal: Denies    Past Medical History:  Past Medical History:  Diagnosis Date   Anxiety    Asthma    Bipolar 1 disorder (HCC)    CIN III (cervical intraepithelial neoplasia III)    Depression    History of LEEP (loop electrosurgical excision procedure) of cervix complicating pregnancy, second trimester    2012   Vaginal Pap smear, abnormal     Past Surgical History:  Procedure Laterality Date   CHOLECYSTECTOMY     TONSILLECTOMY     TYMPANOSTOMY TUBE PLACEMENT     childhood   URETERAL STENT PLACEMENT  Family Psychiatric History: No additional  Family History: History reviewed. No pertinent family history.  Social History:  Social History   Socioeconomic History   Marital status: Divorced    Spouse name: Not on file   Number of children: 2   Years of education: 12   Highest education level: Some college, no degree  Occupational History   Not on file  Tobacco Use   Smoking status: Former    Current packs/day: 0.00    Types: Cigarettes    Quit date: 06/10/2020    Years since quitting: 4.1   Smokeless tobacco: Never  Vaping Use   Vaping status: Never Used  Substance and Sexual  Activity   Alcohol use: Not Currently    Alcohol/week: 3.0 standard drinks of alcohol    Types: 3 Standard drinks or equivalent per week   Drug use: Not Currently    Types: Marijuana, Cocaine   Sexual activity: Not Currently    Partners: Male    Birth control/protection: Implant  Other Topics Concern   Not on file  Social History Narrative   Not on file   Social Drivers of Health   Financial Resource Strain: Not on file  Food Insecurity: Not on file  Transportation Needs: Not on file  Physical Activity: Not on file  Stress: Not on file  Social Connections: Not on file    Allergies: No Known Allergies  Metabolic Disorder Labs: No results found for: HGBA1C, MPG No results found for: PROLACTIN No results found for: CHOL, TRIG, HDL, CHOLHDL, VLDL, LDLCALC Lab Results  Component Value Date   TSH 2.16 08/13/2012    Therapeutic Level Labs: No results found for: LITHIUM No results found for: VALPROATE No results found for: CBMZ  Current Medications: Current Outpatient Medications  Medication Sig Dispense Refill   albuterol  (PROVENTIL ) (2.5 MG/3ML) 0.083% nebulizer solution Take 3 mLs (2.5 mg total) by nebulization every 6 (six) hours as needed for wheezing or shortness of breath. 75 mL 12   benztropine  (COGENTIN ) 0.5 MG tablet Take 2 tablets (1 mg total) by mouth 2 (two) times daily. 60 tablet 0   etonogestrel  (NEXPLANON ) 68 MG IMPL implant 1 each by Subdermal route once.     gabapentin  (NEURONTIN ) 300 MG capsule Take 1 capsule (300 mg total) by mouth 2 (two) times daily. 180 capsule 1   lamoTRIgine  (LAMICTAL ) 150 MG tablet Take 1 tablet (150 mg total) by mouth daily. 30 tablet 0   loperamide  (IMODIUM ) 2 MG capsule Take 1 capsule (2 mg total) by mouth 4 (four) times daily as needed for diarrhea or loose stools. 12 capsule 0   methylPREDNISolone  (MEDROL  DOSEPAK) 4 MG TBPK tablet Take 6 pills on day one then decrease by 1 pill each day 21 tablet 0    OLANZapine  (ZYPREXA ) 5 MG tablet Take 1 tablet (5 mg total) by mouth at bedtime. 30 tablet 0   ondansetron  (ZOFRAN -ODT) 4 MG disintegrating tablet Take 1 tablet (4 mg total) by mouth every 8 (eight) hours as needed for nausea or vomiting. 20 tablet 0   traMADol  (ULTRAM ) 50 MG tablet Take 1 tablet (50 mg total) by mouth every 6 (six) hours as needed. 15 tablet 0   No current facility-administered medications for this visit.     Musculoskeletal: Strength & Muscle Tone: within normal limits Gait & Station: normal Patient leans: N/A  Psychiatric Specialty Exam: Review of Systems  Constitutional: Negative.   HENT: Negative.    Eyes: Negative.   Respiratory: Negative.  Cardiovascular: Negative.   Gastrointestinal: Negative.   Endocrine: Negative.   Genitourinary: Negative.   Musculoskeletal: Negative.   Skin: Negative.   Allergic/Immunologic: Negative.   Neurological: Negative.   Hematological: Negative.   Psychiatric/Behavioral:  Positive for decreased concentration and dysphoric mood. The patient is nervous/anxious.     Blood pressure 118/72, pulse 73, temperature 98.5 F (36.9 C), temperature source Temporal, height 5' 4.25 (1.632 m), weight 160 lb 3.2 oz (72.7 kg), last menstrual period 07/07/2024, SpO2 91%.Body mass index is 27.28 kg/m.  General Appearance: Well Groomed  Eye Contact:  Good  Speech:  Clear and Coherent  Volume:  Normal  Mood:  Anxious and Depressed  Affect:  Depressed  Thought Process:  Coherent  Orientation:  Full (Time, Place, and Person)  Thought Content: Logical   Suicidal Thoughts:  No  Homicidal Thoughts:  No  Memory:  Immediate;   Good Recent;   Good Remote;   Good  Judgement:  Good  Insight:  Good  Psychomotor Activity:  Normal  Concentration:  Concentration: Poor and Attention Span: Poor  Recall:  Good  Fund of Knowledge: Good  Language: Good  Akathisia:  No  Handed:  Right  AIMS (if indicated):   Assets:  Desire for  Improvement Financial Resources/Insurance Housing  ADL's:  Intact  Cognition: WNL  Sleep:  Good   Screenings: GAD-7    Flowsheet Row Office Visit from 07/07/2024 in [facility] Office Visit from 06/09/2024 in [facility] Office Visit from 05/25/2024 in [facility] Office Visit from 05/11/2024 in [facility] Office Visit from 04/29/2024 in [facility]  Total GAD-7 Score 15 8 15 8 21    PHQ2-9    Flowsheet Row Office Visit from 07/07/2024 in [facility] Office Visit from 06/09/2024 in [facility] Office Visit from 05/25/2024 in [facility] Office Visit from 05/11/2024 in [facility] Office Visit from 04/29/2024 in [facility]  PHQ-2 Total Score 4 2 2 3 6   PHQ-9 Total Score 16 7 13 11 26    Flowsheet Row Office Visit from 07/07/2024 in [facility] Office Visit from 06/09/2024 in [facility] Office Visit from 05/25/2024 in [facility]  C-SSRS RISK CATEGORY No Risk No Risk No Risk     Assessment and Plan:  Assessment - Diagnosis: Bipolar disorder with severe depression (HCC) [F31.4]  2. Generalized anxiety disorder [F41.1]  3. Mixed obsessional thoughts and acts [F42.2]  4. Hallucinations [R44.3]   Differential Diagnosis: Borderline Personality Disorder, recurrent abandonment   - Progress: Patient reporting that she her symptoms are greatly improving stating no auditory hallucinations as well as no sleeping issues stating that she is sleeping from 10 to 7 AM every night.  Patient is  reporting improvement in her daily life but states that she has continued to have anxiety. - Risk Factors: Suicidal risk, worsening symptoms  Plan - Medications:  Continue Lamictal  to 150 mg by mouth once daily.  Patient was educated on the importance of monitoring for rashes and to stop taking medication should a rash develop and to contact the clinic.  Patient also reported nausea and vomiting as well as headache and sedation advised to take at night as well as to wait for  medication on the symptoms, please notify clinic if symptoms last longer than 1 week. Increase to Zyprexa  7.5mg  once a day at night for hallucination and sleep.  Pt reports having auditory and visual hallucinations. Pt educated about Tardive dyskinesia and to monitor for new abnormal or uncontrollable movements.    Start Gabapentin  300mg  once daily at night. - Psychotherapy: Patient placed on a wait list here at [facility] and is waiting for in person sessions. - Education: Patient educated on importance of monitoring for rationing with Lamictal .  Patient educated on suicide risk in which she agrees that she will call 911 or go to emergency department should she have suicidal thoughts with or without a plan.  To educate on behaviors and to continue journaling and monitoring of her behaviors regarding texting which she states has been pushing people away.  Advised to follow the recommendation and treatment plan of the therapists while working around these OCD tendencies and behaviors. - Follow-Up: Patient will follow up in 2 weeks - Referrals: [facility] for ADHD testing - Safety Planning: Patient was educated on her depression and to notify the clinic should she have worsening symptoms.  Patient also notified that should she have suicidal ideation with or without a plan to call 911 or go to the closest emergency department.  Patient denies having a firearm within the home.  Patient/Guardian was advised Release of Information must be  obtained prior to any record release in order to collaborate their care with an outside provider. Patient/Guardian was advised if they have not already done so to contact the registration department to sign all necessary forms in order for us  to release information regarding their care.   Consent: Patient/Guardian gives verbal consent for treatment and assignment of benefits for services provided during this visit. Patient/Guardian expressed understanding and agreed to proceed.    Dorn Jama Der, NP 07/27/2024, 11:00 AM

## 2024-08-04 ENCOUNTER — Encounter: Payer: Self-pay | Admitting: Psychiatry

## 2024-08-04 ENCOUNTER — Ambulatory Visit: Admitting: Psychiatry

## 2024-08-04 VITALS — BP 118/82 | HR 68 | Temp 98.3°F | Ht 64.25 in | Wt 170.6 lb

## 2024-08-04 DIAGNOSIS — F411 Generalized anxiety disorder: Secondary | ICD-10-CM

## 2024-08-04 DIAGNOSIS — R443 Hallucinations, unspecified: Secondary | ICD-10-CM

## 2024-08-04 DIAGNOSIS — F314 Bipolar disorder, current episode depressed, severe, without psychotic features: Secondary | ICD-10-CM | POA: Diagnosis not present

## 2024-08-04 DIAGNOSIS — F422 Mixed obsessional thoughts and acts: Secondary | ICD-10-CM

## 2024-08-04 MED ORDER — LYBALVI 5-10 MG PO TABS: 1.0000 | ORAL_TABLET | Freq: Every day | ORAL | 0 refills | Status: DC

## 2024-08-04 NOTE — Progress Notes (Addendum)
 BH MD/PA/NP OP Progress Note  08/04/2024 9:29 AM CHANNIE BOSTICK  MRN:  [id number]  Chief Complaint:  Chief Complaint  Patient presents with   Follow-up   HPI: 38 year old female presenting [facility] for follow-up.  Patient reports that she is sleeping much better now that we increase the Zyprexa  but states that she is now gaining weight and knows she has gained 30 pounds since starting here at the facility.  Patient reports that the Zyprexa  is helping but she states that she has been sleeping a lot but states that that is due to the fact that she has not a lot to do at home and states she takes frequent naps.  Patient reports that she is concerned about the weight gain and which for this reason patient is going be switched to Lybalvy due to weight gain adjustment.  Patient will start initially on 5 mg dose of lowballed he.  Prescription has been sent to [facility].  Patient verbalized understanding and agreement.  Patient also reports that she is doing well at home stating that today she is looking forward to going to the [employer] shop as she is getting to get around the city with her cousin which she has been looking forward to.  Patient is in good spirits and smiling today.  Patient reports good mood and states no complaints of hallucinations since increasing Zyprexa .  Patient denies SI, HI, AVH.  In the following week patient will pursue ADHD medications. Based on this assessment interview is recommended for the patient to continue Lamictal  to 150 mg once daily.  Patient is to switch to Lybalvi  5 mg dose.  Patient to also continue hydroxyzine  25 mg 3 times a day as needed for anxiety.  Patient is in agreement with treatment plan.  Patient with no other questions or concerns at this time.  Patient to follow up in 1 week. Visit Diagnosis:    ICD-10-CM   1. Bipolar disorder with severe depression (HCC)  F31.4     2. Generalized anxiety disorder  F41.1     3. Mixed obsessional thoughts and acts  F42.2      4. Hallucinations  R44.3       Past Psychiatric History:  Previous Psych Hospitalizations: Denies   Outpatient treatment: Seen a psychiatrist in 2017, unable to recall the name, currently being manages with primary care with [facility].   Medications Current: -Lamictal  150mg  once nightly, for bipolar depression -Lybalvi  5mg /10mg  once daily, for hallucinations.  -Hydroxyzine  25mg  TID as needed for anxiety.    Medication Trials: - Latuda 40mg , side effects, stopped taking after 8 months, nausea vomiting - Seroquel - 2013, took 2 doses after waking up after taking medications and becoming violent. - Klonopin - 2018, took too many doses in one day, and then encountered stressors and attempted to take a overdose to commit suicide, in which she did not go to the ER, but she reports she took a handful.  -Gabapentin  300mg  BID, 07/20/24, poor response - Cogentin  0,5mg  BID, 07/20/24, oversedating   Suicide & Violence: - Previous suicide attempt in 2018, did not go to the hospital.   Psychotherapy: Not currently participating in therapy.     Legal: Denies  Past Medical History:  Past Medical History:  Diagnosis Date   Anxiety    Asthma    Bipolar 1 disorder (HCC)    CIN III (cervical intraepithelial neoplasia III)    Depression    History of LEEP (loop electrosurgical excision procedure) of  cervix complicating pregnancy, second trimester    2012   Vaginal Pap smear, abnormal     Past Surgical History:  Procedure Laterality Date   CHOLECYSTECTOMY     TONSILLECTOMY     TYMPANOSTOMY TUBE PLACEMENT     childhood   URETERAL STENT PLACEMENT      Family Psychiatric History: No additional  Family History: History reviewed. No pertinent family history.  Social History:  Social History   Socioeconomic History   Marital status: Divorced    Spouse name: Not on file   Number of children: 2   Years of education: 12   Highest education level: Some college, no degree   Occupational History   Not on file  Tobacco Use   Smoking status: Former    Current packs/day: 0.00    Types: Cigarettes    Quit date: 06/10/2020    Years since quitting: 4.1   Smokeless tobacco: Never  Vaping Use   Vaping status: Never Used  Substance and Sexual Activity   Alcohol use: Not Currently    Alcohol/week: 3.0 standard drinks of alcohol    Types: 3 Standard drinks or equivalent per week   Drug use: Not Currently    Types: Marijuana, Cocaine   Sexual activity: Not Currently    Partners: Male    Birth control/protection: Implant  Other Topics Concern   Not on file  Social History Narrative   Not on file   Social Drivers of Health   Financial Resource Strain: Not on file  Food Insecurity: Not on file  Transportation Needs: Not on file  Physical Activity: Not on file  Stress: Not on file  Social Connections: Not on file    Allergies: No Known Allergies  Metabolic Disorder Labs: No results found for: HGBA1C, MPG No results found for: PROLACTIN No results found for: CHOL, TRIG, HDL, CHOLHDL, VLDL, LDLCALC Lab Results  Component Value Date   TSH 2.16 08/13/2012    Therapeutic Level Labs: No results found for: LITHIUM No results found for: VALPROATE No results found for: CBMZ  Current Medications: Current Outpatient Medications  Medication Sig Dispense Refill   albuterol  (PROVENTIL ) (2.5 MG/3ML) 0.083% nebulizer solution Take 3 mLs (2.5 mg total) by nebulization every 6 (six) hours as needed for wheezing or shortness of breath. 75 mL 12   benztropine  (COGENTIN ) 0.5 MG tablet Take 2 tablets (1 mg total) by mouth 2 (two) times daily. 60 tablet 0   etonogestrel  (NEXPLANON ) 68 MG IMPL implant 1 each by Subdermal route once.     gabapentin  (NEURONTIN ) 300 MG capsule Take 1 capsule (300 mg total) by mouth 2 (two) times daily. 180 capsule 1   lamoTRIgine  (LAMICTAL ) 150 MG tablet Take 1 tablet (150 mg total) by mouth daily. 30 tablet 0    loperamide  (IMODIUM ) 2 MG capsule Take 1 capsule (2 mg total) by mouth 4 (four) times daily as needed for diarrhea or loose stools. 12 capsule 0   methylPREDNISolone  (MEDROL  DOSEPAK) 4 MG TBPK tablet Take 6 pills on day one then decrease by 1 pill each day 21 tablet 0   OLANZapine  (ZYPREXA ) 5 MG tablet Take 1 tablet (5 mg total) by mouth at bedtime. 30 tablet 0   ondansetron  (ZOFRAN -ODT) 4 MG disintegrating tablet Take 1 tablet (4 mg total) by mouth every 8 (eight) hours as needed for nausea or vomiting. 20 tablet 0   traMADol  (ULTRAM ) 50 MG tablet Take 1 tablet (50 mg total) by mouth every 6 (six) hours as  needed. 15 tablet 0   No current facility-administered medications for this visit.     Musculoskeletal: Strength & Muscle Tone: within normal limits Gait & Station: normal Patient leans: N/A  Psychiatric Specialty Exam: Review of Systems  Constitutional: Negative.   HENT: Negative.    Eyes: Negative.   Respiratory: Negative.    Cardiovascular: Negative.   Gastrointestinal: Negative.   Endocrine: Negative.   Genitourinary: Negative.   Musculoskeletal: Negative.   Skin: Negative.   Allergic/Immunologic: Negative.   Neurological: Negative.   Hematological: Negative.   Psychiatric/Behavioral:  Positive for behavioral problems and decreased concentration. The patient is nervous/anxious and is hyperactive.     Blood pressure 118/82, pulse 68, temperature 98.3 F (36.8 C), temperature source Temporal, height 5' 4.25 (1.632 m), weight 170 lb 9.6 oz (77.4 kg), last menstrual period 07/07/2024, SpO2 100%.Body mass index is 29.06 kg/m.  General Appearance: Well Groomed  Eye Contact:  Good  Speech:  Clear and Coherent  Volume:  Normal  Mood:  Anxious  Affect:  Appropriate  Thought Process:  Coherent  Orientation:  Full (Time, Place, and Person)  Thought Content: Logical   Suicidal Thoughts:  No  Homicidal Thoughts:  No  Memory:  Immediate;   Good Recent;   Good Remote;   Good   Judgement:  Good  Insight:  Good  Psychomotor Activity:  Normal  Concentration:  Concentration: Good and Attention Span: Good  Recall:  Good  Fund of Knowledge: Good  Language: Good  Akathisia:  No  Handed:  Right  AIMS (if indicated):   Assets:  Desire for Improvement Financial Resources/Insurance Housing  ADL's:  Intact  Cognition: WNL  Sleep:  Good   Screenings: GAD-7    Flowsheet Row Office Visit from 07/07/2024 in [facility] Office Visit from 06/09/2024 in [facility] Office Visit from 05/25/2024 in [facility] Office Visit from 05/11/2024 in [facility] Office Visit from 04/29/2024 in [facility]  Total GAD-7 Score 15 8 15 8 21    PHQ2-9    Flowsheet Row Office Visit from 07/07/2024 in [facility] Office Visit from 06/09/2024 in [facility] Office Visit from 05/25/2024 in [facility] Office Visit from 05/11/2024 in [facility] Office Visit from 04/29/2024 in [facility]  PHQ-2 Total Score 4 2 2 3 6   PHQ-9 Total Score 16 7 13 11 26    Flowsheet Row Office Visit from 07/07/2024 in [facility] Office Visit from 06/09/2024 in [facility] Office Visit from 05/25/2024 in [facility]  C-SSRS RISK CATEGORY No Risk No Risk No Risk     Assessment and Plan:  Assessment - Diagnosis: Bipolar disorder with severe depression (HCC) [F31.4]  2. Generalized anxiety disorder [F41.1]  3. Mixed obsessional thoughts and acts [F42.2]  4. Hallucinations [R44.3]   Differential  Diagnosis: Borderline Personality Disorder, recurrent abandonment   - Progress: Patient reporting that she her symptoms are greatly improving stating no auditory hallucinations as well as no sleeping issues stating that she is sleeping from 10 to 7 AM every night.  Patient is reporting improvement in her daily life but states that she has continued to have anxiety. - Risk Factors: Suicidal risk, worsening symptoms  Plan - Medications:  Continue Lamictal  to 150 mg by mouth once daily.  Patient was educated on the importance of monitoring for rashes and to stop taking medication should a rash develop and to contact the clinic.  Patient also reported nausea and vomiting as well as headache and sedation advised to take at night as well as to wait for medication on the symptoms, please notify clinic if symptoms last longer than 1 week. Start Lybalvi  5mg /10mg  for hallucinations, and due to weight concerns as she has gained 30lb since starting Zyprexa .   Pt has been educated of Tardive Dyskinesia and abnormal movements, pt to call the provider if she experience symptoms and to stop the medication.  3. Continue Taking Hydroxyzine  TID as needed for anxiety.  - Psychotherapy: Patient placed on a wait list here at [facility] and is waiting for in person sessions. - Education: Patient educated on importance of monitoring for rationing with Lamictal .  Patient educated on suicide risk in which she agrees that she will call 911 or go to emergency department should she have suicidal thoughts with or without a plan.  To educate on behaviors and to continue journaling and monitoring of her behaviors regarding texting which she states has been pushing people away.  Advised to follow the recommendation and treatment plan of the therapists while working around these OCD tendencies and behaviors. - Follow-Up: Patient will follow up in 2 weeks - Referrals: [facility] for ADHD testing - Safety Planning: Patient was educated on  her depression and to notify the clinic should she have worsening symptoms.  Patient also notified that should she have suicidal ideation with or without a plan to call 911 or go to the closest emergency department.  Patient denies having a firearm within the home.   Patient/Guardian was advised Release of Information must be obtained prior to any record release in order to collaborate their care with an outside provider. Patient/Guardian was advised if they have not already done so to contact the registration department to sign all necessary forms in order for us  to release information regarding their care.   Consent: Patient/Guardian gives verbal consent for treatment and assignment of benefits for services provided during this visit. Patient/Guardian expressed understanding and agreed to proceed.    Dorn Jama Der, NP 08/04/2024, 9:29 AM

## 2024-08-09 ENCOUNTER — Telehealth: Payer: Self-pay

## 2024-08-09 NOTE — Telephone Encounter (Addendum)
 Received fax from pharmacy requesting a prior authorization for the patients Lybalvi  5-10 mg tablets initiated via CoverMyMeds pendig determination   Approved  Coverage 08-09-24//08-09-25  Patient informed of approval

## 2024-08-10 ENCOUNTER — Encounter: Payer: Self-pay | Admitting: Psychiatry

## 2024-08-10 ENCOUNTER — Ambulatory Visit (INDEPENDENT_AMBULATORY_CARE_PROVIDER_SITE_OTHER): Admitting: Psychiatry

## 2024-08-10 VITALS — BP 122/78 | HR 88 | Temp 98.1°F | Resp 16 | Ht 64.25 in | Wt 167.8 lb

## 2024-08-10 DIAGNOSIS — F314 Bipolar disorder, current episode depressed, severe, without psychotic features: Secondary | ICD-10-CM

## 2024-08-10 DIAGNOSIS — F411 Generalized anxiety disorder: Secondary | ICD-10-CM | POA: Diagnosis not present

## 2024-08-10 DIAGNOSIS — F422 Mixed obsessional thoughts and acts: Secondary | ICD-10-CM

## 2024-08-10 DIAGNOSIS — R443 Hallucinations, unspecified: Secondary | ICD-10-CM | POA: Diagnosis not present

## 2024-08-10 MED ORDER — CLONIDINE HCL ER 0.1 MG PO TB12: 0.1000 mg | ORAL_TABLET | Freq: Every day | ORAL | Status: DC

## 2024-08-10 MED ORDER — CLONIDINE HCL ER 0.1 MG PO TB12: 0.1000 mg | ORAL_TABLET | Freq: Every day | ORAL | 0 refills | Status: DC

## 2024-08-10 MED ORDER — HYDROXYZINE HCL 25 MG PO TABS
25.0000 mg | ORAL_TABLET | Freq: Three times a day (TID) | ORAL | 2 refills | Status: DC
Start: 2024-08-10 — End: 2024-10-05

## 2024-08-10 NOTE — Progress Notes (Signed)
 [facility] MD/PA/NP OP Progress Note  08/10/2024 9:41 AM YOCELINE BAZAR  MRN:  [id number]  Chief Complaint:  Chief Complaint  Patient presents with   Follow-up   HPI: 38 year old female presenting [facility] for follow-up.  Patient comes in tearful and very hyper stating that she is feeling overwhelmed as her mother is feeling like she is a burden to her stating that she could have come here later today but states that she is inconveniencing her.  Patient took offense to this and states she took a personalize she feels that she is asking everyone for help and is not able to drive due to lapse of entrance on her car.  Patient reports that she feels like a burden to her family and states that another stressor is the fact that she needs to go out shopping with her son but is unable to do so because she is unable to drive.  Patient is has been good for the last 3 weeks but states that now she is becoming very tearful and questionable whether hormones are involved in this irritability and tearful mood.  Patient reports that she has IUD in place and her arms states that ever since having IUD patient has absent menstrual cycles stating is hard to tell if this is correlated with her luteal phase.  Patient reports that she is frustrated as she feels like everything is unorganized and states that she is getting frustrated with everything.  Based on this assessment interview is recommended for the patient to continue medications as she has not picked up the Lybalvi  yet, to switch over once picked up and she will continue Lamictal  150 mg once daily, hydroxyzine  25 mg 3 times a day as needed for anxiety and lastly start clonidine  ER  0.1 mg, for ADHD like symptoms with suspicion of ADHD symptoms causing anxiety.  Patient is in agreement with treatment plan.  Patient with no other questions or concerns.  Patient to follow up in 1 week.  Visit Diagnosis:    ICD-10-CM   1. Bipolar disorder with severe depression (HCC)  F31.4      2. Generalized anxiety disorder  F41.1     3. Mixed obsessional thoughts and acts  F42.2     4. Hallucinations  R44.3       Past Psychiatric History:  Previous Psych Hospitalizations: Denies   Outpatient treatment: Seen a psychiatrist in 2017, unable to recall the name, currently being manages with primary care with [facility].   Medications Current: -Lamictal  150mg  once daily, for bipolar depression -Lybalvi  5mg /10mg  once daily, for hallucinations.  -Hydroxyzine  25mg  TID as needed for anxiety.  -Clonidine  ER 0.1mg , for ADHD   Medication Trials: - Latuda 40mg , side effects, stopped taking after 8 months, nausea vomiting - Seroquel - 2013, took 2 doses after waking up after taking medications and becoming violent. - Klonopin - 2018, took too many doses in one day, and then encountered stressors and attempted to take a overdose to commit suicide, in which she did not go to the ER, but she reports she took a handful.  -Gabapentin  300mg  BID, 07/20/24, poor response - Cogentin  0,5mg  BID, 07/20/24, oversedating -Zyprexa  7.5mg , weight gain 08/04/24   Suicide & Violence: - Previous suicide attempt in 2018, did not go to the hospital.   Psychotherapy: Not currently participating in therapy.     Legal: Denies  Past Medical History:  Past Medical History:  Diagnosis Date   Anxiety    Asthma    Bipolar  1 disorder (HCC)    CIN III (cervical intraepithelial neoplasia III)    Depression    History of LEEP (loop electrosurgical excision procedure) of cervix complicating pregnancy, second trimester    2012   Vaginal Pap smear, abnormal     Past Surgical History:  Procedure Laterality Date   CHOLECYSTECTOMY     TONSILLECTOMY     TYMPANOSTOMY TUBE PLACEMENT     childhood   URETERAL STENT PLACEMENT      Family Psychiatric History: No Additional  Family History: History reviewed. No pertinent family history.  Social History:  Social History   Socioeconomic History    Marital status: Divorced    Spouse name: Not on file   Number of children: 2   Years of education: 12   Highest education level: Some college, no degree  Occupational History   Not on file  Tobacco Use   Smoking status: Former    Current packs/day: 0.00    Types: Cigarettes    Quit date: 06/10/2020    Years since quitting: 4.1   Smokeless tobacco: Never  Vaping Use   Vaping status: Never Used  Substance and Sexual Activity   Alcohol use: Not Currently    Alcohol/week: 3.0 standard drinks of alcohol    Types: 3 Standard drinks or equivalent per week   Drug use: Not Currently    Types: Marijuana, Cocaine   Sexual activity: Not Currently    Partners: Male    Birth control/protection: Implant  Other Topics Concern   Not on file  Social History Narrative   Not on file   Social Drivers of Health   Financial Resource Strain: Not on file  Food Insecurity: Not on file  Transportation Needs: Not on file  Physical Activity: Not on file  Stress: Not on file  Social Connections: Not on file    Allergies: No Known Allergies  Metabolic Disorder Labs: No results found for: HGBA1C, MPG No results found for: PROLACTIN No results found for: CHOL, TRIG, HDL, CHOLHDL, VLDL, LDLCALC Lab Results  Component Value Date   TSH 2.16 08/13/2012    Therapeutic Level Labs: No results found for: LITHIUM No results found for: VALPROATE No results found for: CBMZ  Current Medications: Current Outpatient Medications  Medication Sig Dispense Refill   albuterol  (PROVENTIL ) (2.5 MG/3ML) 0.083% nebulizer solution Take 3 mLs (2.5 mg total) by nebulization every 6 (six) hours as needed for wheezing or shortness of breath. 75 mL 12   etonogestrel  (NEXPLANON ) 68 MG IMPL implant 1 each by Subdermal route once.     hydrOXYzine  (ATARAX ) 25 MG tablet Take 25 mg by mouth 3 (three) times daily.     lamoTRIgine  (LAMICTAL ) 150 MG tablet Take 1 tablet (150 mg total) by mouth daily.  30 tablet 0   loperamide  (IMODIUM ) 2 MG capsule Take 1 capsule (2 mg total) by mouth 4 (four) times daily as needed for diarrhea or loose stools. 12 capsule 0   methylPREDNISolone  (MEDROL  DOSEPAK) 4 MG TBPK tablet Take 6 pills on day one then decrease by 1 pill each day 21 tablet 0   OLANZapine -Samidorphan (LYBALVI ) 5-10 MG TABS Take 1 tablet by mouth daily. 30 tablet 0   ondansetron  (ZOFRAN -ODT) 4 MG disintegrating tablet Take 1 tablet (4 mg total) by mouth every 8 (eight) hours as needed for nausea or vomiting. 20 tablet 0   No current facility-administered medications for this visit.     Musculoskeletal: Strength & Muscle Tone: within normal limits Gait &  Station: normal Patient leans: N/A  Psychiatric Specialty Exam: Review of Systems  Constitutional: Negative.   HENT: Negative.    Eyes: Negative.   Respiratory: Negative.    Cardiovascular: Negative.   Gastrointestinal: Negative.   Endocrine: Negative.   Genitourinary: Negative.   Musculoskeletal: Negative.   Skin: Negative.   Allergic/Immunologic: Negative.   Neurological: Negative.   Hematological: Negative.   Psychiatric/Behavioral:  Positive for agitation, behavioral problems and dysphoric mood. The patient is nervous/anxious.     Blood pressure 122/78, pulse 88, temperature 98.1 F (36.7 C), temperature source Temporal, height 5' 4.25 (1.632 m), weight 167 lb 12.8 oz (76.1 kg), SpO2 99%.Body mass index is 28.58 kg/m.  General Appearance: Well Groomed  Eye Contact:  Good  Speech:  Clear and Coherent  Volume:  Normal  Mood:  Anxious and Depressed  Affect:  Appropriate  Thought Process:  Coherent  Orientation:  Full (Time, Place, and Person)  Thought Content: Logical   Suicidal Thoughts:  No  Homicidal Thoughts:  No  Memory:  Immediate;   Good Recent;   Good Remote;   Good  Judgement:  Good  Insight:  Good  Psychomotor Activity:  Normal  Concentration:  Concentration: Good and Attention Span: Good  Recall:   Good  Fund of Knowledge: Good  Language: Good  Akathisia:  No  Handed:  Right  AIMS (if indicated): not done  Assets:  Desire for Improvement Financial Resources/Insurance Housing  ADL's:  Intact  Cognition: WNL  Sleep:  Good   Screenings: GAD-7    Flowsheet Row Office Visit from 07/07/2024 in [facility] Office Visit from 06/09/2024 in [facility] Office Visit from 05/25/2024 in [facility] Office Visit from 05/11/2024 in [facility] Office Visit from 04/29/2024 in [facility]  Total GAD-7 Score 15 8 15 8 21    PHQ2-9    Flowsheet Row Office Visit from 07/07/2024 in [facility] Office Visit from 06/09/2024 in [facility] Office Visit from 05/25/2024 in [facility] Office Visit from 05/11/2024 in [facility] Office Visit from 04/29/2024 in [facility]  PHQ-2 Total Score 4 2 2 3 6   PHQ-9 Total Score 16 7 13 11 26    Flowsheet Row Office Visit from 07/07/2024 in [facility] Office Visit from 06/09/2024 in [facility] Office Visit from 05/25/2024 in [facility]  C-SSRS RISK CATEGORY No Risk No Risk No Risk     Assessment and Plan:  Assessment - Diagnosis: Bipolar disorder with severe depression (HCC) [F31.4]  2. Generalized anxiety disorder [F41.1]  3. Mixed obsessional thoughts and acts [F42.2]  4. Hallucinations [R44.3]   Differential Diagnosis: Borderline Personality Disorder, recurrent abandonment   - Progress: Patient reporting that she her symptoms are greatly improving  stating no auditory hallucinations as well as no sleeping issues stating that she is sleeping from 10 to 7 AM every night.  Patient is reporting improvement in her daily life but states that she has continued to have anxiety. - Risk Factors: Suicidal risk, worsening symptoms  Plan - Medications:  Continue Lamictal  to 150 mg by mouth once daily.  Patient was educated on the importance of monitoring for rashes and to stop taking medication should a rash develop  and to contact the clinic.  Patient also reported nausea and vomiting as well as headache and sedation advised to take at night as well as to wait for medication on the symptoms, please notify clinic if symptoms last longer than 1 week. Start Lybalvi  5mg /10mg  for hallucinations, and due to weight concerns as she has gained 30lb since starting Zyprexa .   Pt has been educated of Tardive Dyskinesia and abnormal movements, pt to call the provider if she experience symptoms and to stop the medication.  Start Clonidine  ER 0.1mg  once daily before bed, for ADHD like symptoms utilizing non-stimulant due to Bipolar diagnosis, pt has been educated of the risk of hypotension and instructed to chjeck BP first if she feels dizzy, sick, or lethargic.  4.  Continue Taking Hydroxyzine  TID as needed for anxiety.   - Psychotherapy: Patient placed on a wait list here at [facility] and is waiting for in person sessions. - Education: Patient educated on importance of monitoring for rashes with Lamictal .  Patient educated on suicide risk in which she agrees that she will call 911 or go to emergency department should she have suicidal thoughts with or without a plan.  To educate on behaviors and to continue journaling and monitoring of her behaviors regarding texting which she states has been pushing people away.  Advised to follow the recommendation and treatment plan of the therapists while working around these OCD tendencies and behaviors. - Follow-Up: Patient will follow up  in 2 weeks - Referrals: [facility] for ADHD testing - Safety Planning: Patient was educated on her depression and to notify the clinic should she have worsening symptoms.  Patient also notified that should she have suicidal ideation with or without a plan to call 911 or go to the closest emergency department.  Patient denies having a firearm within the home.  Patient/Guardian was advised Release of Information must be obtained prior to any record release in order to collaborate their care with an outside provider. Patient/Guardian was advised if they have not already done so to contact the registration department to sign all necessary forms in order for us  to release information regarding their care.   Consent: Patient/Guardian gives verbal consent for treatment and assignment of benefits for services provided during this visit. Patient/Guardian expressed understanding and agreed to proceed.    Dorn Jama Der, NP 08/10/2024, 9:41 AM

## 2024-08-11 ENCOUNTER — Telehealth: Payer: Self-pay

## 2024-08-11 NOTE — Telephone Encounter (Signed)
 Received fax from patients pharmacy requesting a refill of lamoTRIgine  (LAMICTAL ) 150 MG tablet   Last visit 08-10-24 Next visit 08-17-24     Preferred [facility] [address] [place]), [place] - [address] Phone: [phone]  Fax: [phone]

## 2024-08-12 ENCOUNTER — Other Ambulatory Visit: Payer: Self-pay | Admitting: Psychiatry

## 2024-08-12 DIAGNOSIS — F314 Bipolar disorder, current episode depressed, severe, without psychotic features: Secondary | ICD-10-CM

## 2024-08-12 MED ORDER — LAMOTRIGINE 150 MG PO TABS: 150.0000 mg | ORAL_TABLET | Freq: Every day | ORAL | 2 refills | Status: DC

## 2024-08-12 NOTE — Telephone Encounter (Signed)
 Refilled, thank you.

## 2024-08-17 ENCOUNTER — Encounter: Payer: Self-pay | Admitting: Psychiatry

## 2024-08-17 ENCOUNTER — Ambulatory Visit (INDEPENDENT_AMBULATORY_CARE_PROVIDER_SITE_OTHER): Admitting: Psychiatry

## 2024-08-17 ENCOUNTER — Other Ambulatory Visit: Payer: Self-pay

## 2024-08-17 VITALS — BP 127/77 | HR 64 | Temp 97.7°F | Ht 64.25 in | Wt 169.8 lb

## 2024-08-17 DIAGNOSIS — F411 Generalized anxiety disorder: Secondary | ICD-10-CM | POA: Diagnosis not present

## 2024-08-17 DIAGNOSIS — F314 Bipolar disorder, current episode depressed, severe, without psychotic features: Secondary | ICD-10-CM | POA: Diagnosis not present

## 2024-08-17 DIAGNOSIS — F422 Mixed obsessional thoughts and acts: Secondary | ICD-10-CM | POA: Diagnosis not present

## 2024-08-17 NOTE — Progress Notes (Signed)
 BH MD/PA/NP OP Progress Note  08/17/2024 9:04 AM Wendy Holmes  MRN:  [id number]  Chief Complaint:  Chief Complaint  Patient presents with   Follow-up   HPI: 38 year old female presenting [facility] for follow-up.  Patient reports that she had a better week but states that she still cannot seem to get around to getting stuff done around her house.  Patient recognizes that she is feeling a little bit more focused that she has started on clonidine  and states that she has been sleeping very well over the last several nights.  Patient was started on clonidine  XR 0.1 mg nightly in which she reported improvement.  Patient is observed without the akathisia like symptoms or hyperactivity that she usually is observed with.  Patient reports that last week she has noticed that since it was brought up that this may be hormonal in regards of the uneasy and sudden feelings open worsening of symptoms once a month and reports that she agrees with the observation by this clinician.  Patient has been states she will keep an eye on it as well as this provider will keep an eye on worsening symptoms as the month progresses.  Patient reports currently she is feels comfortable and states that she wants to stay with the current medications and wants to see if there is improvement over the next week as she is getting used to not feeling as jumpy or hyperactive as usual.  Patient reports satisfaction with her current medication regimen and reports that she wants to work on trying to go out more and spend time with more family.  Patient reports she went out on tubing trip and stated she started feeling her agoraphobia like symptoms in which she feel like she can get away and she would rather go home.  Patient reports that she will continue working on and patient was encouraged to try taking small tasks and placed herself in situations where she feels she can go home adding time and not feel like she is stuck for extend amount of times  away from home.  Patient verbalized understanding and agreement and states that she has been feeling better about going out and is happy.  Patient with no other questions or concerns at this time.  Patient is in agreement with treatment plan.  Patient denies SI, HI, AVH.  Patient to follow up in 1 week. Visit Diagnosis:    ICD-10-CM   1. Bipolar disorder with severe depression (HCC)  F31.4     2. Generalized anxiety disorder  F41.1     3. Mixed obsessional thoughts and acts  F42.2       Past Psychiatric History:  Previous Psych Hospitalizations: Denies   Outpatient treatment: Seen a psychiatrist in 2017, unable to recall the name, currently being manages with primary care with [facility].   Medications Current: -Lamictal  150mg  once daily, for bipolar depression -Lybalvi  5mg /10mg  once daily, for hallucinations.  -Hydroxyzine  25mg  TID as needed for anxiety.  -Clonidine  ER 0.1mg , for ADHD   Medication Trials: - Latuda 40mg , side effects, stopped taking after 8 months, nausea vomiting - Seroquel - 2013, took 2 doses after waking up after taking medications and becoming violent. - Klonopin - 2018, took too many doses in one day, and then encountered stressors and attempted to take a overdose to commit suicide, in which she did not go to the ER, but she reports she took a handful.  -Gabapentin  300mg  BID, 07/20/24, poor response - Cogentin  0,5mg  BID, 07/20/24,  oversedating -Zyprexa  7.5mg , weight gain 08/04/24   Suicide & Violence: - Previous suicide attempt in 2018, did not go to the hospital.   Psychotherapy: Not currently participating in therapy.     Legal: Denies    Past Medical History:  Past Medical History:  Diagnosis Date   Anxiety    Asthma    Bipolar 1 disorder (HCC)    CIN III (cervical intraepithelial neoplasia III)    Depression    History of LEEP (loop electrosurgical excision procedure) of cervix complicating pregnancy, second trimester    2012   Vaginal Pap  smear, abnormal     Past Surgical History:  Procedure Laterality Date   CHOLECYSTECTOMY     TONSILLECTOMY     TYMPANOSTOMY TUBE PLACEMENT     childhood   URETERAL STENT PLACEMENT      Family Psychiatric History: No Additional  Family History: History reviewed. No pertinent family history.  Social History:  Social History   Socioeconomic History   Marital status: Divorced    Spouse name: Not on file   Number of children: 2   Years of education: 12   Highest education level: Some college, no degree  Occupational History   Not on file  Tobacco Use   Smoking status: Former    Current packs/day: 0.00    Types: Cigarettes    Quit date: 06/10/2020    Years since quitting: 4.1   Smokeless tobacco: Never  Vaping Use   Vaping status: Never Used  Substance and Sexual Activity   Alcohol use: Not Currently    Alcohol/week: 3.0 standard drinks of alcohol    Types: 3 Standard drinks or equivalent per week   Drug use: Not Currently    Types: Marijuana, Cocaine   Sexual activity: Not Currently    Partners: Male    Birth control/protection: Implant  Other Topics Concern   Not on file  Social History Narrative   Not on file   Social Drivers of Health   Financial Resource Strain: Not on file  Food Insecurity: Not on file  Transportation Needs: Not on file  Physical Activity: Not on file  Stress: Not on file  Social Connections: Not on file    Allergies: No Known Allergies  Metabolic Disorder Labs: No results found for: HGBA1C, MPG No results found for: PROLACTIN No results found for: CHOL, TRIG, HDL, CHOLHDL, VLDL, LDLCALC Lab Results  Component Value Date   TSH 2.16 08/13/2012    Therapeutic Level Labs: No results found for: LITHIUM No results found for: VALPROATE No results found for: CBMZ  Current Medications: Current Outpatient Medications  Medication Sig Dispense Refill   albuterol  (PROVENTIL ) (2.5 MG/3ML) 0.083% nebulizer solution  Take 3 mLs (2.5 mg total) by nebulization every 6 (six) hours as needed for wheezing or shortness of breath. 75 mL 12   cloNIDine  HCl (KAPVAY ) 0.1 MG TB12 ER tablet Take 1 tablet (0.1 mg total) by mouth at bedtime. 30 tablet 0   etonogestrel  (NEXPLANON ) 68 MG IMPL implant 1 each by Subdermal route once.     hydrOXYzine  (ATARAX ) 25 MG tablet Take 1 tablet (25 mg total) by mouth 3 (three) times daily. 30 tablet 2   lamoTRIgine  (LAMICTAL ) 150 MG tablet Take 1 tablet (150 mg total) by mouth daily. 30 tablet 2   loperamide  (IMODIUM ) 2 MG capsule Take 1 capsule (2 mg total) by mouth 4 (four) times daily as needed for diarrhea or loose stools. 12 capsule 0   methylPREDNISolone  (MEDROL  DOSEPAK)  4 MG TBPK tablet Take 6 pills on day one then decrease by 1 pill each day 21 tablet 0   OLANZapine -Samidorphan (LYBALVI ) 5-10 MG TABS Take 1 tablet by mouth daily. 30 tablet 0   ondansetron  (ZOFRAN -ODT) 4 MG disintegrating tablet Take 1 tablet (4 mg total) by mouth every 8 (eight) hours as needed for nausea or vomiting. 20 tablet 0   No current facility-administered medications for this visit.     Musculoskeletal: Strength & Muscle Tone: within normal limits Gait & Station: normal Patient leans: N/A  Psychiatric Specialty Exam: Review of Systems  Constitutional: Negative.   HENT: Negative.    Eyes: Negative.   Respiratory: Negative.    Cardiovascular: Negative.   Gastrointestinal: Negative.   Endocrine: Negative.   Genitourinary: Negative.   Musculoskeletal: Negative.   Skin: Negative.   Allergic/Immunologic: Negative.   Neurological: Negative.   Hematological: Negative.   Psychiatric/Behavioral:  The patient is nervous/anxious.     Blood pressure 127/77, pulse 64, temperature 97.7 F (36.5 C), temperature source Temporal, height 5' 4.25 (1.632 m), weight 169 lb 12.8 oz (77 kg).Body mass index is 28.92 kg/m.  General Appearance: Well Groomed  Eye Contact:  Good  Speech:  Clear and Coherent   Volume:  Normal  Mood:  Anxious and Depressed  Affect:  Appropriate  Thought Process:  Coherent  Orientation:  Full (Time, Place, and Person)  Thought Content: Logical   Suicidal Thoughts:  No  Homicidal Thoughts:  No  Memory:  Immediate;   Good Recent;   Good Remote;   Good  Judgement:  Good  Insight:  Good  Psychomotor Activity:  Normal  Concentration:  Concentration: Good and Attention Span: Good  Recall:  Good  Fund of Knowledge: Good  Language: Good  Akathisia:  No  Handed:  Right  AIMS (if indicated): not done  Assets:  Desire for Improvement Financial Resources/Insurance Housing  ADL's:  Intact  Cognition: WNL  Sleep:  Good   Screenings: GAD-7    Flowsheet Row Office Visit from 07/07/2024 in [facility] Office Visit from 06/09/2024 in [facility] Visit from 05/25/2024 in [facility] Office Visit from 05/11/2024 in [facility] Office Visit from 04/29/2024 in [facility]  Total GAD-7 Score 15 8 15 8 21    PHQ2-9    Flowsheet Row Office Visit from 07/07/2024 in [facility] Office Visit from 06/09/2024 in [facility] Office Visit from 05/25/2024 in [facility] Office Visit from 05[facility]11/2024 in [facility] Office Visit from 04/29/2024 in [facility]  PHQ-2 Total Score 4 2 2 3 6   PHQ-9 Total Score 16 7 13 11 26    Flowsheet Row Office Visit from 07/07/2024 in [facility] Office Visit from 06/09/2024 in [facility] Office Visit from 05/25/2024 in [facility]  C-SSRS  RISK CATEGORY No Risk No Risk No Risk     Assessment and Plan:  Assessment - Diagnosis: Bipolar disorder with severe depression (HCC) [F31.4]  2. Generalized anxiety disorder [F41.1]  3. Mixed obsessional thoughts and acts [F42.2]    Differential Diagnosis: Borderline Personality Disorder, recurrent abandonment   - Progress: Patient reporting that she her symptoms are greatly improving stating no auditory hallucinations as well  as no sleeping issues stating that she is sleeping from 10 to 7 AM every night.  Patient is reporting improvement in her daily life but states that she has continued to have anxiety. - Risk Factors: Suicidal risk, worsening symptoms  Plan - Medications:  Continue Lamictal  to 150 mg by mouth once daily.  Patient was educated on the importance of monitoring for rashes and to stop taking medication should a rash develop and to contact the clinic.  Patient also reported nausea and vomiting as well as headache and sedation advised to take at night as well as to wait for medication on the symptoms, please notify clinic if symptoms last longer than 1 week. Continue Lybalvi  5mg /10mg  for hallucinations, and due to weight concerns as she has gained 30lb since starting Zyprexa .   Pt has been educated of Tardive Dyskinesia and abnormal movements, pt to call the provider if she experience symptoms and to stop the medication.  Start Clonidine  ER 0.1mg  once daily before bed, for ADHD like symptoms utilizing non-stimulant due to Bipolar diagnosis, pt has been educated of the risk of hypotension and instructed to chjeck BP first if she feels dizzy, sick, or lethargic.  4.  Continue Taking Hydroxyzine  TID as needed for anxiety.   - Psychotherapy: Patient placed on a wait list here at [facility] and is waiting for in person sessions. - Education: Patient educated on importance of monitoring for rashes with Lamictal .  Patient educated on suicide risk in which she agrees that she will call 911  or go to emergency department should she have suicidal thoughts with or without a plan.  To educate on behaviors and to continue journaling and monitoring of her behaviors regarding texting which she states has been pushing people away.  Advised to follow the recommendation and treatment plan of the therapists while working around these OCD tendencies and behaviors. - Follow-Up: Patient will follow up in 2 weeks - Referrals: [facility] for ADHD testing - Safety Planning: Patient was educated on her depression and to notify the clinic should she have worsening symptoms.  Patient also notified that should she have suicidal ideation with or without a plan to call 911 or go to the closest emergency department.  Patient denies having a firearm within the home.  Patient/Guardian was advised Release of Information must be obtained prior to any record release in order to collaborate their care with an outside provider. Patient/Guardian was advised if they have not already done so to contact the registration department to sign all necessary forms in order for us  to release information regarding their care.   Consent: Patient/Guardian gives verbal consent for treatment and assignment of benefits for services provided during this visit. Patient/Guardian expressed understanding and agreed to proceed.    Dorn Jama Der, NP 08/17/2024, 9:04 AM

## 2024-08-24 ENCOUNTER — Other Ambulatory Visit: Payer: Self-pay

## 2024-08-24 ENCOUNTER — Encounter: Payer: Self-pay | Admitting: Psychiatry

## 2024-08-24 ENCOUNTER — Ambulatory Visit (INDEPENDENT_AMBULATORY_CARE_PROVIDER_SITE_OTHER): Admitting: Psychiatry

## 2024-08-24 VITALS — BP 164/101 | HR 73 | Temp 97.6°F | Ht 64.25 in | Wt 173.4 lb

## 2024-08-24 DIAGNOSIS — F314 Bipolar disorder, current episode depressed, severe, without psychotic features: Secondary | ICD-10-CM

## 2024-08-24 DIAGNOSIS — F422 Mixed obsessional thoughts and acts: Secondary | ICD-10-CM

## 2024-08-24 DIAGNOSIS — F411 Generalized anxiety disorder: Secondary | ICD-10-CM | POA: Diagnosis not present

## 2024-08-24 NOTE — Progress Notes (Signed)
 BH MD/PA/NP OP Progress Note  08/24/2024 11:05 AM Wendy Holmes  MRN:  [id number]  Chief Complaint:  Chief Complaint  Patient presents with   Follow-up   HPI: 38 year old female presenting [facility] for follow-up.  Patient reports that she has had a very rough week stating that she has had an accident on accident happened to her in which she had anxiety attacks up to 4 times this week.  Patient reports the first accident revolves of duckling in which she tried to take care of a duckling that was orphan by the duckling's mother and she took it in the house and when she took the ducking the house she actually rolled over the duckling and suffocated it in which caused a panic attack.  Patient also reports that she found her daughter with a tattoo in the daughter's biological father's handwriting on her arm and which induced another panic attack.  Patient reports that she called her mother whenever the panic attack came on in which she was able to redirect herself through her mother guiding her.  Patient reports that the medications are appropriate and she is going to try to have a better week this week but states that she knows that she managed herself much better and did not stay in anxiety and was able to move out of a quite easily.  Patient's insight and judgment is rated high for this observation.  Patient with no other questions or concerns at this time.  Patient with recommendation to keep current medication schedule.  See plan.  No questions or concerns at this time.  Patient to follow up in 1 week.  Visit Diagnosis:    ICD-10-CM   1. Bipolar disorder with severe depression (HCC)  F31.4     2. Generalized anxiety disorder  F41.1     3. Mixed obsessional thoughts and acts  F42.2       Past Psychiatric History:  Previous Psych Hospitalizations: Denies   Outpatient treatment: Seen a psychiatrist in 2017, unable to recall the name, currently being manages with primary care with [facility].    Medications Current: -Lamictal  150mg  once daily, for bipolar depression -Lybalvi  5mg /10mg  once daily, for hallucinations.  -Hydroxyzine  25mg  TID as needed for anxiety.  -Clonidine  ER 0.1mg , for ADHD   Medication Trials: - Latuda 40mg , side effects, stopped taking after 8 months, nausea vomiting - Seroquel - 2013, took 2 doses after waking up after taking medications and becoming violent. - Klonopin - 2018, took too many doses in one day, and then encountered stressors and attempted to take a overdose to commit suicide, in which she did not go to the ER, but she reports she took a handful.  -Gabapentin  300mg  BID, 07/20/24, poor response - Cogentin  0,5mg  BID, 07/20/24, oversedating -Zyprexa  7.5mg , weight gain 08/04/24   Suicide & Violence: - Previous suicide attempt in 2018, did not go to the hospital.   Psychotherapy: Not currently participating in therapy.     Legal: Denies      Past Medical History:  Past Medical History:  Diagnosis Date   Anxiety    Asthma    Bipolar 1 disorder (HCC)    CIN III (cervical intraepithelial neoplasia III)    Depression    History of LEEP (loop electrosurgical excision procedure) of cervix complicating pregnancy, second trimester    2012   Vaginal Pap smear, abnormal     Past Surgical History:  Procedure Laterality Date   CHOLECYSTECTOMY     TONSILLECTOMY  TYMPANOSTOMY TUBE PLACEMENT     childhood   URETERAL STENT PLACEMENT      Family Psychiatric History: No additional  Family History: History reviewed. No pertinent family history.  Social History:  Social History   Socioeconomic History   Marital status: Divorced    Spouse name: Not on file   Number of children: 2   Years of education: 12   Highest education level: Some college, no degree  Occupational History   Not on file  Tobacco Use   Smoking status: Former    Current packs/day: 0.00    Types: Cigarettes    Quit date: 06/10/2020    Years since quitting: 4.2    Smokeless tobacco: Never  Vaping Use   Vaping status: Never Used  Substance and Sexual Activity   Alcohol use: Not Currently    Alcohol/week: 3.0 standard drinks of alcohol    Types: 3 Standard drinks or equivalent per week   Drug use: Not Currently    Types: Marijuana, Cocaine   Sexual activity: Not Currently    Partners: Male    Birth control/protection: Implant  Other Topics Concern   Not on file  Social History Narrative   Not on file   Social Drivers of Health   Financial Resource Strain: Not on file  Food Insecurity: Not on file  Transportation Needs: Not on file  Physical Activity: Not on file  Stress: Not on file  Social Connections: Not on file    Allergies: No Known Allergies  Metabolic Disorder Labs: No results found for: HGBA1C, MPG No results found for: PROLACTIN No results found for: CHOL, TRIG, HDL, CHOLHDL, VLDL, LDLCALC Lab Results  Component Value Date   TSH 2.16 08/13/2012    Therapeutic Level Labs: No results found for: LITHIUM No results found for: VALPROATE No results found for: CBMZ  Current Medications: Current Outpatient Medications  Medication Sig Dispense Refill   albuterol  (PROVENTIL ) (2.5 MG/3ML) 0.083% nebulizer solution Take 3 mLs (2.5 mg total) by nebulization every 6 (six) hours as needed for wheezing or shortness of breath. 75 mL 12   cloNIDine  HCl (KAPVAY ) 0.1 MG TB12 ER tablet Take 1 tablet (0.1 mg total) by mouth at bedtime. 30 tablet 0   etonogestrel  (NEXPLANON ) 68 MG IMPL implant 1 each by Subdermal route once.     hydrOXYzine  (ATARAX ) 25 MG tablet Take 1 tablet (25 mg total) by mouth 3 (three) times daily. 30 tablet 2   lamoTRIgine  (LAMICTAL ) 150 MG tablet Take 1 tablet (150 mg total) by mouth daily. 30 tablet 2   loperamide  (IMODIUM ) 2 MG capsule Take 1 capsule (2 mg total) by mouth 4 (four) times daily as needed for diarrhea or loose stools. 12 capsule 0   methylPREDNISolone  (MEDROL  DOSEPAK) 4 MG  TBPK tablet Take 6 pills on day one then decrease by 1 pill each day 21 tablet 0   OLANZapine -Samidorphan (LYBALVI ) 5-10 MG TABS Take 1 tablet by mouth daily. 30 tablet 0   ondansetron  (ZOFRAN -ODT) 4 MG disintegrating tablet Take 1 tablet (4 mg total) by mouth every 8 (eight) hours as needed for nausea or vomiting. 20 tablet 0   No current facility-administered medications for this visit.     Musculoskeletal: Strength & Muscle Tone: within normal limits Gait & Station: normal Patient leans: N/A  Psychiatric Specialty Exam: Review of Systems  Constitutional: Negative.   HENT: Negative.    Eyes: Negative.   Respiratory: Negative.    Cardiovascular: Negative.   Gastrointestinal: Negative.  Endocrine: Negative.   Genitourinary: Negative.   Musculoskeletal: Negative.   Skin: Negative.   Allergic/Immunologic: Negative.   Neurological: Negative.   Hematological: Negative.   Psychiatric/Behavioral:  Positive for dysphoric mood. The patient is nervous/anxious.     Blood pressure (!) 164/101, pulse 73, temperature 97.6 F (36.4 C), temperature source Temporal, height 5' 4.25 (1.632 m), weight 173 lb 6.4 oz (78.7 kg).Body mass index is 29.53 kg/m.  General Appearance: Well Groomed  Eye Contact:  Good  Speech:  Clear and Coherent  Volume:  Normal  Mood:  Anxious and Depressed  Affect:  Appropriate  Thought Process:  Coherent  Orientation:  Full (Time, Place, and Person)  Thought Content: Logical   Suicidal Thoughts:  No  Homicidal Thoughts:  No  Memory:  Immediate;   Good Recent;   Good Remote;   Good  Judgement:  Good  Insight:  Good  Psychomotor Activity:  Normal  Concentration:  Concentration: Good and Attention Span: Good  Recall:  Good  Fund of Knowledge: Good  Language: Good  Akathisia:  No  Handed:  Right  AIMS (if indicated):   Assets:  Desire for Improvement Financial Resources/Insurance Housing  ADL's:  Intact  Cognition: WNL  Sleep:  Good    Screenings: GAD-7    Flowsheet Row Office Visit from 07/07/2024 in [facility] Visit from 06/09/2024 in [facility] Office Visit from 05/25/2024 in [facility] Office Visit from 05/11/2024 in [facility] Office Visit from 04/29/2024 in [facility]  Total GAD-7 Score 15 8 15 8 21    PHQ2-9    Flowsheet Row Office Visit from 07/07/2024 in [facility] Office Visit from 06/09/2024 in [facility] Office Visit from 05/25/2024 in [facility] Office Visit from 05/11/2024 in [facility] Office Visit from 04/29/2024 in [facility]  PHQ-2 Total Score 4 2 2 3 6   PHQ-9 Total Score 16 7 13 11 26    Flowsheet Row Office Visit from 07/07/2024 in [facility] Office Visit from 06/09/2024 in [facility] Office Visit from 05/25/2024 in [facility]  C-SSRS RISK CATEGORY No Risk No Risk No Risk     Assessment and Plan:  Assessment - Diagnosis: Bipolar disorder with severe depression (HCC) [F31.4]  2. Generalized anxiety disorder [F41.1]  3. Mixed obsessional thoughts and acts [F42.2]    Differential Diagnosis: Borderline Personality Disorder, recurrent abandonment   - Progress: Patient reporting that she her symptoms are greatly improving stating no auditory hallucinations as well as no sleeping issues stating that she is sleeping from 10 to 7 AM every night.  Patient is reporting improvement in her daily life but states that she has continued to have anxiety. - Risk Factors: Suicidal risk,  worsening symptoms  Plan - Medications:  Continue Lamictal  to 150 mg by mouth once daily.  Patient was educated on the importance of monitoring for rashes and to stop taking medication should a rash develop and to contact the clinic.  Patient also reported nausea and vomiting as well as headache and sedation advised to take at night as well as to wait for medication on the symptoms, please notify clinic if symptoms last longer than 1 week. Continue Lybalvi  5mg /10mg  for hallucinations,  and due to weight concerns as she has gained 30lb since starting Zyprexa .   Pt has been educated of Tardive Dyskinesia and abnormal movements, pt to call the provider if she experience symptoms and to stop the medication.  Start Clonidine  ER 0.1mg  once daily before bed, for ADHD like symptoms utilizing non-stimulant due to Bipolar diagnosis, pt has been educated of the risk of hypotension and instructed to chjeck BP first if she feels dizzy, sick, or lethargic.  4.  Continue Taking Hydroxyzine  TID as needed for anxiety.   - Psychotherapy: Patient placed on a wait list here at [facility] and is waiting for in person sessions. - Education: Patient educated on importance of monitoring for rashes with Lamictal .  Patient educated on suicide risk in which she agrees that she will call 911 or go to emergency department should she have suicidal thoughts with or without a plan.  To educate on behaviors and to continue journaling and monitoring of her behaviors regarding texting which she states has been pushing people away.  Advised to follow the recommendation and treatment plan of the therapists while working around these OCD tendencies and behaviors. - Follow-Up: Patient will follow up in 2 weeks - Referrals: [facility] for ADHD testing - Safety Planning: Patient was educated on her depression and to notify the clinic should she have worsening symptoms.  Patient also notified that should she have suicidal ideation with or without a  plan to call 911 or go to the closest emergency department.  Patient denies having a firearm within the home.  Patient/Guardian was advised Release of Information must be obtained prior to any record release in order to collaborate their care with an outside provider. Patient/Guardian was advised if they have not already done so to contact the registration department to sign all necessary forms in order for us  to release information regarding their care.   Consent: Patient/Guardian gives verbal consent for treatment and assignment of benefits for services provided during this visit. Patient/Guardian expressed understanding and agreed to proceed.    Dorn Jama Der, NP 08/24/2024, 11:05 AM

## 2024-08-27 ENCOUNTER — Other Ambulatory Visit: Payer: Self-pay | Admitting: Psychiatry

## 2024-08-27 ENCOUNTER — Telehealth: Payer: Self-pay

## 2024-08-27 DIAGNOSIS — F314 Bipolar disorder, current episode depressed, severe, without psychotic features: Secondary | ICD-10-CM

## 2024-08-27 MED ORDER — LYBALVI 5-10 MG PO TABS: 1.0000 | ORAL_TABLET | Freq: Every day | ORAL | 2 refills | Status: DC

## 2024-08-27 NOTE — Telephone Encounter (Signed)
 Received fax from patients pharmacy requesting a refill of OLANZapine -Samidorphan (LYBALVI ) 5-10 MG TABS this came from [facility] but I see in her chart that you sent to [facility] the last time patient did state that it was a convenience to have it delivered to her   Last visit 08-24-24 Next visit 08-31-24

## 2024-08-31 ENCOUNTER — Ambulatory Visit (INDEPENDENT_AMBULATORY_CARE_PROVIDER_SITE_OTHER): Admitting: Psychiatry

## 2024-08-31 ENCOUNTER — Encounter: Payer: Self-pay | Admitting: Psychiatry

## 2024-08-31 VITALS — BP 118/76 | HR 84 | Temp 98.5°F | Ht 64.25 in | Wt 170.8 lb

## 2024-08-31 DIAGNOSIS — F314 Bipolar disorder, current episode depressed, severe, without psychotic features: Secondary | ICD-10-CM

## 2024-08-31 DIAGNOSIS — F411 Generalized anxiety disorder: Secondary | ICD-10-CM | POA: Diagnosis not present

## 2024-08-31 DIAGNOSIS — F422 Mixed obsessional thoughts and acts: Secondary | ICD-10-CM | POA: Diagnosis not present

## 2024-08-31 MED ORDER — LYBALVI 10-10 MG PO TABS: 1.0000 | ORAL_TABLET | Freq: Every day | ORAL | 2 refills | Status: DC

## 2024-08-31 MED ORDER — CLONIDINE HCL ER 0.1 MG PO TB12: 0.2000 mg | ORAL_TABLET | Freq: Every day | ORAL | 0 refills | Status: DC

## 2024-08-31 NOTE — Progress Notes (Signed)
 [facility] MD/PA/NP OP Progress Note  08/31/2024 9:50 AM Wendy Holmes  MRN:  [id number]  Chief Complaint:  Chief Complaint  Patient presents with   Follow-up   HPI: 38 year old female presenting [facility] for follow-up.  Patient reports that she is doing well stating that she had a good week this week but states that she had to go to [facility] due to having high blood pressure.  Patient was placed on amlodipine as well as Flexeril .  Patient was ask about Flexeril  in which the prescriptions noted that the patient should take this medication 5 mg 3 times a day as needed for muscle spasms.  Patient reports that this was too much patient was advised to only take once daily due to the significant sedation of the medication.  Patient also reports that she is having sleep issues stating that it has worsened over the last 2 weeks in which this was recognized when changing the medication a little Baldy as we have opted to go to a lower dose of Zyprexa  5 mg to the combination of drug.  Patient has now been increased to Lybalvi  10mg /10mg .  Patient was also increased to 0.2 mg of clonidine  due to patient stating she is finding effectiveness during the day but states that she could find improvement.  Patient was advised send at the minute clonidine  can drop the blood pressure as she is taking blood pressure medicines to pay close attention to blood pressure and to take blood pressure if she has issues with weakness or dizziness.  Patient verbalized understanding.  Patient with no other questions or concerns.  Patient is to continue medication regimen otherwise stated.  See plan.  Patient in agreement with treatment plan.  Patient denies SI, HI, AVH.  Patient to follow-up in 2 weeks.  Visit Diagnosis:    ICD-10-CM   1. Bipolar disorder with severe depression (HCC)  F31.4     2. Generalized anxiety disorder  F41.1     3. Mixed obsessional thoughts and acts  F42.2       Past Psychiatric History:  Previous  Psych Hospitalizations: Denies   Outpatient treatment: Seen a psychiatrist in 2017, unable to recall the name, currently being manages with primary care with [facility].   Medications Current: -Lamictal  150mg  once daily, for bipolar depression -Lybalvi  10mg /10mg  once daily, for hallucinations.  -Hydroxyzine  25mg  TID as needed for anxiety.  -Clonidine  ER 0.1mg , for ADHD   Medication Trials: - Latuda 40mg , side effects, stopped taking after 8 months, nausea vomiting - Seroquel - 2013, took 2 doses after waking up after taking medications and becoming violent. - Klonopin - 2018, took too many doses in one day, and then encountered stressors and attempted to take a overdose to commit suicide, in which she did not go to the ER, but she reports she took a handful.  -Gabapentin  300mg  BID, 07/20/24, poor response - Cogentin  0,5mg  BID, 07/20/24, oversedating -Zyprexa  7.5mg , weight gain 08/04/24   Suicide & Violence: - Previous suicide attempt in 2018, did not go to the hospital.   Psychotherapy: Not currently participating in therapy.     Legal: Denies  Past Medical History:  Past Medical History:  Diagnosis Date   Anxiety    Asthma    Bipolar 1 disorder (HCC)    CIN III (cervical intraepithelial neoplasia III)    Depression    History of LEEP (loop electrosurgical excision procedure) of cervix complicating pregnancy, second trimester    2012   Vaginal Pap  smear, abnormal     Past Surgical History:  Procedure Laterality Date   CHOLECYSTECTOMY     TONSILLECTOMY     TYMPANOSTOMY TUBE PLACEMENT     childhood   URETERAL STENT PLACEMENT      Family Psychiatric History: No Additional  Family History: History reviewed. No pertinent family history.  Social History:  Social History   Socioeconomic History   Marital status: Divorced    Spouse name: Not on file   Number of children: 2   Years of education: 12   Highest education level: Some college, no degree  Occupational  History   Not on file  Tobacco Use   Smoking status: Former    Current packs/day: 0.00    Types: Cigarettes    Quit date: 06/10/2020    Years since quitting: 4.2   Smokeless tobacco: Never  Vaping Use   Vaping status: Never Used  Substance and Sexual Activity   Alcohol use: Not Currently    Alcohol/week: 3.0 standard drinks of alcohol    Types: 3 Standard drinks or equivalent per week   Drug use: Not Currently    Types: Marijuana, Cocaine   Sexual activity: Not Currently    Partners: Male    Birth control/protection: Implant  Other Topics Concern   Not on file  Social History Narrative   Not on file   Social Drivers of Health   Financial Resource Strain: Not on file  Food Insecurity: Not on file  Transportation Needs: Not on file  Physical Activity: Not on file  Stress: Not on file  Social Connections: Not on file    Allergies: No Known Allergies  Metabolic Disorder Labs: No results found for: HGBA1C, MPG No results found for: PROLACTIN No results found for: CHOL, TRIG, HDL, CHOLHDL, VLDL, LDLCALC Lab Results  Component Value Date   TSH 2.16 08/13/2012    Therapeutic Level Labs: No results found for: LITHIUM No results found for: VALPROATE No results found for: CBMZ  Current Medications: Current Outpatient Medications  Medication Sig Dispense Refill   albuterol  (PROVENTIL ) (2.5 MG/3ML) 0.083% nebulizer solution Take 3 mLs (2.5 mg total) by nebulization every 6 (six) hours as needed for wheezing or shortness of breath. 75 mL 12   amLODipine (NORVASC) 5 MG tablet Take 5 mg by mouth daily.     cloNIDine  HCl (KAPVAY ) 0.1 MG TB12 ER tablet Take 1 tablet (0.1 mg total) by mouth at bedtime. 30 tablet 0   cyclobenzaprine  (FLEXERIL ) 5 MG tablet Take 5 mg by mouth 3 (three) times daily as needed.     etonogestrel  (NEXPLANON ) 68 MG IMPL implant 1 each by Subdermal route once.     hydrOXYzine  (ATARAX ) 25 MG tablet Take 1 tablet (25 mg total) by  mouth 3 (three) times daily. 30 tablet 2   lamoTRIgine  (LAMICTAL ) 150 MG tablet Take 1 tablet (150 mg total) by mouth daily. 30 tablet 2   loperamide  (IMODIUM ) 2 MG capsule Take 1 capsule (2 mg total) by mouth 4 (four) times daily as needed for diarrhea or loose stools. 12 capsule 0   methylPREDNISolone  (MEDROL  DOSEPAK) 4 MG TBPK tablet Take 6 pills on day one then decrease by 1 pill each day 21 tablet 0   OLANZapine -Samidorphan (LYBALVI ) 5-10 MG TABS Take 1 tablet by mouth daily. 30 tablet 2   ondansetron  (ZOFRAN -ODT) 4 MG disintegrating tablet Take 1 tablet (4 mg total) by mouth every 8 (eight) hours as needed for nausea or vomiting. 20 tablet 0  No current facility-administered medications for this visit.     Musculoskeletal: Strength & Muscle Tone: within normal limits Gait & Station: normal Patient leans: N/A   Psychiatric Specialty Exam: Review of Systems  Constitutional: Negative.   HENT: Negative.    Eyes: Negative.   Respiratory: Negative.    Cardiovascular: Negative.   Gastrointestinal: Negative.   Endocrine: Negative.   Genitourinary: Negative.   Musculoskeletal: Negative.   Skin: Negative.   Allergic/Immunologic: Negative.   Neurological: Negative.   Hematological: Negative.   Psychiatric/Behavioral:  Positive for dysphoric mood. The patient is nervous/anxious.     Blood pressure 118/76, pulse 84, temperature 98.5 F (36.9 C), temperature source Temporal, height 5' 4.25 (1.632 m), weight 170 lb 12.8 oz (77.5 kg), SpO2 98%.Body mass index is 29.09 kg/m.  General Appearance: Well Groomed  Eye Contact:  Good  Speech:  Clear and Coherent  Volume:  Normal  Mood:  Anxious and Depressed  Affect:  Appropriate  Thought Process:  Coherent  Orientation:  Full (Time, Place, and Person)  Thought Content: Logical   Suicidal Thoughts:  No  Homicidal Thoughts:  No  Memory:  Immediate;   Good Recent;   Good Remote;   Good  Judgement:  Good  Insight:  Good  Psychomotor  Activity:  Normal  Concentration:  Concentration: Good and Attention Span: Good  Recall:  Good  Fund of Knowledge: Good  Language: Good  Akathisia:  No  Handed:  Right  AIMS (if indicated): not done  Assets:  Desire for Improvement Financial Resources/Insurance Housing  ADL's:  Intact  Cognition: WNL  Sleep:  Good   Screenings: GAD-7    Flowsheet Row Office Visit from 07/07/2024 in [facility] Office Visit from 06/09/2024 in [facility] Office Visit from 05/25/2024 in [facility] Office Visit from 05/11/2024 in [facility] Office Visit from 04/29/2024 in [facility]  Total GAD-7 Score 15 8 15 8 21    PHQ2-9    Flowsheet Row Office Visit from 07/07/2024 in [facility] Office Visit from 06/09/2024 in [facility] Office Visit from 05/25/2024 in [facility] Office Visit from 05/11/2024 in [facility] Office Visit from 04/29/2024 in [facility]  PHQ-2 Total Score 4 2 2 3 6   PHQ-9 Total Score 16 7 13 11 26    Flowsheet Row Office Visit from 07/07/2024 in [facility] Office Visit from 06/09/2024 in [facility] Office Visit from 05/25/2024 in [facility]  C-SSRS RISK CATEGORY No Risk No Risk No Risk     Assessment and Plan:  Assessment - Diagnosis: Bipolar disorder with severe depression (HCC) [F31.4]  2. Generalized anxiety disorder [F41.1]  3. Mixed obsessional thoughts and acts [F42.2]    Differential Diagnosis: Borderline Personality Disorder, recurrent abandonment   -  Progress: Patient reporting that she her symptoms are greatly improving stating no auditory hallucinations as well as no sleeping issues stating that she is sleeping from 10 to 7 AM every night.  Patient is reporting improvement in her daily life but states that she has continued to have anxiety. - Risk Factors: Suicidal risk, worsening symptoms  Plan - Medications:  Continue Lamictal  to 150 mg by mouth once daily.  Patient was educated on  the importance of monitoring for rashes and to stop taking medication should a rash develop and to contact the clinic.  Patient also reported nausea and vomiting as well as headache and sedation advised to take at night as well as to wait for medication on the symptoms, please notify clinic if symptoms last longer than 1 week. Increase Lybalvi  10mg /10mg  for hallucinations, and due to weight concerns as she has gained 30lb since starting Zyprexa .   Pt has been educated of Tardive Dyskinesia and abnormal movements, pt to call the provider if she experience symptoms and to stop the medication.  Continue Clonidine  ER 0.2mg  once daily before bed, for ADHD like symptoms utilizing non-stimulant due to Bipolar diagnosis, pt has been educated of the risk of hypotension and instructed to chjeck BP first if she feels dizzy, sick, or lethargic.  4.  Continue Taking Hydroxyzine  TID as needed for anxiety.   - Psychotherapy: Patient placed on a wait list here at [facility] and is waiting for in person sessions. - Education: Patient educated on importance of monitoring for rashes with Lamictal .  Patient educated on suicide risk in which she agrees that she will call 911 or go to emergency department should she have suicidal thoughts with or without a plan.  To educate on behaviors and to continue journaling and monitoring of her behaviors regarding texting which she states has been pushing people away.  Advised to follow the recommendation and treatment plan of the therapists while working  around these OCD tendencies and behaviors. - Follow-Up: Patient will follow up in 2 weeks - Referrals: [facility] for ADHD testing - Safety Planning: Patient was educated on her depression and to notify the clinic should she have worsening symptoms.  Patient also notified that should she have suicidal ideation with or without a plan to call 911 or go to the closest emergency department.  Patient denies having a firearm within the home.  Patient/Guardian was advised Release of Information must be obtained prior to any record release in order to collaborate their care with an outside provider. Patient/Guardian was advised if they have not already done so to contact the registration department to sign all necessary forms in order for us  to release information regarding their care.   Consent: Patient/Guardian gives verbal consent for treatment and assignment of benefits for services provided during this visit. Patient/Guardian expressed understanding and agreed to proceed.    Dorn Jama Der, NP 08/31/2024, 9:50 AM

## 2024-09-07 ENCOUNTER — Ambulatory Visit

## 2024-09-14 ENCOUNTER — Encounter: Payer: Self-pay | Admitting: Psychiatry

## 2024-09-14 ENCOUNTER — Ambulatory Visit (INDEPENDENT_AMBULATORY_CARE_PROVIDER_SITE_OTHER): Admitting: Psychiatry

## 2024-09-14 ENCOUNTER — Other Ambulatory Visit: Payer: Self-pay

## 2024-09-14 VITALS — BP 116/76 | HR 80 | Temp 97.8°F | Ht 64.25 in | Wt 169.0 lb

## 2024-09-14 DIAGNOSIS — F314 Bipolar disorder, current episode depressed, severe, without psychotic features: Secondary | ICD-10-CM

## 2024-09-14 DIAGNOSIS — F422 Mixed obsessional thoughts and acts: Secondary | ICD-10-CM

## 2024-09-14 DIAGNOSIS — F411 Generalized anxiety disorder: Secondary | ICD-10-CM

## 2024-09-14 NOTE — Progress Notes (Signed)
 [facility] MD/PA/NP OP Progress Note  09/14/2024 8:59 AM Wendy Holmes  MRN:  [id number]  Chief Complaint:  Chief Complaint  Patient presents with   Follow-up   HPI: 38 year old female presenting [facility] for follow-up.  Patient reports that she had a much better week this week stating that she is feeling better and is feeling good as she is not responding to stress like she typically would.  Patient reports she is feeling more organized with her day stating that she has a big task that she usually takes care of in the beginning of the day stating that the clonidine  is helping her think more clearly and getting better sleep at night.  Patient also reporting no significant issues with low Bobbye with the dose increase stating that no hallucinations are recurring at this time.  Patient is appeared in this assessment well dressed and reports that she is very satisfied with her current medication regimen.  With a past possible pattern of once a week having a bad week this provider is monitoring carefully regarding the possible occurrence of PMDD like behaviors.  Patient states that she is feeling much more comfortable and would like to be seen in 2 weeks.  Patient reports that she is good to be focusing on herself and her family and [place]  Based on this assessment interviews are recommended for the patient to continue current medication schedule.  See plans.  Patient with no other questions or concerns.  Patient is in agreement with treatment plan.  Patient denies SI, HI, AVH.  Patient to follow-up in 2 weeks. Visit Diagnosis:    ICD-10-CM   1. Bipolar disorder with severe depression (HCC)  F31.4     2. Generalized anxiety disorder  F41.1     3. Mixed obsessional thoughts and acts  F42.2       Past Psychiatric History:  Previous Psych Hospitalizations: Denies   Outpatient treatment: Seen a psychiatrist in 2017, unable to recall the name, currently being  manages with primary care with [facility].   Medications Current: -Lamictal  150mg  once daily, for bipolar depression -Lybalvi  10mg /10mg  once daily, for hallucinations.  -Hydroxyzine  25mg  TID as needed for anxiety.  -Clonidine  ER 0.1mg , for ADHD   Medication Trials: - Latuda 40mg , side effects, stopped taking after 8 months, nausea vomiting - Seroquel - 2013, took 2 doses after waking up after taking medications and becoming violent. - Klonopin - 2018, took too many doses in one day, and then encountered stressors and attempted to take a overdose to commit suicide, in which she did not go to the ER, but she reports she took a handful.  -Gabapentin  300mg  BID, 07/20/24, poor response - Cogentin  0,5mg  BID, 07/20/24, oversedating -Zyprexa  7.5mg , weight gain 08/04/24   Suicide & Violence: - Previous suicide attempt in 2018, did not go to the hospital.   Psychotherapy: Not currently participating in therapy.     Legal: Denies  Past Medical History:  Past Medical History:  Diagnosis Date   Anxiety    Asthma    Bipolar 1 disorder (HCC)    CIN III (cervical intraepithelial neoplasia III)    Depression    History of LEEP (loop electrosurgical excision procedure) of cervix complicating pregnancy, second trimester    2012   Vaginal Pap smear, abnormal     Past Surgical History:  Procedure Laterality Date   CHOLECYSTECTOMY     TONSILLECTOMY     TYMPANOSTOMY  TUBE PLACEMENT     childhood   URETERAL STENT PLACEMENT      Family Psychiatric History: No additional  Family History: History reviewed. No pertinent family history.  Social History:  Social History   Socioeconomic History   Marital status: Divorced    Spouse name: Not on file   Number of children: 2   Years of education: 12   Highest education level: Some college, no degree  Occupational History   Not on file  Tobacco Use   Smoking status: Former    Current packs/day: 0.00    Types: Cigarettes    Quit date:  06/10/2020    Years since quitting: 4.2   Smokeless tobacco: Never  Vaping Use   Vaping status: Never Used  Substance and Sexual Activity   Alcohol use: Not Currently    Alcohol/week: 3.0 standard drinks of alcohol    Types: 3 Standard drinks or equivalent per week   Drug use: Not Currently    Types: Marijuana, Cocaine   Sexual activity: Not Currently    Partners: Male    Birth control/protection: Implant  Other Topics Concern   Not on file  Social History Narrative   Not on file   Social Drivers of Health   Financial Resource Strain: Not on file  Food Insecurity: Not on file  Transportation Needs: Not on file  Physical Activity: Not on file  Stress: Not on file  Social Connections: Not on file    Allergies: No Known Allergies  Metabolic Disorder Labs: No results found for: HGBA1C, MPG No results found for: PROLACTIN No results found for: CHOL, TRIG, HDL, CHOLHDL, VLDL, LDLCALC Lab Results  Component Value Date   TSH 2.16 08/13/2012    Therapeutic Level Labs: No results found for: LITHIUM No results found for: VALPROATE No results found for: CBMZ  Current Medications: Current Outpatient Medications  Medication Sig Dispense Refill   albuterol  (PROVENTIL ) (2.5 MG/3ML) 0.083% nebulizer solution Take 3 mLs (2.5 mg total) by nebulization every 6 (six) hours as needed for wheezing or shortness of breath. 75 mL 12   amLODipine (NORVASC) 5 MG tablet Take 5 mg by mouth daily.     cloNIDine  HCl (KAPVAY ) 0.1 MG TB12 ER tablet Take 2 tablets (0.2 mg total) by mouth at bedtime. 60 tablet 0   cyclobenzaprine  (FLEXERIL ) 5 MG tablet Take 5 mg by mouth once as needed for muscle spasms.     etonogestrel  (NEXPLANON ) 68 MG IMPL implant 1 each by Subdermal route once.     hydrOXYzine  (ATARAX ) 25 MG tablet Take 1 tablet (25 mg total) by mouth 3 (three) times daily. 30 tablet 2   lamoTRIgine  (LAMICTAL ) 150 MG tablet Take 1 tablet (150 mg total) by mouth daily. 30  tablet 2   loperamide  (IMODIUM ) 2 MG capsule Take 1 capsule (2 mg total) by mouth 4 (four) times daily as needed for diarrhea or loose stools. 12 capsule 0   methylPREDNISolone  (MEDROL  DOSEPAK) 4 MG TBPK tablet Take 6 pills on day one then decrease by 1 pill each day 21 tablet 0   OLANZapine -Samidorphan (LYBALVI ) 10-10 MG TABS Take 1 tablet by mouth at bedtime. 30 tablet 2   ondansetron  (ZOFRAN -ODT) 4 MG disintegrating tablet Take 1 tablet (4 mg total) by mouth every 8 (eight) hours as needed for nausea or vomiting. 20 tablet 0   No current facility-administered medications for this visit.     Musculoskeletal: Strength & Muscle Tone: within normal limits Gait & Station: normal Patient  leans: N/A   Psychiatric Specialty Exam: Review of Systems  Constitutional: Negative.   HENT: Negative.    Eyes: Negative.   Respiratory: Negative.    Cardiovascular: Negative.   Gastrointestinal: Negative.   Endocrine: Negative.   Genitourinary: Negative.   Musculoskeletal: Negative.   Skin: Negative.   Allergic/Immunologic: Negative.   Neurological: Negative.   Hematological: Negative.   Psychiatric/Behavioral:  Positive for dysphoric mood. The patient is nervous/anxious.     Blood pressure 116/76, pulse 80, temperature 97.8 F (36.6 C), temperature source Temporal, height 5' 4.25 (1.632 m), weight 169 lb (76.7 kg).Body mass index is 28.78 kg/m.  General Appearance: Well Groomed  Eye Contact:  Good  Speech:  Clear and Coherent  Volume:  Normal  Mood:  Anxious  Affect:  Appropriate  Thought Process:  Coherent  Orientation:  Full (Time, Place, and Person)  Thought Content: Logical   Suicidal Thoughts:  No  Homicidal Thoughts:  No  Memory:  Immediate;   Good Recent;   Good Remote;   Good  Judgement:  Good  Insight:  Good  Psychomotor Activity:  Normal  Concentration:  Concentration: Good and Attention Span: Good  Recall:  Good  Fund of Knowledge: Good  Language: Good  Akathisia:   No  Handed:  Right  AIMS (if indicated): not done  Assets:  Desire for Improvement Financial Resources/Insurance Housing  ADL's:  Intact  Cognition: WNL  Sleep:  Good   Screenings: GAD-7    Flowsheet Row Office Visit from 07/07/2024 in [facility] Office Visit from 06/09/2024 in [facility] Office Visit from 05/25/2024 in [facility] Office Visit from 05/11/2024 in [facility] Office Visit from 04/29/2024 in [facility]  Total GAD-7 Score 15 8 15 8 21    PHQ2-9    Flowsheet Row Office Visit from 07/07/2024 in [facility] Visit from 06/09/2024 in [facility] Visit from 05[facility]25/2024 in [facility] Visit from 05/11/2024 in [facility] Visit from 04/29/2024 in [facility]  PHQ-2 Total Score 4 2 2 3 6   PHQ-9 Total Score 16 7 13 11 26    Flowsheet Row Office Visit from 07/07/2024 in [facility] Office Visit from 06/09/2024 in [facility] Office Visit from 05/25/2024 in [facility]  C-SSRS RISK CATEGORY No Risk No Risk No Risk     Assessment and Plan:  Assessment - Diagnosis: Bipolar disorder with severe depression (HCC) [F31.4]  2. Generalized anxiety disorder [F41.1]  3. Mixed obsessional thoughts and acts [F42.2]    Differential Diagnosis: Borderline Personality Disorder, recurrent abandonment   - Progress: Patient reporting that she her symptoms are greatly improving stating no auditory hallucinations as well as no sleeping issues stating that she is  sleeping from 10 to 7 AM every night.  Patient is reporting improvement in her daily life but states that she has continued to have anxiety. - Risk Factors: Suicidal risk, worsening symptoms  Plan - Medications:  Continue Lamictal  to 150 mg by mouth once daily.  Patient was educated on the importance of monitoring for rashes and to stop taking medication should a rash develop and to contact the clinic.  Patient also reported nausea and vomiting as well as  headache and sedation advised to take at night as well as to wait for medication on the symptoms, please notify clinic if symptoms last longer than 1 week. Increase Lybalvi  10mg /10mg  for hallucinations, and due to weight concerns as she has gained 30lb since starting Zyprexa .   Pt has been educated of Tardive Dyskinesia and abnormal movements, pt to call the provider if she experience symptoms and to stop the medication.  Continue Clonidine  ER 0.2mg  once daily before bed, for ADHD like symptoms utilizing non-stimulant due to Bipolar diagnosis, pt has been educated of the risk of hypotension and instructed to chjeck BP first if she feels dizzy, sick, or lethargic.  4.  Continue Taking Hydroxyzine  TID as needed for anxiety.   - Psychotherapy: Patient placed on a wait list here at [facility] and is waiting for in person sessions. - Education: Patient educated on importance of monitoring for rashes with Lamictal .  Patient educated on suicide risk in which she agrees that she will call 911 or go to emergency department should she have suicidal thoughts with or without a plan.  To educate on behaviors and to continue journaling and monitoring of her behaviors regarding texting which she states has been pushing people away.  Advised to follow the recommendation and treatment plan of the therapists while working around these OCD tendencies and behaviors. - Follow-Up: Patient will follow up in 2 weeks - Referrals: [facility] for ADHD testing - Safety Planning:  Patient was educated on her depression and to notify the clinic should she have worsening symptoms.  Patient also notified that should she have suicidal ideation with or without a plan to call 911 or go to the closest emergency department.  Patient denies having a firearm within the home.  Patient/Guardian was advised Release of Information must be obtained prior to any record release in order to collaborate their care with an outside provider. Patient/Guardian was advised if they have not already done so to contact the registration department to sign all necessary forms in order for us  to release information regarding their care.   Consent: Patient/Guardian gives verbal consent for treatment and assignment of benefits for services provided during this visit. Patient/Guardian expressed understanding and agreed to proceed.    Dorn Jama Der, NP 09/14/2024, 8:59 AM

## 2024-09-28 ENCOUNTER — Ambulatory Visit: Admitting: Psychiatry

## 2024-10-05 ENCOUNTER — Encounter: Payer: Self-pay | Admitting: Psychiatry

## 2024-10-05 ENCOUNTER — Ambulatory Visit: Admitting: Psychiatry

## 2024-10-05 DIAGNOSIS — F411 Generalized anxiety disorder: Secondary | ICD-10-CM | POA: Diagnosis not present

## 2024-10-05 DIAGNOSIS — F422 Mixed obsessional thoughts and acts: Secondary | ICD-10-CM | POA: Diagnosis not present

## 2024-10-05 DIAGNOSIS — F314 Bipolar disorder, current episode depressed, severe, without psychotic features: Secondary | ICD-10-CM

## 2024-10-05 MED ORDER — CLONIDINE HCL ER 0.1 MG PO TB12: 0.2000 mg | ORAL_TABLET | Freq: Every day | ORAL | 0 refills | Status: DC

## 2024-10-05 MED ORDER — HYDROXYZINE HCL 25 MG PO TABS: 25.0000 mg | ORAL_TABLET | Freq: Three times a day (TID) | ORAL | 2 refills | Status: DC

## 2024-10-05 MED ORDER — LAMOTRIGINE 150 MG PO TABS: 150.0000 mg | ORAL_TABLET | Freq: Every day | ORAL | 2 refills | Status: DC

## 2024-10-05 NOTE — Progress Notes (Unsigned)
 [facility] MD/PA/NP OP Progress Note  10/05/2024 2:13 PM SAMAURI KELLENBERGER  MRN:  [id number]  Chief Complaint: Routine Follow-up HPI: 38 year old female presents [facility] for follow-up.  Patient reports that her parents have been going away on vacation and states that she has not been at the house but she is feeling okay.  Patient reports that she has not had any anxiety attacks for the last month reporting that she is doing much better and is able to manage her mood and anxiety.  Patient reports that she has not been out the house since her parents have gone and states that she does not really want go to the store or to any locations public without her parents.  Patient is known to be anxious in public and is stating that though she feels that way she feels that she is able to manage her emotions and mood at home.  Patient reports she is seeking disability psychiatric wise because she is unable to work.  Patient reports that paperwork is already sent and is requesting this provider to fill out disability paperwork in which patient is reported that we have been together for 6 months and will fill out paperwork that is necessary.  Patient states that all in all she is very grateful for the interactions with this provider and states that she is feeling much better and states that she is able to get things done around the house and states that she is not feeling as terrible as she initially arrived.  Based on this assessment interview patient will continue medications as prescribed.  See plan.  Patient with no other questions or concerns at this time patient to follow-up in 2 weeks.  Patient denies SI, HI, AVH. Visit Diagnosis:    ICD-10-CM   1. Bipolar disorder with severe depression (HCC)  F31.4 lamoTRIgine  (LAMICTAL ) 150 MG tablet    2. Generalized anxiety disorder  F41.1 hydrOXYzine  (ATARAX ) 25 MG tablet    3. Mixed obsessional thoughts and acts  F42.2 cloNIDine  HCl (KAPVAY ) 0.1 MG TB12 ER tablet      Past  Psychiatric History:  Previous Psych Hospitalizations: Denies   Outpatient treatment: Seen a psychiatrist in 2017, unable to recall the name, currently being manages with primary care with [facility].   Medications Current: -Lamictal  150mg  once daily, for bipolar depression -Lybalvi  10mg /10mg  once daily, for hallucinations.  -Hydroxyzine  25mg  TID as needed for anxiety.  -Clonidine  ER 0.2mg , for ADHD   Medication Trials: - Latuda 40mg , side effects, stopped taking after 8 months, nausea vomiting - Seroquel - 2013, took 2 doses after waking up after taking medications and becoming violent. - Klonopin - 2018, took too many doses in one day, and then encountered stressors and attempted to take a overdose to commit suicide, in which she did not go to the ER, but she reports she took a handful.  -Gabapentin  300mg  BID, 07/20/24, poor response - Cogentin  0,5mg  BID, 07/20/24, oversedating -Zyprexa  7.5mg , weight gain 08/04/24   Suicide & Violence: - Previous suicide attempt in 2018, did not go to the hospital.   Psychotherapy: Not currently participating in therapy.     Legal: Denies  Past Medical History:  Past Medical History:  Diagnosis Date   Anxiety    Asthma    Bipolar 1 disorder (HCC)    CIN III (cervical intraepithelial neoplasia III)    Depression    History of LEEP (loop electrosurgical excision procedure) of cervix complicating pregnancy, second trimester    2012  Vaginal Pap smear, abnormal     Past Surgical History:  Procedure Laterality Date   CHOLECYSTECTOMY     TONSILLECTOMY     TYMPANOSTOMY TUBE PLACEMENT     childhood   URETERAL STENT PLACEMENT      Family Psychiatric History: No additional  Family History: No family history on file.  Social History:  Social History   Socioeconomic History   Marital status: Divorced    Spouse name: Not on file   Number of children: 2   Years of education: 12   Highest education level: Some college, no degree   Occupational History   Not on file  Tobacco Use   Smoking status: Former    Current packs/day: 0.00    Types: Cigarettes    Quit date: 06/10/2020    Years since quitting: 4.3   Smokeless tobacco: Never  Vaping Use   Vaping status: Never Used  Substance and Sexual Activity   Alcohol use: Not Currently    Alcohol/week: 3.0 standard drinks of alcohol    Types: 3 Standard drinks or equivalent per week   Drug use: Not Currently    Types: Marijuana, Cocaine   Sexual activity: Not Currently    Partners: Male    Birth control/protection: Implant  Other Topics Concern   Not on file  Social History Narrative   Not on file   Social Drivers of Health   Financial Resource Strain: Not on file  Food Insecurity: Not on file  Transportation Needs: Not on file  Physical Activity: Not on file  Stress: Not on file  Social Connections: Not on file    Allergies: No Known Allergies  Metabolic Disorder Labs: No results found for: HGBA1C, MPG No results found for: PROLACTIN No results found for: CHOL, TRIG, HDL, CHOLHDL, VLDL, LDLCALC Lab Results  Component Value Date   TSH 2.16 08/13/2012    Therapeutic Level Labs: No results found for: LITHIUM No results found for: VALPROATE No results found for: CBMZ  Current Medications: Current Outpatient Medications  Medication Sig Dispense Refill   albuterol  (PROVENTIL ) (2.5 MG/3ML) 0.083% nebulizer solution Take 3 mLs (2.5 mg total) by nebulization every 6 (six) hours as needed for wheezing or shortness of breath. 75 mL 12   amLODipine (NORVASC) 5 MG tablet Take 5 mg by mouth daily.     cloNIDine  HCl (KAPVAY ) 0.1 MG TB12 ER tablet Take 2 tablets (0.2 mg total) by mouth at bedtime. 60 tablet 0   cyclobenzaprine  (FLEXERIL ) 5 MG tablet Take 5 mg by mouth once as needed for muscle spasms.     etonogestrel  (NEXPLANON ) 68 MG IMPL implant 1 each by Subdermal route once.     hydrOXYzine  (ATARAX ) 25 MG tablet Take 1 tablet  (25 mg total) by mouth 3 (three) times daily. 30 tablet 2   lamoTRIgine  (LAMICTAL ) 150 MG tablet Take 1 tablet (150 mg total) by mouth daily. 30 tablet 2   OLANZapine -Samidorphan (LYBALVI ) 10-10 MG TABS Take 1 tablet by mouth at bedtime. 30 tablet 2   loperamide  (IMODIUM ) 2 MG capsule Take 1 capsule (2 mg total) by mouth 4 (four) times daily as needed for diarrhea or loose stools. (Patient not taking: Reported on 10/05/2024) 12 capsule 0   methylPREDNISolone  (MEDROL  DOSEPAK) 4 MG TBPK tablet Take 6 pills on day one then decrease by 1 pill each day 21 tablet 0   ondansetron  (ZOFRAN -ODT) 4 MG disintegrating tablet Take 1 tablet (4 mg total) by mouth every 8 (eight) hours as needed  for nausea or vomiting. (Patient not taking: Reported on 10/05/2024) 20 tablet 0   No current facility-administered medications for this visit.    Musculoskeletal: Strength & Muscle Tone: within normal limits Gait & Station: normal Patient leans: N/A   Psychiatric Specialty Exam: Review of Systems  Constitutional: Negative.   HENT: Negative.    Eyes: Negative.   Respiratory: Negative.    Cardiovascular: Negative.   Gastrointestinal: Negative.   Endocrine: Negative.   Genitourinary: Negative.   Musculoskeletal: Negative.   Skin: Negative.   Allergic/Immunologic: Negative.   Neurological: Negative.   Hematological: Negative.   Psychiatric/Behavioral:  Positive for dysphoric mood. The patient is nervous/anxious.     Blood pressure 102/70, pulse 66, temperature 98.2 F (36.8 C), temperature source Temporal, height 5' 3.5 (1.613 m), weight 171 lb (77.6 kg), SpO2 100%.Body mass index is 29.82 kg/m.  Blood pressure 116/76, pulse 80, temperature 97.8 F (36.6 C), temperature source Temporal, height 5' 4.25 (1.632 m), weight 169 lb (76.7 kg).Body mass index is 28.78 kg/m.  General Appearance: Well Groomed  Eye Contact:  Good  Speech:  Clear and Coherent  Volume:  Normal  Mood:  Anxious  Affect:   Appropriate  Thought Process:  Coherent  Orientation:  Full (Time, Place, and Person)  Thought Content: Logical   Suicidal Thoughts:  No  Homicidal Thoughts:  No  Memory:  Immediate;   Good Recent;   Good Remote;   Good  Judgement:  Good  Insight:  Good  Psychomotor Activity:  Normal  Concentration:  Concentration: Good and Attention Span: Good  Recall:  Good  Fund of Knowledge: Good  Language: Good  Akathisia:  No  Handed:  Right  AIMS (if indicated): not done  Assets:  Desire for Improvement Financial Resources/Insurance Housing  ADL's:  Intact  Cognition: WNL  Sleep:  Good   Screenings: GAD-7    Flowsheet Row Office Visit from 07/07/2024 in [facility] Visit from 06/09/2024 in [facility] Visit from 05/25/2024 in [facility] Office Visit from 05/11/2024 in [facility] Office Visit from 04/29/2024 in [facility]  Total GAD-7 Score 15 8 15 8 21    PHQ2-9    Flowsheet Row Office Visit from 07/07/2024 in [facility] Office Visit from 06/09/2024 in [facility] Office Visit from 05/25/2024 in [facility] Office Visit from 05/11/2024 in [facility] Office Visit from 04/29/2024 in [facility]  PHQ-2 Total Score 4 2 2 3 6   PHQ-9 Total Score 16 7 13 11 26    Flowsheet Row Office Visit from 07/07/2024 in [facility] Office Visit from 06/09/2024 in [facility] Office Visit from 05/25/2024 in [facility]  C-SSRS RISK CATEGORY No Risk No Risk No Risk     Assessment  and Plan:  Assessment - Diagnosis: Bipolar disorder with severe depression (HCC) [F31.4]  2. Generalized anxiety disorder [F41.1]  3. Mixed obsessional thoughts and acts [F42.2]    Differential Diagnosis: Borderline Personality Disorder, recurrent abandonment   - Progress: Patient reporting that she her symptoms are greatly improving stating no auditory hallucinations as well as no sleeping issues stating that she is sleeping from 10 to 7 AM every night.  Patient is reporting improvement  in her daily life but states that she has continued to have anxiety. - Risk Factors: Suicidal risk, worsening symptoms  Plan - Medications:  Continue Lamictal  to 150 mg by mouth once daily.  Patient was educated on the importance of monitoring for rashes and to stop taking medication should a rash develop and to contact the clinic.  Patient also reported nausea and vomiting as well as headache and sedation advised to take at night as well as to wait for medication on the symptoms, please notify clinic if symptoms last longer than 1 week. Increase Lybalvi  10mg /10mg  for hallucinations, and due to weight concerns as she has gained 30lb since starting Zyprexa .   Pt has been educated of Tardive Dyskinesia and abnormal movements, pt to call the provider if she experience symptoms and to stop the medication.  Continue Clonidine  ER 0.2mg  once daily before bed, for ADHD like symptoms utilizing non-stimulant due to Bipolar diagnosis, pt has been educated of the risk of hypotension and instructed to chjeck BP first if she feels dizzy, sick, or lethargic.  4.  Continue Taking Hydroxyzine  TID as needed for anxiety.   - Psychotherapy: Patient placed on a wait list here at [facility] and is waiting for in person sessions. - Education: Patient educated on importance of monitoring for rashes with Lamictal .  Patient educated on suicide risk in which she agrees that she will call 911 or go to emergency department should she have  suicidal thoughts with or without a plan.  To educate on behaviors and to continue journaling and monitoring of her behaviors regarding texting which she states has been pushing people away.  Advised to follow the recommendation and treatment plan of the therapists while working around these OCD tendencies and behaviors. - Follow-Up: Patient will follow up in 2 weeks - Referrals: [facility] for ADHD testing - Safety Planning: Patient was educated on her depression and to notify the clinic should she have worsening symptoms.  Patient also notified that should she have suicidal ideation with or without a plan to call 911 or go to the closest emergency department.  Patient denies having a firearm within the home.  Patient/Guardian was advised Release of Information must be obtained prior to any record release in order to collaborate their care with an outside provider. Patient/Guardian was advised if they have not already done so to contact the registration department to sign all necessary forms in order for us  to release information regarding their care.   Consent: Patient/Guardian gives verbal consent for treatment and assignment of benefits for services provided during this visit. Patient/Guardian expressed understanding and agreed to proceed.    Dorn Jama Der, NP 10/05/2024, 2:13 PM

## 2024-10-27 ENCOUNTER — Ambulatory Visit: Admitting: Psychiatry

## 2024-11-02 ENCOUNTER — Other Ambulatory Visit: Payer: Self-pay

## 2024-11-02 ENCOUNTER — Ambulatory Visit (INDEPENDENT_AMBULATORY_CARE_PROVIDER_SITE_OTHER): Admitting: Psychiatry

## 2024-11-02 ENCOUNTER — Encounter: Payer: Self-pay | Admitting: Psychiatry

## 2024-11-02 VITALS — BP 118/79 | HR 61 | Temp 97.3°F | Ht 63.5 in | Wt 182.2 lb

## 2024-11-02 DIAGNOSIS — F314 Bipolar disorder, current episode depressed, severe, without psychotic features: Secondary | ICD-10-CM | POA: Diagnosis not present

## 2024-11-02 DIAGNOSIS — F422 Mixed obsessional thoughts and acts: Secondary | ICD-10-CM

## 2024-11-02 DIAGNOSIS — F411 Generalized anxiety disorder: Secondary | ICD-10-CM | POA: Diagnosis not present

## 2024-11-02 NOTE — Progress Notes (Signed)
 [facility] MD/PA/NP OP Progress Note  11/02/2024 10:04 AM Wendy Holmes  MRN:  [id number]  Chief Complaint:  Chief Complaint  Patient presents with   Follow-up   HPI: 38 year old female presenting [facility] for follow-up.  Patient reports that she is doing well at her home stating that the medications are doing very well and she is happy with her current regimen.  Patient reports she went to go see her primary care doctor in which they placed her on appetite suppressant medication which is also anticonvulsant, in which she has explained to that provider to share currently she is on Lamictal .  Patient reports that the provider is managing her weight gain as she has gained still quite a bit of weight in which she stated she is unable to fit some of her close.  Patient states all in all those that she is happy with her anxiety as well as her hyperactivity and akathisia like symptoms.  Patient reports she does not want to make any medication changes at this time and reporting that family life is good as well that she is actually able to tolerate stress and reports that even though her daughter moved down she did not overreact whatsoever.  Patient is currently satisfied with his medication regimen.  Patient is gena start the other medications from the other provider and will follow-up in 2 weeks to ensure no interaction is made with topiramate which was provided by her regular doctor.  Patient with no other questions or concerns at this time.  Patient is in agreement with treatment plan.  Patient denies SI, HI, AVH. Visit Diagnosis:    ICD-10-CM   1. Bipolar disorder with severe depression (HCC)  F31.4     2. Generalized anxiety disorder  F41.1     3. Mixed obsessional thoughts and acts  F42.2       Past Psychiatric History:  Previous Psych Hospitalizations: Denies   Outpatient treatment: Seen a psychiatrist in 2017, unable to recall the name, currently being manages with primary care with [facility].    Medications Current: -Lamictal  150mg  once daily, for bipolar depression -Lybalvi  10mg /10mg  once daily, for hallucinations.  -Hydroxyzine  25mg  TID as needed for anxiety.  -Clonidine  ER 0.2mg , for ADHD   Medication Trials: - Latuda 40mg , side effects, stopped taking after 8 months, nausea vomiting - Seroquel - 2013, took 2 doses after waking up after taking medications and becoming violent. - Klonopin - 2018, took too many doses in one day, and then encountered stressors and attempted to take a overdose to commit suicide, in which she did not go to the ER, but she reports she took a handful.  -Gabapentin  300mg  BID, 07/20/24, poor response - Cogentin  0,5mg  BID, 07/20/24, oversedating -Zyprexa  7.5mg , weight gain 08/04/24   Suicide & Violence: - Previous suicide attempt in 2018, did not go to the hospital.   Psychotherapy: Not currently participating in therapy.     Legal: Denies  Past Medical History:  Past Medical History:  Diagnosis Date   Anxiety    Asthma    Bipolar 1 disorder (HCC)    CIN III (cervical intraepithelial neoplasia III)    Depression    History of LEEP (loop electrosurgical excision procedure) of cervix complicating pregnancy, second trimester    2012   Vaginal Pap smear, abnormal     Past Surgical History:  Procedure Laterality Date   CHOLECYSTECTOMY     TONSILLECTOMY     TYMPANOSTOMY TUBE PLACEMENT     childhood  URETERAL STENT PLACEMENT      Family Psychiatric History: No additional  Family History: History reviewed. No pertinent family history.  Social History:  Social History   Socioeconomic History   Marital status: Divorced    Spouse name: Not on file   Number of children: 2   Years of education: 12   Highest education level: Some college, no degree  Occupational History   Not on file  Tobacco Use   Smoking status: Former    Current packs/day: 0.00    Types: Cigarettes    Quit date: 06/10/2020    Years since quitting: 4.4   Smokeless  tobacco: Never  Vaping Use   Vaping status: Never Used  Substance and Sexual Activity   Alcohol use: Not Currently    Alcohol/week: 3.0 standard drinks of alcohol    Types: 3 Standard drinks or equivalent per week   Drug use: Not Currently    Types: Marijuana, Cocaine   Sexual activity: Not Currently    Partners: Male    Birth control/protection: Implant  Other Topics Concern   Not on file  Social History Narrative   Not on file   Social Drivers of Health   Financial Resource Strain: Not on file  Food Insecurity: Not on file  Transportation Needs: Not on file  Physical Activity: Not on file  Stress: Not on file  Social Connections: Not on file    Allergies: No Known Allergies  Metabolic Disorder Labs: No results found for: HGBA1C, MPG No results found for: PROLACTIN No results found for: CHOL, TRIG, HDL, CHOLHDL, VLDL, LDLCALC Lab Results  Component Value Date   TSH 2.16 08/13/2012    Therapeutic Level Labs: No results found for: LITHIUM No results found for: VALPROATE No results found for: CBMZ  Current Medications: Current Outpatient Medications  Medication Sig Dispense Refill   albuterol  (PROVENTIL ) (2.5 MG/3ML) 0.083% nebulizer solution Take 3 mLs (2.5 mg total) by nebulization every 6 (six) hours as needed for wheezing or shortness of breath. 75 mL 12   amLODipine (NORVASC) 5 MG tablet Take 5 mg by mouth daily.     cloNIDine  HCl (KAPVAY ) 0.1 MG TB12 ER tablet Take 2 tablets (0.2 mg total) by mouth at bedtime. 60 tablet 0   cyclobenzaprine  (FLEXERIL ) 5 MG tablet Take 5 mg by mouth once as needed for muscle spasms.     etonogestrel  (NEXPLANON ) 68 MG IMPL implant 1 each by Subdermal route once.     hydrOXYzine  (ATARAX ) 25 MG tablet Take 1 tablet (25 mg total) by mouth 3 (three) times daily. 30 tablet 2   lamoTRIgine  (LAMICTAL ) 150 MG tablet Take 1 tablet (150 mg total) by mouth daily. 30 tablet 2   loperamide  (IMODIUM ) 2 MG capsule Take 1  capsule (2 mg total) by mouth 4 (four) times daily as needed for diarrhea or loose stools. (Patient not taking: Reported on 10/05/2024) 12 capsule 0   methylPREDNISolone  (MEDROL  DOSEPAK) 4 MG TBPK tablet Take 6 pills on day one then decrease by 1 pill each day 21 tablet 0   OLANZapine -Samidorphan (LYBALVI ) 10-10 MG TABS Take 1 tablet by mouth at bedtime. 30 tablet 2   ondansetron  (ZOFRAN -ODT) 4 MG disintegrating tablet Take 1 tablet (4 mg total) by mouth every 8 (eight) hours as needed for nausea or vomiting. (Patient not taking: Reported on 10/05/2024) 20 tablet 0   No current facility-administered medications for this visit.     Musculoskeletal: Strength & Muscle Tone: within normal limits Gait &  Station: normal Patient leans: N/A   Psychiatric Specialty Exam: Review of Systems  Constitutional: Negative.   HENT: Negative.    Eyes: Negative.   Respiratory: Negative.    Cardiovascular: Negative.   Gastrointestinal: Negative.   Endocrine: Negative.   Genitourinary: Negative.   Musculoskeletal: Negative.   Skin: Negative.   Allergic/Immunologic: Negative.   Neurological: Negative.   Hematological: Negative.   Psychiatric/Behavioral:  Positive for dysphoric mood. The patient is nervous/anxious.     Blood pressure 102/70, pulse 66, temperature 98.2 F (36.8 C), temperature source Temporal, height 5' 3.5 (1.613 m), weight 171 lb (77.6 kg), SpO2 100%.Body mass index is 29.82 kg/m.   Vitals:   11/02/24 0936  BP: 118/79  Pulse: 61  Temp: (!) 97.3 F (36.3 C)     General Appearance: Well Groomed  Eye Contact:  Good  Speech:  Clear and Coherent  Volume:  Normal  Mood:  Anxious  Affect:  Appropriate  Thought Process:  Coherent  Orientation:  Full (Time, Place, and Person)  Thought Content: Logical   Suicidal Thoughts:  No  Homicidal Thoughts:  No  Memory:  Immediate;   Good Recent;   Good Remote;   Good  Judgement:  Good  Insight:  Good  Psychomotor Activity:  Normal   Concentration:  Concentration: Good and Attention Span: Good  Recall:  Good  Fund of Knowledge: Good  Language: Good  Akathisia:  No  Handed:  Right  AIMS (if indicated): not done  Assets:  Desire for Improvement Financial Resources/Insurance Housing  ADL's:  Intact  Cognition: WNL  Sleep:  Good   Screenings: GAD-7    Flowsheet Row Office Visit from 07/07/2024 in [facility] Office Visit from 06/09/2024 in [facility] Office Visit from 05/25/2024 in [facility] Office Visit from 05/11/2024 in [facility] Office Visit from 04/29/2024 in [facility]  Total GAD-7 Score 15 8 15 8 21    PHQ2-9    Flowsheet Row Office Visit from 07/07/2024 in [facility] Office Visit from 06/09/2024 in [facility] Office Visit from 05/25/2024 in [facility] Office Visit from 05/11/2024 in [facility] Office Visit from 04/29/2024 in [facility]  PHQ-2 Total Score 4 2 2 3 6   PHQ-9 Total Score 16 7 13 11 26    Flowsheet Row Office Visit from 07/07/2024 in [facility] Office Visit from 06/09/2024 in [facility] Office Visit from 05/25/2024 in [facility]  C-SSRS RISK CATEGORY No Risk No Risk No Risk     Assessment and Plan:  Assessment - Diagnosis: Bipolar disorder with severe depression (HCC) [F31.4]  2. Generalized anxiety disorder [F41.1]  3. Mixed obsessional thoughts and acts [F42.2]    Differential Diagnosis: Borderline Personality Disorder, recurrent abandonment   - Progress: Patient  reporting that she her symptoms are greatly improving stating no auditory hallucinations as well as no sleeping issues stating that she is sleeping from 10 to 7 AM every night.  Patient is reporting improvement in her daily life but states that she has continued to have anxiety. - Risk Factors: Suicidal risk, worsening symptoms  Plan - Medications:  Continue Lamictal  to 150 mg by mouth once daily.  Patient was educated on the importance  of monitoring for rashes and to stop taking medication should a rash develop and to contact the clinic.  Patient also reported nausea and vomiting as well as headache and sedation advised to take at night as well as to wait for medication on the symptoms, please notify clinic if symptoms last longer than 1 week. Increase Lybalvi  10mg /10mg  for hallucinations, and due to weight concerns as she has gained 30lb since starting Zyprexa .   Pt has been educated of Tardive Dyskinesia and abnormal movements, pt to call the provider if she experience symptoms and to stop the medication.  Continue Clonidine  ER 0.2mg  once daily before bed, for ADHD like symptoms utilizing non-stimulant due to Bipolar diagnosis, pt has been educated of the risk of hypotension and instructed to chjeck BP first if she feels dizzy, sick, or lethargic.  4.  Continue Taking Hydroxyzine  TID as needed for anxiety.   - Psychotherapy: Patient placed on a wait list here at [facility] and is waiting for in person sessions. - Education: Patient educated on importance of monitoring for rashes with Lamictal .  Patient educated on suicide risk in which she agrees that she will call 911 or go to emergency department should she have suicidal thoughts with or without a plan.  To educate on behaviors and to continue journaling and monitoring of her behaviors regarding texting which she states has been pushing people away.  Advised to follow the recommendation and treatment plan of the therapists while working around these OCD  tendencies and behaviors. - Follow-Up: Patient will follow up in 2 weeks - Referrals: [facility] for ADHD testing - Safety Planning: Patient was educated on her depression and to notify the clinic should she have worsening symptoms.  Patient also notified that should she have suicidal ideation with or without a plan to call 911 or go to the closest emergency department.  Patient denies having a firearm within the home.  Patient/Guardian was advised Release of Information must be obtained prior to any record release in order to collaborate their care with an outside provider. Patient/Guardian was advised if they have not already done so to contact the registration department to sign all necessary forms in order for us  to release information regarding their care.   Consent: Patient/Guardian gives verbal consent for treatment and assignment of benefits for services provided during this visit. Patient/Guardian expressed understanding and agreed to proceed.    Dorn Jama Der, NP 11/02/2024, 10:04 AM

## 2024-11-05 ENCOUNTER — Telehealth: Payer: Self-pay | Admitting: Psychiatry

## 2024-11-05 ENCOUNTER — Other Ambulatory Visit: Payer: Self-pay | Admitting: Psychiatry

## 2024-11-05 DIAGNOSIS — F422 Mixed obsessional thoughts and acts: Secondary | ICD-10-CM

## 2024-11-05 MED ORDER — CLONIDINE HCL ER 0.1 MG PO TB12: 0.2000 mg | ORAL_TABLET | Freq: Every day | ORAL | 2 refills | Status: AC

## 2024-11-16 ENCOUNTER — Ambulatory Visit (INDEPENDENT_AMBULATORY_CARE_PROVIDER_SITE_OTHER): Admitting: Psychiatry

## 2024-11-16 ENCOUNTER — Encounter: Payer: Self-pay | Admitting: Psychiatry

## 2024-11-16 ENCOUNTER — Other Ambulatory Visit: Payer: Self-pay

## 2024-11-16 VITALS — BP 109/70 | HR 64 | Temp 97.7°F | Ht 63.5 in | Wt 178.6 lb

## 2024-11-16 DIAGNOSIS — F314 Bipolar disorder, current episode depressed, severe, without psychotic features: Secondary | ICD-10-CM

## 2024-11-16 DIAGNOSIS — F422 Mixed obsessional thoughts and acts: Secondary | ICD-10-CM

## 2024-11-16 DIAGNOSIS — F411 Generalized anxiety disorder: Secondary | ICD-10-CM | POA: Diagnosis not present

## 2024-11-16 NOTE — Progress Notes (Signed)
 [facility] MD/PA/NP OP Progress Note  11/16/2024 9:11 AM Wendy Holmes  MRN:  [id number]  Chief Complaint:  Chief Complaint  Patient presents with   Follow-up   HPI: 38 year old female presenting [facility] for follow-up.  Patient reports that she is doing well stating that the medications are doing very well now that she is finding some stability.  Patient is dressed up to go out onto town with her folks in which she is spending time with her mother and father.  Patient reports she is also going to see family and friends.  Patient reports that she her anxiety is under control but states she does have to utilize hydroxyzine  at times which is correlated with how often she has to meet new people.  Patient reports she does take it up to 3 times a day sometimes.  Patient reports a good relationship at home with her parents, her son and as well as her daughter who has not reached out to her just yet but she has not had any panic attacks.  Patient does not appear to have any kind of jitteriness or nervousness and appears to be relaxed.  Patient with no other question concerns at this time.  Patient is in agreement with treatment plan.  Patient is to follow-up in 2 weeks.  Patient is can be transitioning with this provider in which she has to practice starting virtual visits on her phone.  And which she was in agreement with.  Patient denies SI, HI, AVH. Visit Diagnosis:    ICD-10-CM   1. Mixed obsessional thoughts and acts  F42.2     2. Bipolar disorder with severe depression (HCC)  F31.4     3. Generalized anxiety disorder  F41.1       Past Psychiatric History:  Previous Psych Hospitalizations: Denies   Outpatient treatment: Seen a psychiatrist in 2017, unable to recall the name, currently being manages with primary care with [facility].   Medications Current: -Lamictal  150mg  once daily, for bipolar depression -Lybalvi  10mg /10mg  once daily, for hallucinations.  -Hydroxyzine  25mg  TID as needed for  anxiety.  -Clonidine  ER 0.2mg , for ADHD   Medication Trials: - Latuda 40mg , side effects, stopped taking after 8 months, nausea vomiting - Seroquel - 2013, took 2 doses after waking up after taking medications and becoming violent. - Klonopin - 2018, took too many doses in one day, and then encountered stressors and attempted to take a overdose to commit suicide, in which she did not go to the ER, but she reports she took a handful.  -Gabapentin  300mg  BID, 07/20/24, poor response - Cogentin  0,5mg  BID, 07/20/24, oversedating -Zyprexa  7.5mg , weight gain 08/04/24   Suicide & Violence: - Previous suicide attempt in 2018, did not go to the hospital.   Psychotherapy: Not currently participating in therapy.     Legal: Denies  Past Medical History:  Past Medical History:  Diagnosis Date   Anxiety    Asthma    Bipolar 1 disorder (HCC)    CIN III (cervical intraepithelial neoplasia III)    Depression    History of LEEP (loop electrosurgical excision procedure) of cervix complicating pregnancy, second trimester    2012   Vaginal Pap smear, abnormal     Past Surgical History:  Procedure Laterality Date   CHOLECYSTECTOMY     TONSILLECTOMY     TYMPANOSTOMY TUBE PLACEMENT     childhood   URETERAL STENT PLACEMENT      Family Psychiatric History: No additional  Family History:  No family history on file.  Social History:  Social History   Socioeconomic History   Marital status: Divorced    Spouse name: Not on file   Number of children: 2   Years of education: 12   Highest education level: Some college, no degree  Occupational History   Not on file  Tobacco Use   Smoking status: Former    Current packs/day: 0.00    Types: Cigarettes    Quit date: 06/10/2020    Years since quitting: 4.4   Smokeless tobacco: Never  Vaping Use   Vaping status: Never Used  Substance and Sexual Activity   Alcohol use: Not Currently    Alcohol/week: 3.0 standard drinks of alcohol    Types: 3  Standard drinks or equivalent per week   Drug use: Not Currently    Types: Marijuana, Cocaine   Sexual activity: Not Currently    Partners: Male    Birth control/protection: Implant  Other Topics Concern   Not on file  Social History Narrative   Not on file   Social Drivers of Health   Financial Resource Strain: Not on file  Food Insecurity: Not on file  Transportation Needs: Not on file  Physical Activity: Not on file  Stress: Not on file  Social Connections: Not on file    Allergies: No Known Allergies  Metabolic Disorder Labs: No results found for: HGBA1C, MPG No results found for: PROLACTIN No results found for: CHOL, TRIG, HDL, CHOLHDL, VLDL, LDLCALC Lab Results  Component Value Date   TSH 2.16 08/13/2012    Therapeutic Level Labs: No results found for: LITHIUM No results found for: VALPROATE No results found for: CBMZ  Current Medications: Current Outpatient Medications  Medication Sig Dispense Refill   topiramate (TOPAMAX) 25 MG tablet Take 25 mg by mouth 2 (two) times daily.     albuterol  (PROVENTIL ) (2.5 MG/3ML) 0.083% nebulizer solution Take 3 mLs (2.5 mg total) by nebulization every 6 (six) hours as needed for wheezing or shortness of breath. 75 mL 12   amLODipine (NORVASC) 5 MG tablet Take 5 mg by mouth daily.     cloNIDine  HCl (KAPVAY ) 0.1 MG TB12 ER tablet Take 2 tablets (0.2 mg total) by mouth at bedtime. 60 tablet 2   cyclobenzaprine  (FLEXERIL ) 5 MG tablet Take 5 mg by mouth once as needed for muscle spasms.     etonogestrel  (NEXPLANON ) 68 MG IMPL implant 1 each by Subdermal route once.     hydrOXYzine  (ATARAX ) 25 MG tablet Take 1 tablet (25 mg total) by mouth 3 (three) times daily. 30 tablet 2   lamoTRIgine  (LAMICTAL ) 150 MG tablet Take 1 tablet (150 mg total) by mouth daily. 30 tablet 2   loperamide  (IMODIUM ) 2 MG capsule Take 1 capsule (2 mg total) by mouth 4 (four) times daily as needed for diarrhea or loose stools. (Patient  not taking: Reported on 10/05/2024) 12 capsule 0   methylPREDNISolone  (MEDROL  DOSEPAK) 4 MG TBPK tablet Take 6 pills on day one then decrease by 1 pill each day 21 tablet 0   OLANZapine -Samidorphan (LYBALVI ) 10-10 MG TABS Take 1 tablet by mouth at bedtime. 30 tablet 2   ondansetron  (ZOFRAN -ODT) 4 MG disintegrating tablet Take 1 tablet (4 mg total) by mouth every 8 (eight) hours as needed for nausea or vomiting. (Patient not taking: Reported on 10/05/2024) 20 tablet 0   No current facility-administered medications for this visit.     Musculoskeletal: Strength & Muscle Tone: within normal limits Gait &  Station: normal Patient leans: N/A   Psychiatric Specialty Exam: Review of Systems  Constitutional: Negative.   HENT: Negative.    Eyes: Negative.   Respiratory: Negative.    Cardiovascular: Negative.   Gastrointestinal: Negative.   Endocrine: Negative.   Genitourinary: Negative.   Musculoskeletal: Negative.   Skin: Negative.   Allergic/Immunologic: Negative.   Neurological: Negative.   Hematological: Negative.   Psychiatric/Behavioral:  Positive for dysphoric mood. The patient is nervous/anxious.     Today's Vitals   11/16/24 0908  BP: 109/70  Pulse: 64  Temp: 97.7 F (36.5 C)  TempSrc: Temporal  Weight: 178 lb 9.6 oz (81 kg)  Height: 5' 3.5 (1.613 m)   Body mass index is 31.14 kg/m.   General Appearance: Well Groomed  Eye Contact:  Good  Speech:  Clear and Coherent  Volume:  Normal  Mood:  Anxious  Affect:  Appropriate  Thought Process:  Coherent  Orientation:  Full (Time, Place, and Person)  Thought Content: Logical   Suicidal Thoughts:  No  Homicidal Thoughts:  No  Memory:  Immediate;   Good Recent;   Good Remote;   Good  Judgement:  Good  Insight:  Good  Psychomotor Activity:  Normal  Concentration:  Concentration: Good and Attention Span: Good  Recall:  Good  Fund of Knowledge: Good  Language: Good  Akathisia:  No  Handed:  Right  AIMS (if  indicated): not done  Assets:  Desire for Improvement Financial Resources/Insurance Housing  ADL's:  Intact  Cognition: WNL  Sleep:  Good   Screenings: GAD-7    Flowsheet Row Office Visit from 07/07/2024 in [facility] Office Visit from 06/09/2024 in [facility] Office Visit from 05/25/2024 in [facility] Office Visit from 05/11/2024 in [facility] Office Visit from 04/29/2024 in [facility]  Total GAD-7 Score 15 8 15 8 21    PHQ2-9    Flowsheet Row Office Visit from 07/07/2024 in [facility] Office Visit from 06/09/2024 in [facility] Office Visit from 05[facility]25/2024 in [facility] Office Visit from 05/11/2024 in [facility] Office Visit from 04/29/2024 in [facility]  PHQ-2 Total Score 4 2 2 3 6   PHQ-9 Total Score 16 7 13 11 26    Flowsheet Row Office Visit from 07/07/2024 in [facility] Office Visit from 06/09/2024 in [facility] Office Visit from 05/25/2024 in [facility]  C-SSRS RISK CATEGORY No Risk No Risk No Risk     Assessment and Plan:  Assessment - Diagnosis: Bipolar disorder with severe depression (HCC) [F31.4]  2. Generalized anxiety disorder [F41.1]  3. Mixed obsessional thoughts and acts [F42.2]    Differential Diagnosis: Borderline Personality Disorder, recurrent abandonment   - Progress: Patient reporting that she her symptoms are greatly improving stating no auditory hallucinations as well as no sleeping issues stating that she is sleeping from 10 to 7 AM every  night.  Patient is reporting improvement in her daily life but states that she has continued to have anxiety. - Risk Factors: Suicidal risk, worsening symptoms  Plan - Medications:  Continue Lamictal  to 150 mg by mouth once daily.  Patient was educated on the importance of monitoring for rashes and to stop taking medication  should a rash develop and to contact the clinic.  Patient also reported nausea and vomiting as well as headache and sedation advised to take at night as well as to wait for medication on the symptoms, please notify clinic if symptoms last longer than 1 week. Continue Lybalvi  10mg /10mg  for hallucinations, and due to weight concerns as she has gained 30lb since starting Zyprexa .   Pt has been educated of Tardive Dyskinesia and abnormal movements, pt to call the provider if she experience symptoms and to stop the medication.  Continue Clonidine  ER 0.2mg  once daily before bed, for ADHD like symptoms utilizing non-stimulant due to Bipolar diagnosis, pt has been educated of the risk of hypotension and instructed to chjeck BP first if she feels dizzy, sick, or lethargic.  4.  Continue Taking Hydroxyzine  TID as needed for anxiety.   - Psychotherapy: Patient placed on a wait list here at [facility] and is waiting for in person sessions. - Education: Patient educated on importance of monitoring for rashes with Lamictal .  Patient educated on suicide risk in which she agrees that she will call 911 or go to emergency department should she have suicidal thoughts with or without a plan.  To educate on behaviors and to continue journaling and monitoring of her behaviors regarding texting which she states has been pushing people away.  Advised to follow the recommendation and treatment plan of the therapists while working around these OCD tendencies and behaviors. - Follow-Up: Patient will follow up in 2 weeks - Referrals: [facility] for ADHD testing - Safety Planning: Patient was educated on her  depression and to notify the clinic should she have worsening symptoms.  Patient also notified that should she have suicidal ideation with or without a plan to call 911 or go to the closest emergency department.  Patient denies having a firearm within the home.   Patient/Guardian was advised Release of Information must be obtained prior to any record release in order to collaborate their care with an outside provider. Patient/Guardian was advised if they have not already done so to contact the registration department to sign all necessary forms in order for us  to release information regarding their care.   Consent: Patient/Guardian gives verbal consent for treatment and assignment of benefits for services provided during this visit. Patient/Guardian expressed understanding and agreed to proceed.    Dorn Jama Der, NP 11/16/2024, 9:11 AM

## 2024-11-30 ENCOUNTER — Telehealth: Admitting: Psychiatry

## 2024-11-30 ENCOUNTER — Ambulatory Visit: Admitting: Psychiatry

## 2024-11-30 DIAGNOSIS — F411 Generalized anxiety disorder: Secondary | ICD-10-CM

## 2024-11-30 DIAGNOSIS — F422 Mixed obsessional thoughts and acts: Secondary | ICD-10-CM

## 2024-11-30 DIAGNOSIS — F314 Bipolar disorder, current episode depressed, severe, without psychotic features: Secondary | ICD-10-CM

## 2024-11-30 MED ORDER — LAMOTRIGINE 150 MG PO TABS: 150.0000 mg | ORAL_TABLET | Freq: Every day | ORAL | 2 refills | Status: AC

## 2024-11-30 MED ORDER — HYDROXYZINE HCL 25 MG PO TABS: 25.0000 mg | ORAL_TABLET | Freq: Three times a day (TID) | ORAL | 2 refills | Status: AC

## 2024-11-30 NOTE — Progress Notes (Signed)
 [facility] MD/PA/NP OP Progress Note  11/30/2024 2:08 PM Wendy Holmes  MRN:  [id number]  Chief Complaint: Routine Follow-up  Virtual Visit via Video Note  I connected with Wendy Holmes on 11/30/24 at  2:00 PM EST by a video enabled telemedicine application and verified that I am speaking with the correct person using two identifiers.  Location: Patient: Wendy Holmes [address]  [place] [address]  Provider: [facility] Office of Provider   I discussed the limitations of evaluation and management by telemedicine and the availability of in person appointments. The patient expressed understanding and agreed to proceed.    I discussed the assessment and treatment plan with the patient. The patient was provided an opportunity to ask questions and all were answered. The patient agreed with the plan and demonstrated an understanding of the instructions.   The patient was advised to call back or seek an in-person evaluation if the symptoms worsen or if the condition fails to improve as anticipated.  I provided 30 minutes of non-face-to-face time during this encounter.   Wendy Jama Der, NP   HPI: 38 year old female with who is having a follow-up.  Pt reports that she has been having issues with depression stating that she is feeling like her old self in the past.  Patient reports that she has been staying inside for quite a bit reporting that she has been feeling more anxious and taking her medications more often of hydroxyzine  up to 3 times a day.  Patient reports that majority of her anxiety is coming from the fact that she is having to meet other people as well as trying to keep up appearances with family.  Patient reports that she is still anxious the fact that she is not seeing the family she really cares about which includes her daughter but reports that all in all she is feeling a little bit better in regards to how to manage her emotions and her needs.  Patient also reports that she is okay  currently with the current medication management but hopes that she can feel better soon.  Patient has been encouraged to utilize deep breathing as well as stepping outside and walking outside of fresh air as we have found that she really enjoys being outside.  Patient does endorse and agree that since she has been outside because has been so cold she should try to go outside and get some fresh air.  Based on this assessment interview patient will follow-up in 2 weeks.  Patient reports that she is doing well stating that she will focus on her coping strategies to feel better.  Patient has been provided a new refill of medications.  See plan.  Patient with no other question concerns.  Patient denies SI, HI, AVH.  Patient to follow-up in 2 weeks. Visit Diagnosis:    ICD-10-CM   1. Mixed obsessional thoughts and acts  F42.2     2. Bipolar disorder with severe depression (HCC)  F31.4     3. Generalized anxiety disorder  F41.1       Past Psychiatric History:  Previous Psych Hospitalizations: Denies   Outpatient treatment: Seen a psychiatrist in 2017, unable to recall the name, currently being manages with primary care with [facility].   Medications Current: -Lamictal  150mg  once daily, for bipolar depression -Lybalvi  10mg /10mg  once daily, for hallucinations.  -Hydroxyzine  25mg  TID as needed for anxiety.  -Clonidine  ER 0.2mg , for ADHD   Medication Trials: - Latuda 40mg ,  side effects, stopped taking after 8 months, nausea vomiting - Seroquel - 2013, took 2 doses after waking up after taking medications and becoming violent. - Klonopin - 2018, took too many doses in one day, and then encountered stressors and attempted to take a overdose to commit suicide, in which she did not go to the ER, but she reports she took a handful.  -Gabapentin  300mg  BID, 07/20/24, poor response - Cogentin  0,5mg  BID, 07/20/24, oversedating -Zyprexa  7.5mg , weight gain 08/04/24   Suicide & Violence: - Previous suicide  attempt in 2018, did not go to the hospital.   Psychotherapy: Not currently participating in therapy.     Legal: Denies  Past Medical History:  Past Medical History:  Diagnosis Date   Anxiety    Asthma    Bipolar 1 disorder (HCC)    CIN III (cervical intraepithelial neoplasia III)    Depression    History of LEEP (loop electrosurgical excision procedure) of cervix complicating pregnancy, second trimester    2012   Vaginal Pap smear, abnormal     Past Surgical History:  Procedure Laterality Date   CHOLECYSTECTOMY     TONSILLECTOMY     TYMPANOSTOMY TUBE PLACEMENT     childhood   URETERAL STENT PLACEMENT      Family Psychiatric History: No additional  Family History: No family history on file.  Social History:  Social History   Socioeconomic History   Marital status: Divorced    Spouse name: Not on file   Number of children: 2   Years of education: 12   Highest education level: Some college, no degree  Occupational History   Not on file  Tobacco Use   Smoking status: Former    Current packs/day: 0.00    Types: Cigarettes    Quit date: 06/10/2020    Years since quitting: 4.4   Smokeless tobacco: Never  Vaping Use   Vaping status: Never Used  Substance and Sexual Activity   Alcohol use: Not Currently    Alcohol/week: 3.0 standard drinks of alcohol    Types: 3 Standard drinks or equivalent per week   Drug use: Not Currently    Types: Marijuana, Cocaine   Sexual activity: Not Currently    Partners: Male    Birth control/protection: Implant  Other Topics Concern   Not on file  Social History Narrative   Not on file   Social Drivers of Health   Financial Resource Strain: Not on file  Food Insecurity: Not on file  Transportation Needs: Not on file  Physical Activity: Not on file  Stress: Not on file  Social Connections: Not on file    Allergies: No Known Allergies  Metabolic Disorder Labs: No results found for: HGBA1C, MPG No results found for:  PROLACTIN No results found for: CHOL, TRIG, HDL, CHOLHDL, VLDL, LDLCALC Lab Results  Component Value Date   TSH 2.16 08/13/2012    Therapeutic Level Labs: No results found for: LITHIUM No results found for: VALPROATE No results found for: CBMZ  Current Medications: Current Outpatient Medications  Medication Sig Dispense Refill   albuterol  (PROVENTIL ) (2.5 MG/3ML) 0.083% nebulizer solution Take 3 mLs (2.5 mg total) by nebulization every 6 (six) hours as needed for wheezing or shortness of breath. 75 mL 12   amLODipine (NORVASC) 5 MG tablet Take 5 mg by mouth daily.     cloNIDine  HCl (KAPVAY ) 0.1 MG TB12 ER tablet Take 2 tablets (0.2 mg total) by mouth at bedtime. 60 tablet 2  cyclobenzaprine  (FLEXERIL ) 5 MG tablet Take 5 mg by mouth once as needed for muscle spasms.     etonogestrel  (NEXPLANON ) 68 MG IMPL implant 1 each by Subdermal route once.     hydrOXYzine  (ATARAX ) 25 MG tablet Take 1 tablet (25 mg total) by mouth 3 (three) times daily. 30 tablet 2   lamoTRIgine  (LAMICTAL ) 150 MG tablet Take 1 tablet (150 mg total) by mouth daily. 30 tablet 2   loperamide  (IMODIUM ) 2 MG capsule Take 1 capsule (2 mg total) by mouth 4 (four) times daily as needed for diarrhea or loose stools. (Patient not taking: Reported on 10/05/2024) 12 capsule 0   methylPREDNISolone  (MEDROL  DOSEPAK) 4 MG TBPK tablet Take 6 pills on day one then decrease by 1 pill each day 21 tablet 0   OLANZapine -Samidorphan (LYBALVI ) 10-10 MG TABS Take 1 tablet by mouth at bedtime. 30 tablet 2   ondansetron  (ZOFRAN -ODT) 4 MG disintegrating tablet Take 1 tablet (4 mg total) by mouth every 8 (eight) hours as needed for nausea or vomiting. (Patient not taking: Reported on 10/05/2024) 20 tablet 0   topiramate (TOPAMAX) 25 MG tablet Take 25 mg by mouth 2 (two) times daily.     No current facility-administered medications for this visit.     Musculoskeletal: Strength & Muscle Tone: within normal limits Gait &  Station: normal Patient leans: N/A   Psychiatric Specialty Exam: Review of Systems  Constitutional: Negative.   HENT: Negative.    Eyes: Negative.   Respiratory: Negative.    Cardiovascular: Negative.   Gastrointestinal: Negative.   Endocrine: Negative.   Genitourinary: Negative.   Musculoskeletal: Negative.   Skin: Negative.   Allergic/Immunologic: Negative.   Neurological: Negative.   Hematological: Negative.   Psychiatric/Behavioral:  Positive for dysphoric mood. The patient is nervous/anxious.      No vitals for this visit. Virtual visit.     General Appearance: Well Groomed  Eye Contact:  Good  Speech:  Clear and Coherent  Volume:  Normal  Mood:  Anxious  Affect:  Appropriate  Thought Process:  Coherent  Orientation:  Full (Time, Place, and Person)  Thought Content: Logical   Suicidal Thoughts:  No  Homicidal Thoughts:  No  Memory:  Immediate;   Good Recent;   Good Remote;   Good  Judgement:  Good  Insight:  Good  Psychomotor Activity:  Normal  Concentration:  Concentration: Good and Attention Span: Good  Recall:  Good  Fund of Knowledge: Good  Language: Good  Akathisia:  No  Handed:  Right  AIMS (if indicated): not done  Assets:  Desire for Improvement Financial Resources/Insurance Housing  ADL's:  Intact  Cognition: WNL  Sleep:  Good   Screenings: GAD-7    Flowsheet Row Office Visit from 07/07/2024 in [facility] Office Visit from 06/09/2024 in [facility] Office Visit from 05/25/2024 in [facility] Office Visit from 05/11/2024 in [facility] Office Visit from 04/29/2024 in [facility]  Total GAD-7 Score 15 8 15 8 21    PHQ2-9    Flowsheet Row Office Visit from 07/07/2024 in [facility] Office Visit from 06/09/2024 in  [facility] Office Visit from 05/25/2024 in [facility] Office Visit from 05/11/2024 in [facility] Office Visit from 04/29/2024 in [facility]  PHQ-2  Total Score 4 2 2 3 6   PHQ-9 Total Score 16 7 13 11 26    Flowsheet Row Office Visit from 07/07/2024 in [facility] Office Visit from 06/09/2024 in [facility] Office Visit from 05/25/2024 in [facility]  C-SSRS RISK CATEGORY No Risk No Risk No Risk     Assessment and Plan:  Assessment - Diagnosis: Bipolar disorder with severe depression (HCC) [F31.4]  2. Generalized anxiety disorder [F41.1]  3. Mixed obsessional thoughts and acts [F42.2]    Differential Diagnosis: Borderline Personality Disorder, recurrent abandonment   - Progress: Patient reporting that she her symptoms are greatly improving stating no auditory hallucinations as well as no sleeping issues stating that she is sleeping from 10 to 7 AM every night.  Patient is reporting improvement in her daily life but states that she has continued to have anxiety. - Risk Factors: Suicidal risk, worsening symptoms  Plan - Medications:  Continue Lamictal  to 150 mg by mouth once daily.  Patient was educated on the importance of monitoring for rashes and to stop taking medication should a rash develop and to contact the clinic.  Patient also reported nausea and vomiting as well as headache and sedation advised to take at night as well as to wait for medication on the symptoms, please notify clinic if symptoms last longer than 1 week. Continue Lybalvi  10mg /10mg  for hallucinations, and due to weight concerns as she has gained 30lb since starting Zyprexa .   Pt has been educated of Tardive Dyskinesia and abnormal movements, pt  to call the provider if she experience symptoms and to stop the medication.  Continue Clonidine  ER 0.2mg  once daily before bed, for ADHD like symptoms utilizing non-stimulant due to Bipolar diagnosis, pt has been educated of the risk of hypotension and instructed to chjeck BP first if she feels dizzy, sick, or lethargic.  4.  Continue Taking Hydroxyzine  TID as needed for anxiety.   - Psychotherapy: Patient placed on a wait list here at [facility] and is waiting for in person sessions. - Education: Patient educated on importance of monitoring for rashes with Lamictal .  Patient educated on suicide risk in which she agrees that she will call 911 or go to emergency department should she have suicidal thoughts with or without a plan.  To educate on behaviors and to continue journaling and monitoring of her behaviors regarding texting which she states has been pushing people away.  Advised to follow the recommendation and treatment plan of the therapists while working around these OCD tendencies and behaviors. - Follow-Up: Patient will follow up in 2 weeks - Referrals: [facility] for ADHD testing - Safety Planning: Patient was educated on her depression and to notify the clinic should she have worsening symptoms.  Patient also notified that should she have suicidal ideation with or without a plan to call 911 or go to the closest emergency department.  Patient denies having a firearm within the home.  Patient/Guardian was advised Release of Information must be obtained prior to any record release in order to collaborate their care with an outside provider. Patient/Guardian was advised if they have not already done so to contact the registration department to sign all necessary forms in order for us  to release information regarding their care.   Consent: Patient/Guardian gives verbal consent for treatment and assignment of benefits for services provided during this visit. Patient/Guardian expressed understanding  and agreed to proceed.    Wendy  Jama Der, NP 11/30/2024, 2:08 PM

## 2024-12-08 ENCOUNTER — Ambulatory Visit: Admitting: Professional Counselor

## 2024-12-08 DIAGNOSIS — F411 Generalized anxiety disorder: Secondary | ICD-10-CM

## 2024-12-08 DIAGNOSIS — F314 Bipolar disorder, current episode depressed, severe, without psychotic features: Secondary | ICD-10-CM | POA: Diagnosis not present
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
## 2024-12-14 ENCOUNTER — Encounter: Payer: Self-pay | Admitting: Psychiatry

## 2024-12-14 ENCOUNTER — Ambulatory Visit: Admitting: Psychiatry

## 2024-12-14 VITALS — BP 142/88 | HR 72 | Temp 98.0°F | Ht 64.0 in | Wt 185.0 lb

## 2024-12-14 DIAGNOSIS — F411 Generalized anxiety disorder: Secondary | ICD-10-CM

## 2024-12-14 DIAGNOSIS — F314 Bipolar disorder, current episode depressed, severe, without psychotic features: Secondary | ICD-10-CM | POA: Diagnosis not present

## 2024-12-14 DIAGNOSIS — F422 Mixed obsessional thoughts and acts: Secondary | ICD-10-CM | POA: Diagnosis not present

## 2024-12-14 MED ORDER — LISDEXAMFETAMINE DIMESYLATE 10 MG PO CAPS: 10.0000 mg | ORAL_CAPSULE | Freq: Every day | ORAL | 0 refills | Status: DC

## 2024-12-14 NOTE — Progress Notes (Signed)
 BH MD/PA/NP OP Progress Note  12/14/2024 9:54 AM Wendy Holmes  MRN:  [id number]  Chief Complaint:  Chief Complaint  Patient presents with   Follow-up   HPI: 38 year old female presenting [facility] for follow-up.  Patient reports that she is doing well but states that she is still anxious and reports that she is looking forward to this holiday season.  Patient reports that she is still having a lot of anxiety regarding the fact that she cannot state still or stay on task and states that she cannot complete many tasks around the house.  Patient reports that she is having issues with concentration and focus and reports that the clonidine  has not helped but has helped with the hyperactivity.  Patient reports all medications are going well stating that she is sleeping well and states that she would like to work on her focus and concentration.  Due to risk of possible activation of bipolar disorder patient has been explained that we will have to start a low-dose and we can explore a low med trial of stimulants as she has already tried clonidine  and Strattera.  Patient reports that she needs some kind of relief from her poor concentration and focus that home and reports she would like to try ADHD medications.  Patient is in agreement with treatment plan.  Patient with no other question concerns.  Patient is to follow-up with this provider at the next practice.  No follow-up needed. Visit Diagnosis:    ICD-10-CM   1. Bipolar disorder with severe depression (HCC)  F31.4     2. Generalized anxiety disorder  F41.1     3. Mixed obsessional thoughts and acts  F42.2       Past Psychiatric History: Previous Psych Hospitalizations: Denies   Outpatient treatment: Seen a psychiatrist in 2017, unable to recall the name, currently being manages with primary care with [facility].   Medications Current: -Lamictal  150mg  once daily, for bipolar depression -Lybalvi  10mg /10mg  once daily, for hallucinations.   -Hydroxyzine  25mg  TID as needed for anxiety.  -Clonidine  ER 0.2mg , for ADHD   Medication Trials: - Latuda 40mg , side effects, stopped taking after 8 months, nausea vomiting - Seroquel - 2013, took 2 doses after waking up after taking medications and becoming violent. - Klonopin - 2018, took too many doses in one day, and then encountered stressors and attempted to take a overdose to commit suicide, in which she did not go to the ER, but she reports she took a handful.  -Gabapentin  300mg  BID, 07/20/24, poor response - Cogentin  0,5mg  BID, 07/20/24, oversedating -Zyprexa  7.5mg , weight gain 08/04/24   Suicide & Violence: - Previous suicide attempt in 2018, did not go to the hospital.   Psychotherapy: Not currently participating in therapy.     Legal: Denies  Past Medical History:  Past Medical History:  Diagnosis Date   Anxiety    Asthma    Bipolar 1 disorder (HCC)    CIN III (cervical intraepithelial neoplasia III)    Depression    History of LEEP (loop electrosurgical excision procedure) of cervix complicating pregnancy, second trimester    2012   Vaginal Pap smear, abnormal     Past Surgical History:  Procedure Laterality Date   CHOLECYSTECTOMY     TONSILLECTOMY     TYMPANOSTOMY TUBE PLACEMENT     childhood   URETERAL STENT PLACEMENT      Family Psychiatric History: No additional  Family History: History reviewed. No pertinent family history.  Social History:  Social History   Socioeconomic History   Marital status: Divorced    Spouse name: Not on file   Number of children: 2   Years of education: 12   Highest education level: Some college, no degree  Occupational History   Not on file  Tobacco Use   Smoking status: Former    Current packs/day: 0.00    Types: Cigarettes    Quit date: 06/10/2020    Years since quitting: 4.5   Smokeless tobacco: Never  Vaping Use   Vaping status: Never Used  Substance and Sexual Activity   Alcohol use: Not Currently     Alcohol/week: 3.0 standard drinks of alcohol    Types: 3 Standard drinks or equivalent per week   Drug use: Not Currently    Types: Marijuana, Cocaine   Sexual activity: Not Currently    Partners: Male    Birth control/protection: Implant  Other Topics Concern   Not on file  Social History Narrative   Not on file   Social Drivers of Health   Tobacco Use: Medium Risk (12/14/2024)   Patient History    Smoking Tobacco Use: Former    Smokeless Tobacco Use: Never    Passive Exposure: Not on file  Financial Resource Strain: High Risk (12/08/2024)   Overall Financial Resource Strain (CARDIA)    Difficulty of Paying Living Expenses: Very hard  Food Insecurity: No Food Insecurity (12/08/2024)   Epic    Worried About Radiation Protection Practitioner of Food in the Last Year: Never true    Ran Out of Food in the Last Year: Never true  Transportation Needs: No Transportation Needs (12/08/2024)   Epic    Lack of Transportation (Medical): No    Lack of Transportation (Non-Medical): No  Physical Activity: Insufficiently Active (12/08/2024)   Exercise Vital Sign    Days of Exercise per Week: 7 days    Minutes of Exercise per Session: 10 min  Stress: Stress Concern Present (12/08/2024)   [employer] of Occupational Health - Occupational Stress Questionnaire    Feeling of Stress: Very much  Social Connections: Socially Isolated (12/08/2024)   Social Connection and Isolation Panel    Frequency of Communication with Friends and Family: More than three times a week    Frequency of Social Gatherings with Friends and Family: Never    Attends Religious Services: Never    Database Administrator or Organizations: No    Attends Banker Meetings: Never    Marital Status: Divorced  Depression (PHQ2-9): High Risk (12/08/2024)   Depression (PHQ2-9)    PHQ-2 Score: 13  Alcohol Screen: Low Risk (12/08/2024)   Alcohol Screen    Last Alcohol Screening Score (AUDIT): 1  Housing: High Risk (12/08/2024)    Epic    Unable to Pay for Housing in the Last Year: Yes    Number of Times Moved in the Last Year: 0    Homeless in the Last Year: No  Utilities: Not At Risk (12/08/2024)   Epic    Threatened with loss of utilities: No  Health Literacy: Adequate Health Literacy (12/08/2024)   B1300 Health Literacy    Frequency of need for help with medical instructions: Never    Allergies: Allergies[1]  Metabolic Disorder Labs: No results found for: HGBA1C, MPG No results found for: PROLACTIN No results found for: CHOL, TRIG, HDL, CHOLHDL, VLDL, LDLCALC Lab Results  Component Value Date   TSH 2.16 08/13/2012    Therapeutic Level Labs: No results found for:  LITHIUM No results found for: VALPROATE No results found for: CBMZ  Current Medications: Current Outpatient Medications  Medication Sig Dispense Refill   albuterol  (PROVENTIL ) (2.5 MG/3ML) 0.083% nebulizer solution Take 3 mLs (2.5 mg total) by nebulization every 6 (six) hours as needed for wheezing or shortness of breath. 75 mL 12   amLODipine (NORVASC) 5 MG tablet Take 5 mg by mouth daily.     cloNIDine  HCl (KAPVAY ) 0.1 MG TB12 ER tablet Take 2 tablets (0.2 mg total) by mouth at bedtime. 60 tablet 2   cyclobenzaprine  (FLEXERIL ) 5 MG tablet Take 5 mg by mouth once as needed for muscle spasms.     etonogestrel  (NEXPLANON ) 68 MG IMPL implant 1 each by Subdermal route once.     hydrOXYzine  (ATARAX ) 25 MG tablet Take 1 tablet (25 mg total) by mouth 3 (three) times daily. 90 tablet 2   lamoTRIgine  (LAMICTAL ) 150 MG tablet Take 1 tablet (150 mg total) by mouth daily. 30 tablet 2   loperamide  (IMODIUM ) 2 MG capsule Take 1 capsule (2 mg total) by mouth 4 (four) times daily as needed for diarrhea or loose stools. (Patient not taking: Reported on 10/05/2024) 12 capsule 0   methylPREDNISolone  (MEDROL  DOSEPAK) 4 MG TBPK tablet Take 6 pills on day one then decrease by 1 pill each day 21 tablet 0   OLANZapine -Samidorphan  (LYBALVI ) 10-10 MG TABS Take 1 tablet by mouth at bedtime. 30 tablet 2   ondansetron  (ZOFRAN -ODT) 4 MG disintegrating tablet Take 1 tablet (4 mg total) by mouth every 8 (eight) hours as needed for nausea or vomiting. (Patient not taking: Reported on 10/05/2024) 20 tablet 0   topiramate (TOPAMAX) 25 MG tablet Take 25 mg by mouth 2 (two) times daily.     No current facility-administered medications for this visit.     Musculoskeletal: Strength & Muscle Tone: within normal limits Gait & Station: normal Patient leans: N/A   Psychiatric Specialty Exam: Review of Systems  Constitutional: Negative.   HENT: Negative.    Eyes: Negative.   Respiratory: Negative.    Cardiovascular: Negative.   Gastrointestinal: Negative.   Endocrine: Negative.   Genitourinary: Negative.   Musculoskeletal: Negative.   Skin: Negative.   Allergic/Immunologic: Negative.   Neurological: Negative.   Hematological: Negative.   Psychiatric/Behavioral:  Positive for dysphoric mood. The patient is nervous/anxious.      No vitals for this visit. Virtual visit.     General Appearance: Well Groomed  Eye Contact:  Good  Speech:  Clear and Coherent  Volume:  Normal  Mood:  Anxious  Affect:  Appropriate  Thought Process:  Coherent  Orientation:  Full (Time, Place, and Person)  Thought Content: Logical   Suicidal Thoughts:  No  Homicidal Thoughts:  No  Memory:  Immediate;   Good Recent;   Good Remote;   Good  Judgement:  Good  Insight:  Good  Psychomotor Activity:  Normal  Concentration:  Concentration: Good and Attention Span: Good  Recall:  Good  Fund of Knowledge: Good  Language: Good  Akathisia:  No  Handed:  Right  AIMS (if indicated): not done  Assets:  Desire for Improvement Financial Resources/Insurance Housing  ADL's:  Intact  Cognition: WNL  Sleep:  Good   Screenings: GAD-7    Advertising Copywriter from 12/08/2024 in [facility] Visit  from 07/07/2024 in [facility] Office Visit from 06/09/2024 in [facility] Office Visit from  05/25/2024 in [facility] Office Visit from 05/11/2024 in [facility]  Total GAD-7 Score 12 15 8 15 8    PHQ2-9    Flowsheet Row Counselor from 12/08/2024 in [facility] Office Visit from 07/07/2024 in [facility] Office Visit from 06/09/2024 in [facility] Office Visit from 05/25/2024 in [facility] Office Visit from 05/11/2024 in [facility]  PHQ-2 Total Score 3 4 2 2 3   PHQ-9 Total Score 13 16 7 13 11    Flowsheet Row Counselor from 12/08/2024 in [facility] Office Visit from 07/07/2024 in [facility] Office Visit from 06/09/2024 in [facility]  C-SSRS RISK CATEGORY No Risk No Risk No Risk     Assessment and Plan:  Assessment - Diagnosis: Bipolar disorder with severe depression (HCC) [F31.4]  2. Generalized anxiety disorder [F41.1]  3. Mixed obsessional thoughts and acts [F42.2]    Differential Diagnosis: Borderline Personality Disorder, recurrent abandonment   - Progress: Patient reporting that she her symptoms are greatly improving stating no auditory hallucinations as well as no sleeping issues stating that she is sleeping from 10 to 7 AM every night.  Patient is reporting improvement in her daily life but states that she has continued to have anxiety. - Risk Factors: Suicidal risk, worsening symptoms  Plan - Medications:  Continue Lamictal  to 150 mg by mouth once daily.  Patient was educated on the importance of  monitoring for rashes and to stop taking medication should a rash develop and to contact the clinic.  Patient also reported nausea and vomiting as well as headache and sedation advised to take at night as well as to wait for medication on the symptoms, please notify clinic if symptoms last longer than 1 week. Continue Lybalvi  10mg /10mg  for hallucinations, and due to weight concerns as she has gained 30lb since starting Zyprexa .   Pt has been educated of Tardive Dyskinesia and abnormal movements, pt to call the provider if she experience symptoms and to stop the medication.  Continue Clonidine  ER 0.2mg  once daily before bed, for ADHD like symptoms utilizing non-stimulant due to Bipolar diagnosis, pt has been educated of the risk of hypotension and instructed to chjeck BP first if she feels dizzy, sick, or lethargic.  Start Vyvanse  10mg  once daily.  4.  Continue Taking Hydroxyzine  TID as needed for anxiety.   - Psychotherapy: Patient placed on a wait list here at [facility] and is waiting for in person sessions. - Education: Patient educated on importance of monitoring for rashes with Lamictal .  Patient educated on suicide risk in which she agrees that she will call 911 or go to emergency department should she have suicidal thoughts with or without a plan.  To educate on behaviors and to continue journaling and monitoring of her behaviors regarding texting which she states has been pushing people away.  Advised to follow the recommendation and treatment plan of the therapists while working around these OCD tendencies and behaviors. - Follow-Up: Patient will follow up in 2 weeks - Referrals: [facility] for ADHD testing - Safety Planning: Patient was educated on her depression and to notify the clinic should she have worsening symptoms.  Patient also notified that should she have suicidal ideation with or without a plan to call 911 or go to the closest  emergency department.  Patient denies having a firearm within  the home.  Patient/Guardian was advised Release of Information must be obtained prior to any record release in order to collaborate their care with an outside provider. Patient/Guardian was advised if they have not already done so to contact the registration department to sign all necessary forms in order for us  to release information regarding their care.   Consent: Patient/Guardian gives verbal consent for treatment and assignment of benefits for services provided during this visit. Patient/Guardian expressed understanding and agreed to proceed.    Dorn Jama Der, NP 12/14/2024, 9:54 AM     [1] No Known Allergies

## 2024-12-21 ENCOUNTER — Ambulatory Visit: Admitting: Professional Counselor

## 2024-12-28 ENCOUNTER — Other Ambulatory Visit: Payer: Self-pay | Admitting: Psychiatry

## 2024-12-28 MED ORDER — LYBALVI 10-10 MG PO TABS: 1.0000 | ORAL_TABLET | Freq: Every day | ORAL | 2 refills | Status: AC

## 2024-12-28 MED ORDER — LISDEXAMFETAMINE DIMESYLATE 10 MG PO CAPS: 10.0000 mg | ORAL_CAPSULE | Freq: Every day | ORAL | 0 refills | Status: AC

## 2025-01-04 ENCOUNTER — Ambulatory Visit: Admitting: Professional Counselor

## 2025-01-18 ENCOUNTER — Ambulatory Visit: Admitting: Professional Counselor

## 2025-01-25 ENCOUNTER — Ambulatory Visit: Admitting: Professional Counselor
